# Patient Record
Sex: Female | Born: 1975
Health system: Southern US, Community
[De-identification: ages and names within clinical notes are randomized; demographics above are authoritative.]

## PROBLEM LIST (undated history)

## (undated) DIAGNOSIS — S82899A Other fracture of unspecified lower leg, initial encounter for closed fracture: Secondary | ICD-10-CM

## (undated) DIAGNOSIS — O88119 Amniotic fluid embolism in pregnancy, unspecified trimester: Secondary | ICD-10-CM

## (undated) DIAGNOSIS — N83209 Unspecified ovarian cyst, unspecified side: Secondary | ICD-10-CM

## (undated) DIAGNOSIS — D65 Disseminated intravascular coagulation [defibrination syndrome]: Secondary | ICD-10-CM

## (undated) DIAGNOSIS — G43909 Migraine, unspecified, not intractable, without status migrainosus: Secondary | ICD-10-CM

## (undated) HISTORY — PX: ARTHROSCOPIC REPAIR ACL: SUR80

## (undated) HISTORY — PX: DILATION AND CURETTAGE OF UTERUS: SHX78

## (undated) HISTORY — DX: Migraine, unspecified, not intractable, without status migrainosus: G43.909

## (undated) HISTORY — DX: Unspecified ovarian cyst, unspecified side: N83.209

## (undated) HISTORY — PX: OTHER SURGICAL HISTORY: SHX169

---

## 2006-05-14 ENCOUNTER — Ambulatory Visit: Payer: Self-pay | Admitting: Obstetrics & Gynecology

## 2006-05-18 ENCOUNTER — Ambulatory Visit (HOSPITAL_COMMUNITY): Admission: RE | Admit: 2006-05-18 | Discharge: 2006-05-18 | Payer: Self-pay | Admitting: Obstetrics & Gynecology

## 2006-05-28 ENCOUNTER — Ambulatory Visit: Payer: Self-pay | Admitting: Obstetrics & Gynecology

## 2006-06-04 ENCOUNTER — Ambulatory Visit (HOSPITAL_COMMUNITY): Admission: RE | Admit: 2006-06-04 | Discharge: 2006-06-04 | Payer: Self-pay | Admitting: Gynecology

## 2006-06-11 ENCOUNTER — Ambulatory Visit: Payer: Self-pay | Admitting: Family Medicine

## 2006-06-18 ENCOUNTER — Ambulatory Visit (HOSPITAL_COMMUNITY): Admission: RE | Admit: 2006-06-18 | Discharge: 2006-06-18 | Payer: Self-pay | Admitting: Gynecology

## 2006-07-09 ENCOUNTER — Ambulatory Visit: Payer: Self-pay | Admitting: Obstetrics & Gynecology

## 2006-07-09 ENCOUNTER — Ambulatory Visit (HOSPITAL_COMMUNITY): Admission: RE | Admit: 2006-07-09 | Discharge: 2006-07-09 | Payer: Self-pay | Admitting: Obstetrics & Gynecology

## 2006-07-09 ENCOUNTER — Encounter (INDEPENDENT_AMBULATORY_CARE_PROVIDER_SITE_OTHER): Payer: Self-pay | Admitting: Specialist

## 2006-07-09 ENCOUNTER — Ambulatory Visit: Payer: Self-pay | Admitting: Gynecology

## 2006-08-10 ENCOUNTER — Ambulatory Visit: Payer: Self-pay | Admitting: Gynecology

## 2008-01-20 ENCOUNTER — Ambulatory Visit: Payer: Self-pay | Admitting: Obstetrics & Gynecology

## 2008-01-20 ENCOUNTER — Encounter: Payer: Self-pay | Admitting: Obstetrics & Gynecology

## 2009-02-01 ENCOUNTER — Ambulatory Visit: Payer: Self-pay | Admitting: Obstetrics & Gynecology

## 2009-02-01 ENCOUNTER — Encounter: Payer: Self-pay | Admitting: Obstetrics & Gynecology

## 2009-02-05 ENCOUNTER — Ambulatory Visit (HOSPITAL_COMMUNITY): Admission: RE | Admit: 2009-02-05 | Discharge: 2009-02-05 | Payer: Self-pay | Admitting: Obstetrics & Gynecology

## 2009-03-05 ENCOUNTER — Emergency Department (HOSPITAL_COMMUNITY): Admission: EM | Admit: 2009-03-05 | Discharge: 2009-03-05 | Payer: Self-pay | Admitting: Emergency Medicine

## 2009-04-10 ENCOUNTER — Ambulatory Visit (HOSPITAL_BASED_OUTPATIENT_CLINIC_OR_DEPARTMENT_OTHER): Admission: RE | Admit: 2009-04-10 | Discharge: 2009-04-10 | Payer: Self-pay | Admitting: Specialist

## 2009-10-18 ENCOUNTER — Ambulatory Visit: Payer: Self-pay | Admitting: Obstetrics & Gynecology

## 2009-10-19 ENCOUNTER — Encounter: Payer: Self-pay | Admitting: Obstetrics & Gynecology

## 2009-10-19 LAB — CONVERTED CEMR LAB: TSH: 1.073 microintl units/mL (ref 0.350–4.500)

## 2009-10-25 ENCOUNTER — Ambulatory Visit: Payer: Self-pay | Admitting: Obstetrics and Gynecology

## 2009-10-25 ENCOUNTER — Encounter: Payer: Self-pay | Admitting: Obstetrics & Gynecology

## 2009-10-25 LAB — CONVERTED CEMR LAB
Glucose, 2 hour: 94 mg/dL (ref 70–139)
Glucose, Fasting: 89 mg/dL (ref 70–99)

## 2010-07-10 ENCOUNTER — Ambulatory Visit: Payer: Self-pay | Admitting: Obstetrics and Gynecology

## 2010-07-15 ENCOUNTER — Ambulatory Visit: Payer: Self-pay | Admitting: Obstetrics and Gynecology

## 2010-07-15 LAB — CONVERTED CEMR LAB
Antibody Screen: NEGATIVE
Basophils Relative: 0 % (ref 0–1)
Eosinophils Absolute: 0.1 10*3/uL (ref 0.0–0.7)
Eosinophils Relative: 1 % (ref 0–5)
Hemoglobin: 13.6 g/dL (ref 12.0–15.0)
Hepatitis B Surface Ag: NEGATIVE
MCHC: 32.7 g/dL (ref 30.0–36.0)
Monocytes Relative: 6 % (ref 3–12)
Neutrophils Relative %: 74 % (ref 43–77)
Rubella: 31.6 intl units/mL — ABNORMAL HIGH

## 2010-08-13 ENCOUNTER — Ambulatory Visit: Payer: Self-pay | Admitting: Family Medicine

## 2010-09-05 ENCOUNTER — Ambulatory Visit (HOSPITAL_COMMUNITY): Admission: RE | Admit: 2010-09-05 | Discharge: 2010-09-05 | Payer: Self-pay | Admitting: Obstetrics & Gynecology

## 2010-09-12 ENCOUNTER — Ambulatory Visit: Payer: Self-pay | Admitting: Obstetrics & Gynecology

## 2010-10-10 ENCOUNTER — Ambulatory Visit: Payer: Self-pay | Admitting: Obstetrics & Gynecology

## 2010-11-05 ENCOUNTER — Ambulatory Visit: Payer: Self-pay | Admitting: Family Medicine

## 2010-11-05 ENCOUNTER — Encounter: Payer: Self-pay | Admitting: Obstetrics & Gynecology

## 2010-11-05 LAB — CONVERTED CEMR LAB
HCT: 34.7 % — ABNORMAL LOW (ref 36.0–46.0)
Hemoglobin: 11 g/dL — ABNORMAL LOW (ref 12.0–15.0)
MCHC: 31.7 g/dL (ref 30.0–36.0)
WBC: 10.7 10*3/uL — ABNORMAL HIGH (ref 4.0–10.5)

## 2010-11-19 ENCOUNTER — Ambulatory Visit: Payer: Self-pay | Admitting: Obstetrics & Gynecology

## 2010-12-10 ENCOUNTER — Ambulatory Visit: Payer: Self-pay | Admitting: Obstetrics & Gynecology

## 2010-12-14 ENCOUNTER — Inpatient Hospital Stay (HOSPITAL_COMMUNITY): Admission: AD | Admit: 2010-12-14 | Payer: Self-pay | Admitting: Family Medicine

## 2010-12-15 DIAGNOSIS — D65 Disseminated intravascular coagulation [defibrination syndrome]: Secondary | ICD-10-CM

## 2010-12-15 HISTORY — DX: Disseminated intravascular coagulation (defibrination syndrome): D65

## 2010-12-26 ENCOUNTER — Ambulatory Visit
Admission: RE | Admit: 2010-12-26 | Discharge: 2010-12-26 | Payer: Self-pay | Source: Home / Self Care | Attending: Obstetrics & Gynecology | Admitting: Obstetrics & Gynecology

## 2011-01-09 ENCOUNTER — Ambulatory Visit
Admission: RE | Admit: 2011-01-09 | Discharge: 2011-01-09 | Payer: Self-pay | Source: Home / Self Care | Attending: Obstetrics & Gynecology | Admitting: Obstetrics & Gynecology

## 2011-01-09 ENCOUNTER — Inpatient Hospital Stay (HOSPITAL_COMMUNITY)
Admission: AD | Admit: 2011-01-09 | Discharge: 2011-01-11 | Payer: Self-pay | Source: Home / Self Care | Attending: Obstetrics & Gynecology | Admitting: Obstetrics & Gynecology

## 2011-01-09 LAB — CBC: MCV: 84.1 fL (ref 78.0–100.0)

## 2011-01-13 ENCOUNTER — Ambulatory Visit
Admission: RE | Admit: 2011-01-13 | Discharge: 2011-01-13 | Payer: Self-pay | Source: Home / Self Care | Admitting: Obstetrics & Gynecology

## 2011-01-14 ENCOUNTER — Ambulatory Visit: Admit: 2011-01-14 | Payer: Self-pay | Admitting: Obstetrics & Gynecology

## 2011-02-03 ENCOUNTER — Inpatient Hospital Stay (HOSPITAL_COMMUNITY)
Admission: AD | Admit: 2011-02-03 | Payer: BC Managed Care – PPO | Source: Ambulatory Visit | Admitting: Obstetrics & Gynecology

## 2011-02-25 ENCOUNTER — Ambulatory Visit: Payer: BC Managed Care – PPO | Admitting: Obstetrics and Gynecology

## 2011-03-07 NOTE — Assessment & Plan Note (Signed)
Rhonda Adams, Rhonda Adams NO.:  [id number]  MEDICAL RECORD NO.:  [id number]           PATIENT TYPE:  LOCATION:  [facility] at [facility]           FACILITY:  PHYSICIAN:  Allie Bossier, MD        DATE OF BIRTH:  Aug 22, 1976  DATE OF SERVICE:                                 CLINIC NOTE  DATE OF SERVICE:  February 25, 2011.  Rhonda Adams is a 35 year old married white G1, now P1 who is now 6 weeks postpartum status post NSVD with delivery of a 7-pound 10-ounce daughter at approximately 36-1/2 weeks estimated gestational age.  At the time of her last prenatal visit, she was 213 pounds, she now weighs 182.  She has no complaints.  She is pumping breast milk and feeding the breast milk to the baby in a bottle.  She has not had intercourse yet and thinks that she would like to have her next pregnancy in about a year. She would like to go back on birth control pills, which she used successfully in the past.  Please note that she was tried on a Progestin- only pill in the past, but did not like that due to spotting.  She denies baby blues, although she does feel some fatigue.  Baby wakes up approximately one time a night, but they do not go to bed until at least in the night, every night.  She is looking forward to returning back to work, works at the [employer].  Her bowel and bladder function have returned to normal.  She essentially has not had a period, only had occasional spot of blood since her lochia stopped.  PHYSICAL EXAMINATION:  VITAL SIGNS:  She is now 182 pounds, her blood pressure is 108/72, and her pulse is 90. GENITOURINARY:  Her vulva is well healed from her laceration.  Her bimanual exam reveals a uterus that is midplane and mobile, nontender. It is in the upper limits of normal for normal size.  Her adnexa are nontender.  No masses.  ASSESSMENT/PLAN:  Postpartum exam.  She is doing well.  I have given her a prescription for generic Imelda Pillow, to be used  on a daily basis.  She will come back in September for her annual exam, when her Pap smear is due or earlier as necessary.  I have encouraged her to continue to lose weight to reach her ideal body weight.     Allie Bossier, MD    MCD/MEDQ  D:  02/25/2011  T:  02/26/2011  Job:  [id number]

## 2011-03-26 LAB — POCT PREGNANCY, URINE: Preg Test, Ur: NEGATIVE

## 2011-04-29 NOTE — Op Note (Signed)
Rhonda Adams, DEWALT NO.:  [id number]   MEDICAL RECORD NO.:  [id number]          PATIENT TYPE:  AMB   LOCATION:  [facility]                         FACILITY:  [facility]   PHYSICIAN:  Erasmo Leventhal, M.D.DATE OF BIRTH:  [birth year]   DATE OF PROCEDURE:  04/10/2009  DATE OF DISCHARGE:                               OPERATIVE REPORT   PREOPERATIVE DIAGNOSIS:  Left knee torn anterior cruciate ligament,  possible torn meniscus.   POSTOPERATIVE DIAGNOSIS:  Left knee complete rupture of anterior  cruciate ligament tear, irreparable tear posterior horn lateral  meniscus.   PROCEDURE:  Left knee exam under anesthesia, arthroscopic assisted  allograft anterior cruciate ligament reconstruction, partial lateral  meniscectomy.   SURGEON:  Erasmo Leventhal, M.D.   ASSISTANT:  Jamelle Rushing, P.A.   ANESTHESIA:  Femoral nerve block with general, Dr. Jean Rosenthal.   ESTIMATED BLOOD LOSS:  Less than 10 mL.   DRAINS:  None.   COMPLICATIONS:  None.   TOURNIQUET TIME:  65 minutes 300 mmHg.   DISPOSITION:  PACU stable.   DRAINS:  None.   OPERATIVE DETAILS:  The patient was counseled in the holding area.  Correct side was identified.  IV was started and femoral nerve block was  administered.  Antibiotics were given on the way to the operating room.  There she was placed under general anesthesia.  All extremities well-  padded and bumped.  The left knee was examined.  She had 5 degrees  recurvatum, flexion 140.  She had only 130, she had 2+ Lachman's, soft  endpoint, 2+ anterior drawer, soft endpoint, markedly positive flexion  rotation drawer, collateral ligaments were stable to varus-valgus  stress, PCL and posterolateral corner were stable.  She was elevated.  She was prepped with DuraPrep and draped in a sterile fashion.  Exsanguinated with Esmarch, tourniquet was inflated to 300 mmHg.  Proximal medial, anteromedial and anterolateral portals were  established.   Diagnostic arthroscopy revealed the following findings,  patellofemoral joint __________ normal __________  normal tracking.  AC  was completely ruptured.  PCL was intact.  ACL fibers debrided.  Notchplasty performed in a standard fashion.  Medial side inspected.  Medial meniscus was found to be intact.  The __________ very small  partial undersurface tear posterior horn but was completely stable and  did not require intervention.  Articular cartilage was healthy.  Lateral  side healthy articular cartilage, but there was irreparable tear of the  posterior lateral meniscus in the white-white zone.  Utilizing baskets  motorized shaver smoothed down nice edge removing very small amount of  meniscal tissue.   Lateral incision made through the skin and subcutaneous tissue.  Femoral  guide was placed.  Iliotibial band was opened. Guide pin was placed  __________  the ACL footprint on the femur.  Retro reamer was attached,  retro tunnel was performed.  We had chosen 8  size appropriate allograft  on back table and Oneida Alar, PA-C prepared it.  Tibialis anterior  allograft.  Following this, small incision made __________ aspect of the  tibia.  Tibial guide  was placed.  A retro tunnel was performed.  Both  tunnels were rasped and debrided and dilated appropriately.  Knee was  copiously irrigated.  At this time the graft was moved to the knee.  I  securely fixed the femoral side with an Arthrex bioabsorbable screw  placed in parallel fashion.   Distally put the knee through range of motion.  There is no notch  impingement.  The graft had excellent __________ movement pattern.  We  allowed the graft to creep.  At this point in time __________ tibial  graft, had 30 degrees of flexion and rotation.  Pulling distally we  securely fixed it with an Arthrex bioabsorbable screw placed in parallel  fashion and supplemented that with a push lock anchor for backup  technique.  We now had a well-fixed graft  on both femoral and tibial  sides, knee had full motion, was clinically stable and graft had  excellent orientation tension.   The __________ arthroscopic equipment was removed.  On the lateral side  the IT band closed with Vicryl, subcu Vicryl, skin with subcutaneous  Vicryl suture.  Portals with nylon, __________  subcu Vicryl, skin with  __________ suture.  Another 10 mL 1% lidocaine epinephrine and 4 mg of  morphine sulfate was injected into the knee joint.  Sterile dressing  applied to the knee.  Tourniquet deflated.  Normal circulation foot and  ankle at end of case.  Another gram of Ancef was given intravenously.  TED hose was applied with an ice pack and knee immobilizer in full  extension.  She tolerated procedure well with no complications or  problems.  She is awakened taken from operating table in stable  condition.  Sponge and needle count were correct.  __________  help with  surgical technique, graft preparation throughout the entire surgical  procedure, Mr. Oneida Alar, PA-C assistance was needed.           ______________________________  Erasmo Leventhal, M.D.     RAC/MEDQ  D:  04/10/2009  T:  04/10/2009  Job:  [id number]

## 2011-04-29 NOTE — Assessment & Plan Note (Signed)
NAME:  JALYIAH, SHELLEY NO.:  [id number]   MEDICAL RECORD NO.:  [id number]          PATIENT TYPE:  POB   LOCATION:  [facility] at [facility]         [facility]ACILITY:  [facility]   PHYSICIAN:  Elsie Lincoln, MD      DATE OF BIRTH:  1976-08-08   DATE OF SERVICE:                                  CLINIC NOTE   The patient is a 35 year old female, G1 para 0-0-1-0 who presents for  her annual exam.  She has no complaints.  She did have the miscarriage  last year and D&C.  She is still trying to get pregnant but not with any  intervention. She has sex when she feels like it but is not using birth  control.  She is not on any multivitamin which I thought that she should  take just in case she becomes pregnant she has enough folic acid in her  system.  She has regular menses.  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  Her husband is an Pensions consultant and employed.   PAST MEDICAL HISTORY:  Migraines.   PAST SURGICAL HISTORY:  None.   GYN HISTORY:  No changes.  The patient again is BRCA 1 and 2 negative  after being tested from when her mother died of breast cancer.   FAMILY HISTORY:  No changes.   MEDICATIONS:  Occasional Excedrin.   ALLERGIES:  None.   REVIEW OF SYSTEMS:  Review of symptoms completely negative.  All 13  points in review are negative.   PHYSICAL EXAMINATION:  VITALS:  Pulse 92, blood pressure 111/81, weight  184, height 5 feet 5 inches.  GENERAL:  Well-nourished, well-developed. No apparent distress.  HEENT:  Normocephalic, atraumatic.  Thyroid no masses.  LUNGS:  Clear to auscultation bilaterally.  HEART:  Regular rate and rhythm.  BREASTS:  No masses, no lymphadenopathy.  Self-breast exam reviewed.  ABDOMEN:  Nontender.  No organomegaly.  No hernia.  GENITALIA:  Tanner five.  Vagina pink normal rugae.  Cervix closed,  nontender. Uterus anteverted, mobile, nontender. Adnexa no masses,  nontender.  RECTAL:   No hemorrhoids.  EXTREMITIES:  Nontender.   ASSESSMENT/PLAN:  A 35 year old female for yearly exam.  1. Pap smear.  2. Breast exam normal.  3. Start her on multivitamin daily.  4. Return to clinic in a year or sooner if needed           ______________________________  Elsie Lincoln, MD     KL/MEDQ  D:  01/20/2008  T:  01/21/2008  Job:  [id number]

## 2011-04-29 NOTE — Assessment & Plan Note (Signed)
NAME:  Rhonda Adams, KLUGH NO.:  [id number]   MEDICAL RECORD NO.:  [id number]          PATIENT TYPE:  POB   LOCATION:  [facility]         FACILITY:  [facility]   PHYSICIAN:  Allie Bossier, MD        DATE OF BIRTH:  04/09/1976   DATE OF SERVICE:                                  CLINIC NOTE   Ms. Klinger is a 35 year old married white gravida 1, para 0, abortus 1,  who comes in here for her annual exam.  She complains of some right  lower quadrant pain that is different that her usual mittelschmerz did  monthly pain.  This was associated right when her periods began and she  is curious if she might have had a miscarriage.  She said that period  was late and she had agiant clot.  Her pregnancy test today is  negative.  I have explained that there is no way to determine if she was  pregnant and has now completely lost that or not.  She is trying to  conceive.  She is on a multivitamin daily and is having unprotected  intercourse with her husband.   PAST MEDICAL HISTORY:  Mild migraine (mild migraine obesity).   PAST SURGICAL HISTORY:  She had a miscarriage and she had a D and C,  which she had a spontaneous miscarriage.   ALLERGIES:  No known drug allergies.   SOCIAL HISTORY:  She rarely drinks alcohol.   FAMILY HISTORY:  Significant for breast cancer in her mother, who was  diagnosed when her mother was pregnant with [place].  Her mother died when  Jeraldine was 66 months old.   REVIEW OF SYSTEMS:  She is married for 7 years.  She denies dyspareunia.  She is a Comptroller at [employer]  The remainder review of systems is  negative.   MEDICATIONS:  She takes a multivitamin daily and Excedrin headache  medicine as necessary.   PHYSICAL EXAMINATION:  VITAL SIGNS:  Weight 198, height 5 feet 5, blood  pressure 116/75, and pulse 75.  HEENT:  Normal.  HEART:  Regular rate and rhythm.  BREAST:  Normal bilaterally.  ABDOMEN:  No hepatosplenomegaly palpable.  EXTERNAL  GENITALIA:  No lesions.  Cervix, nulliparous.  Ectropion is  noted.  No lesions.  BIMANUAL:  Normal size and shape anteverted mobile uterus.  Her adnexa  are not particularly large and particularly tender.  She might have a  slightly increased amount of tenderness on the right.   ASSESSMENT AND PLAN:  1. Annual exam.  Checked Pap smear.  Recommended self-breast and self      vulvar exams monthly.  2. Right lower quadrant pain.  I will check an ultrasound for      reassurance for her.  She will continue her multivitamin with folic      acid.      Allie Bossier, MD     MCD/MEDQ  D:  02/01/2009  T:  02/01/2009  Job:  [id number]

## 2011-04-29 NOTE — Assessment & Plan Note (Signed)
Rhonda Adams, TERADA NO.:  [id number]   MEDICAL RECORD NO.:  [id number]          PATIENT TYPE:  POB   LOCATION:  [facility]         [facility]ACILITY[facility]  [facility]   PHYSICIAN:  Elsie Lincoln, MD      DATE OF BIRTH:  [birth year]   DATE OF SERVICE:  10/18/2009                                  CLINIC NOTE   The patient is a 35 year old female who had a positive pregnancy test at  home on October 09, 2009, and started spotting heavily on October 12, 2009, and then had a much heavier bleed and cramping on the following  day.  This is most likely the patient's third miscarriage.  The second  documented miscarriage with positive pregnancy, but she had another  undocumented one in February, but had verify similar symptoms.  The  patient desires pregnancy, but has not had a formal Reproductive  Endocrinology evaluation.  She does get pregnant, but she cannot seem to  keep them.  She also does seem to have long periods between pregnancies.  The patient does seem now more amenable  to go into the REI and still  needs a specialist for just a general consultation to see all her  options, so she can lay out her plan over the next 10 years.  I did  refer to Dr. April Manson with [facility].  Hopefully, he will  sent me back his consult letter, so I can have this in my chart.  We did  discuss the tests for diabetes, TSH, possible genetic counseling, and  antiphospholipid testing for recurrent miscarriages.  These tests were  drawn today.  I think she is coming back later to have a GTT to rule out  diabetes.  The patient is not of Ashkenazi-Jewish descent, but it would  be important to know prior to consumption.  She is [place], but not  Jewish.  The patient does not seem depressed about the recent  miscarriage, but I do think that she does not want to become pregnant  and overall options explained to her.  The patient is not due for any  health care maintenance as her last  Pap smear was on February 01, 2009,  and within normal limits.           ______________________________  Elsie Lincoln, MD     KL/MEDQ  D:  10/18/2009  T:  10/19/2009  Job:  [id number]

## 2011-05-02 NOTE — Group Therapy Note (Signed)
NAME:  Rhonda Adams, Rhonda Adams NO.:  [id number]   MEDICAL RECORD NO.:  [id number]          PATIENT TYPE:  WOC   [facility]ATION[facility]  [facility]                   FACILITY:  [facility]   PHYSICIAN:  Elsie Lincoln, MD      DATE OF BIRTH:  [birth year]   DATE OF SERVICE:                                    CLINIC NOTE   HISTORY OF PRESENT ILLNESS:  Patient is a 35 year old nulliparous female who  presents for followup of ovarian cysts.  Patient has a longstanding  infertility history, and has had several negative ultrasounds, but  complaining of right lower quadrant pain at her doctor up in [place],  and they found incidentally an irregular cystic area on the left ovary  measuring 1.3 x 1.1 x 0.8 cm as well as multiple smaller follicular cysts.  There is nothing on the right other than follicular cysts with the largest  one under 1.0 cm.  The patient wants followup of this.  She is not  interested in doing any infertility treatments at this time.  She has had a  negative hCG 2-3 years ago, and her husband had a normal semen analysis.   PAST MEDICAL HISTORY:  Migraines.   PAST SURGICAL HISTORY:  Denies.   PAST GYNECOLOGICAL HISTORY:  History of a ruptured cyst as well as problems  described above.   MEDICATIONS:  Prenatal vitamins.   ALLERGIES:  None.   FAMILY HISTORY:  Father, high blood pressure; mother, breast cancer.  She  did have the BRCA1 and 2 genetic testing done, and she is negative.   REVIEW OF SYSTEMS:  A 13-point review of systems is positive for frequent  headaches and pain with intercourse.   SOCIAL HISTORY:  Negative for smoking.  She does have an occasional drink,  and she also drinks caffeinated beverages.  She does not do drugs.  She  lives in the area with her sister who is also a patient in the practice.   PHYSICAL EXAMINATION:  ABDOMEN:  Soft, nontender, nondistended.  No rebound.  No guarding.  No hernia.  Tanner 5.  GENITALIA:  Vagina pink.  Normal rugae.   Cervix nulliparous.  Uterus normal.  Adnexa:  No masses.  Nontender.  I cannot feel any cysts or enlarged areas.  Perineum intact.   ASSESSMENT:  A 35 year old female who wants followup on a very small ovarian  cyst on the left that was done in [place] on March 06, 2006.  We will order  a transvaginal ultrasound.  The patient is to come back in 2 weeks for  results.  Her Pap smear is up to date __________  March 2007, and we have a  copy of this.           ______________________________  Elsie Lincoln, MD     KL/MEDQ  D:  05/14/2006  T:  05/14/2006  Job:  [id number]

## 2011-05-02 NOTE — Op Note (Signed)
Rhonda Adams, Rhonda Adams                 ACCOUNT NO.:  [id number]   MEDICAL RECORD NO.:  [id number]          PATIENT TYPE:  AMB   LOCATION:                                [facility]   PHYSICIAN:  Ginger Carne, MD  DATE OF BIRTH:  [birth year]   DATE OF PROCEDURE:  07/09/2006  DATE OF DISCHARGE:                                 OPERATIVE REPORT   PREOPERATIVE DIAGNOSIS:  Ten week missed abortion.   POSTOPERATIVE DIAGNOSIS:  Ten week missed abortion.   PROCEDURE:  Aspiration, dilation and extraction.   SURGEON:  Dr. Mia Creek.   ASSISTANT:  None.   COMPLICATIONS:  None immediate.   ANESTHESIA:  MAC and local paracervical block.   FINDINGS:  Products of conception, 10-week size uterus.  Uterus, tubes and  ovaries otherwise unremarkable.  Both adnexa palpable, found to be normal.   SPECIMEN:  Products of conception sent to pathology.   OPERATIVE PROCEDURE:  The patient prepped in the usual fashion and placed in  lithotomy position.  Betadine solution used for antiseptic.  The patient  catheterized prior to procedure.  After tenaculum placed on the anterior lip  of the cervix, dilatation to accommodate a #8 suction cannula was then  followed by suction curettage.  No bleeding noted at end of the procedure.  All specimen removed, sent to pathology.  The patient returned to post  anesthesia recovery room in excellent condition.      Ginger Carne, MD  Electronically Signed     SHB/MEDQ  D:  07/09/2006  T:  07/09/2006  Job:  [id number]

## 2011-05-02 NOTE — Group Therapy Note (Signed)
NAME:  Rhonda Adams, Rhonda Adams NO.:  [id number]   MEDICAL RECORD NO.:  [id number]          PATIENT TYPE:  WOC   [facility]  [facility]                   FACILITY:  [facility]   PHYSICIAN:  Elsie Lincoln, MD      DATE OF BIRTH:  [birth year]   DATE OF SERVICE:                                    CLINIC NOTE   [facility] [place]   The patient is a 35 year old female who presents with amenorrhea.  She is  noted to have a positive pregnancy test on a urine here.  This is great  news, and she has gone through fertility issues in the past.  She was also  seen here for follow up of an ovarian cyst.  She had an ultrasound done on  May 18, 2006, which shows no ovarian cyst and obviously no intrauterine  pregnancy.  She is most likely about [redacted] weeks pregnant at this point.  So, we  will wait a week to get the ultrasound.  She is given ectopic and  miscarriage precautions.  She is also given a sample of Citracal prenatal  plus DHA and advised that no smoking, drinking or drugs.  If she gets  nauseated, try B6 over-the-counter or call us if it is worse.  The patient  is obviously very excited about this and will be coming back for an OB in  the next several weeks if it is a viable IUP.  If not, she will come back  sooner for workup of missed AB or an ectopic.           ______________________________  Elsie Lincoln, MD     KL/MEDQ  D:  05/28/2006  T:  05/28/2006  Job:  [id number]

## 2011-06-11 ENCOUNTER — Inpatient Hospital Stay (INDEPENDENT_AMBULATORY_CARE_PROVIDER_SITE_OTHER)
Admission: RE | Admit: 2011-06-11 | Discharge: 2011-06-11 | Disposition: A | Discharge status: Home / Self Care | Payer: BC Managed Care – PPO | Source: Ambulatory Visit | Attending: Family Medicine | Admitting: Family Medicine

## 2011-06-11 DIAGNOSIS — J069 Acute upper respiratory infection, unspecified: Secondary | ICD-10-CM

## 2012-04-27 ENCOUNTER — Other Ambulatory Visit: Payer: Self-pay | Admitting: Obstetrics & Gynecology

## 2012-04-29 NOTE — Telephone Encounter (Signed)
Called Rx

## 2012-06-11 ENCOUNTER — Other Ambulatory Visit: Payer: Self-pay | Admitting: *Deleted

## 2012-06-11 MED ORDER — FOLIC ACID 1 MG PO TABS: ORAL_TABLET | ORAL | Status: DC

## 2012-06-11 NOTE — Telephone Encounter (Signed)
Pharmacy requested refill of folic acid.

## 2012-07-23 ENCOUNTER — Ambulatory Visit (INDEPENDENT_AMBULATORY_CARE_PROVIDER_SITE_OTHER): Payer: BC Managed Care – PPO | Admitting: Obstetrics & Gynecology

## 2012-07-23 ENCOUNTER — Encounter: Payer: Self-pay | Admitting: Obstetrics & Gynecology

## 2012-07-23 VITALS — BP 102/68 | Wt 206.0 lb

## 2012-07-23 DIAGNOSIS — Z124 Encounter for screening for malignant neoplasm of cervix: Secondary | ICD-10-CM

## 2012-07-23 DIAGNOSIS — G43909 Migraine, unspecified, not intractable, without status migrainosus: Secondary | ICD-10-CM | POA: Insufficient documentation

## 2012-07-23 DIAGNOSIS — O09529 Supervision of elderly multigravida, unspecified trimester: Secondary | ICD-10-CM

## 2012-07-23 DIAGNOSIS — N83209 Unspecified ovarian cyst, unspecified side: Secondary | ICD-10-CM | POA: Insufficient documentation

## 2012-07-23 DIAGNOSIS — E669 Obesity, unspecified: Secondary | ICD-10-CM

## 2012-07-23 DIAGNOSIS — O209 Hemorrhage in early pregnancy, unspecified: Secondary | ICD-10-CM

## 2012-07-23 DIAGNOSIS — Z3480 Encounter for supervision of other normal pregnancy, unspecified trimester: Secondary | ICD-10-CM

## 2012-07-23 DIAGNOSIS — Z113 Encounter for screening for infections with a predominantly sexual mode of transmission: Secondary | ICD-10-CM

## 2012-07-23 DIAGNOSIS — O99210 Obesity complicating pregnancy, unspecified trimester: Secondary | ICD-10-CM

## 2012-07-23 DIAGNOSIS — O4690 Antepartum hemorrhage, unspecified, unspecified trimester: Secondary | ICD-10-CM

## 2012-07-23 NOTE — Progress Notes (Signed)
   Subjective:    Rhonda Adams is a G2P1 [redacted]w[redacted]d being seen today for her first obstetrical visit.  Her obstetrical history is significant for advanced maternal age, obesity in pregnancy, and h/o multiple miscarriages. She has also seen some pink "strings" with wiping on 3 occasions last week. She denies cramping. Patient does intend to breast feed. Pregnancy history fully reviewed.  Patient reports as above.  There were no vitals filed for this visit.  HISTORY: OB History    Grav Para Term Preterm Abortions TAB SAB Ect Mult Living   2 1             # Outc Date GA Lbr Len/2nd Wgt Sex Del Anes PTL Lv   1 CUR            2 PAR              Past Medical History  Diagnosis Date  . Ovarian cyst   . Migraine    Past Surgical History  Procedure Date  . Dilation and curettage of uterus   . Miscarriage    Family History  Problem Relation Age of Onset  . Cancer Mother     BREAST     Exam    Uterus:     Pelvic Exam:    Perineum: No Hemorrhoids   Vulva: normal   Vagina:  normal mucosa   pH:    Cervix: anteverted and ectropion noted   Adnexa: normal adnexa   Bony Pelvis: android  System: Breast:  normal appearance, no masses or tenderness   Skin: normal coloration and turgor, no rashes    Neurologic: oriented   Extremities: normal strength, tone, and muscle mass   HEENT PERRLA   Mouth/Teeth mucous membranes moist, pharynx normal without lesions   Neck supple   Cardiovascular: regular rate and rhythm   Respiratory:  appears well, vitals normal, no respiratory distress, acyanotic, normal RR, ear and throat exam is normal, neck free of mass or lymphadenopathy, chest clear, no wheezing, crepitations, rhonchi, normal symmetric air entry   Abdomen: soft, non-tender; bowel sounds normal; no masses,  no organomegaly   Urinary: urethral meatus normal      Assessment:    Pregnancy: G2P1 Patient Active Problem List  Diagnosis  . Ovarian cyst  . Migraine        Plan:      Initial labs drawn. Prenatal vitamins. Problem list reviewed. Genetic Screening discussed First Screen and Quad Screen: undecided. I have given her written information about the Harmony test. She thinks that they will decline. She is interested in having a consult with a nutritionist. I will get an u/s due to her h/o miscarriages and now seeing "pink" with wiping.  Ultrasound discussed; fetal survey: requested.  Follow up in 4 weeks.  Tulsi Crossett C. 07/23/2012

## 2012-07-24 LAB — OBSTETRIC PANEL
Basophils Relative: 1 % (ref 0–1)
Eosinophils Relative: 2 % (ref 0–5)
Hepatitis B Surface Ag: NEGATIVE
Lymphocytes Relative: 30 % (ref 12–46)
MCV: 81.9 fL (ref 78.0–100.0)
Monocytes Absolute: 0.3 10*3/uL (ref 0.1–1.0)
RBC: 4.91 MIL/uL (ref 3.87–5.11)
Rh Type: POSITIVE
WBC: 2.6 10*3/uL — ABNORMAL LOW (ref 4.0–10.5)

## 2012-07-30 ENCOUNTER — Other Ambulatory Visit: Payer: Self-pay | Admitting: Obstetrics & Gynecology

## 2012-07-30 ENCOUNTER — Ambulatory Visit (HOSPITAL_COMMUNITY)
Admission: RE | Admit: 2012-07-30 | Discharge: 2012-07-30 | Disposition: A | Discharge status: Home / Self Care | Payer: BC Managed Care – PPO | Source: Ambulatory Visit | Attending: Obstetrics & Gynecology | Admitting: Obstetrics & Gynecology

## 2012-07-30 DIAGNOSIS — O209 Hemorrhage in early pregnancy, unspecified: Secondary | ICD-10-CM | POA: Insufficient documentation

## 2012-07-30 DIAGNOSIS — O3680X0 Pregnancy with inconclusive fetal viability, not applicable or unspecified: Secondary | ICD-10-CM | POA: Insufficient documentation

## 2012-07-30 DIAGNOSIS — O30009 Twin pregnancy, unspecified number of placenta and unspecified number of amniotic sacs, unspecified trimester: Secondary | ICD-10-CM | POA: Insufficient documentation

## 2012-07-30 DIAGNOSIS — O2620 Pregnancy care for patient with recurrent pregnancy loss, unspecified trimester: Secondary | ICD-10-CM | POA: Insufficient documentation

## 2012-08-11 ENCOUNTER — Ambulatory Visit (INDEPENDENT_AMBULATORY_CARE_PROVIDER_SITE_OTHER): Payer: BC Managed Care – PPO | Admitting: Obstetrics & Gynecology

## 2012-08-11 VITALS — BP 134/86 | Wt 205.0 lb

## 2012-08-11 DIAGNOSIS — IMO0002 Reserved for concepts with insufficient information to code with codable children: Secondary | ICD-10-CM

## 2012-08-11 DIAGNOSIS — O2620 Pregnancy care for patient with recurrent pregnancy loss, unspecified trimester: Secondary | ICD-10-CM | POA: Insufficient documentation

## 2012-08-11 DIAGNOSIS — O021 Missed abortion: Secondary | ICD-10-CM

## 2012-08-11 NOTE — Progress Notes (Signed)
Transvaginal US shows collapsing gestational sac A and sac B with fetus CRL 7 weeks 4 days no FHM. Light bleeding noted. Discussed expectant management v. D&C or Cytotec, risks and benefits. She will consider options. Dx fetal demise.

## 2012-08-11 NOTE — Patient Instructions (Signed)
Miscarriage (Spontaneous Miscarriage)  A miscarriage is when you lose your baby before the twentieth week of pregnancy. Miscarriages happen in 15-20% of pregnancies. Most miscarriages happen in the first 13 weeks of the pregnancy. In medical terms, this is called a spontaneous miscarriage or early pregnancy loss. No further treatment is needed when the miscarriage is complete and all products of conception have been passed out of the body. You can begin trying for another pregnancy as soon as your caregiver says it is okay.  CAUSES    Most causes are not known.   Genetic problems like abnormal, not enough or too many chromosomes.   Infection of the cervix or uterus.   An abnormal shaped uterus, fibroid tumors or congenital abnormalities.   Hormone problems.   Medical problems.   Incompetent cervix, the tissue in the cervix is not strong enough to hold the pregnancy.   Smoking, too much alcohol use and illegal drugs.   Trauma.  SYMPTOMS    Bleeding or spotting from the vagina.   Cramping of the lower abdomen.   Passing of fluid from the vagina with or without cramps or pain.   Passing fetal tissue.  TREATMENT    Sometimes no further treatment is necessary if you pass all the tissue in the uterus.   If partial parts of the fetus or placenta remain in the body (incomplete miscarriage), tissue left behind may become infected. Usually a D and C (Dilatation and Curettage) suction or scrapping of the uterus is necessary to remove the remaining tissue in uterus. The procedure is only done when your caregiver knows that there is no chance for the pregnancy to continue. This is determined by a physical exam, a negative pregnancy test, blood tests and perhaps an ultrasound revealing a dead fetus or no fetus developing because a problem occurred at conception (when the sperm and egg unite).   Medications may be necessary, antibiotics if there is an infection or medications to contract the uterus if there is a  lot of bleeding.   If you have Rh negative blood and your partner is Rh positive, you will need a Rho-gam shot (an immune globulin vaccine). This will protect your baby from having Rh blood problems in future pregnancies.  HOME CARE INSTRUCTIONS    Your caregiver may order bed rest (up to the bathroom only). He or she may allow you to continue light activity. You may need to make arrangements for the care of children and for any other responsibilities.   Keep track of the number of pads you use each day and how soaked (saturated) they are. Record this information.   Do not use tampons. Do not douche or have sexual intercourse until approved by your caregiver.   Only take over-the-counter or prescription medicines for pain, discomfort or fever as directed by your caregiver.   Do not take aspirin because it can cause bleeding.   It is very important to keep all follow-up appointments for re-evaluations and continuing management.   Tell your caregiver if you are experiencing domestic violence.   Women who have an Rh negative blood type (i.e., A, B, AB, or O negative) need to receive a drug called Rh(D) immune globulin (RhoGam). This medicine helps protect future fetuses against problems that can occur if an Rh negative mother is carrying a baby who is Rh positive.   If you and/or your partner are having problems with guilt or grieving, talk to your caregiver or seek counseling to help   you cope with the pregnancy loss. Allow enough time to grieve before trying to get pregnant again.  SEEK IMMEDIATE MEDICAL CARE IF:    You have severe cramps or pain in your stomach, back, or belly (abdomen).   You have a fever.   You pass large clots or tissue. Save any tissue for your caregiver to inspect.   Your bleeding increases.   You become light-headed, weak or have fainting episodes.   You develop chills.  Document Released: 05/27/2001 Document Revised: 11/20/2011 Document Reviewed: 07/03/2008  [employer] Patient  Information 2012 [employer]

## 2012-08-11 NOTE — Progress Notes (Signed)
Started increased spotting yesterday and it turned into bright red bleeding.  Confirmation ultrasound showed two sacs but only one heart rate.

## 2012-08-17 ENCOUNTER — Ambulatory Visit (INDEPENDENT_AMBULATORY_CARE_PROVIDER_SITE_OTHER): Payer: BC Managed Care – PPO | Admitting: Obstetrics & Gynecology

## 2012-08-17 ENCOUNTER — Encounter: Payer: Self-pay | Admitting: Obstetrics & Gynecology

## 2012-08-17 ENCOUNTER — Other Ambulatory Visit: Payer: BC Managed Care – PPO

## 2012-08-17 VITALS — BP 108/57 | HR 85 | Ht 65.0 in | Wt 207.0 lb

## 2012-08-17 DIAGNOSIS — O039 Complete or unspecified spontaneous abortion without complication: Secondary | ICD-10-CM

## 2012-08-17 NOTE — Progress Notes (Signed)
  Subjective:    Patient ID: Rhonda Adams, female    DOB: January 18, 1976, 36 y.o.   MRN: [id number]  HPI  Rhonda Adams is now 3 days status post complete miscarriage. Her bleeding is now "light". Her pain was controlled with IBU. She and her husband would like to have another pregnancy asap. They understand the benefits to waiting for a few cycles before attempting conception.   Review of Systems     Objective:   Physical Exam        Assessment & Plan:   Complete spontaneous AB Recheck QBHCG in a month

## 2012-08-19 ENCOUNTER — Encounter: Payer: BC Managed Care – PPO | Admitting: Obstetrics & Gynecology

## 2012-08-24 ENCOUNTER — Ambulatory Visit: Payer: BC Managed Care – PPO | Admitting: Dietician

## 2012-11-05 ENCOUNTER — Other Ambulatory Visit: Payer: Self-pay | Admitting: Obstetrics & Gynecology

## 2012-12-15 NOTE — L&D Delivery Note (Signed)
Delivery Note Called shortly after AROM for FHR deceleration- RN interventions in place (fluid bolus, O2 via NRBM, maternal position changes). FHR in 70s. SVE 10/100/+2 to +3. Pt very lethargic, encouraged to push, pt developed mild nosebleed Rt nostril, baby was not forthcoming, called Dr. Debroah Loop and NICU to come, right after Dr. Debroah Loop arrived in room pt's pushes became more effective and at 3:13 AM a viable female was delivered via Vaginal, Spontaneous Delivery (Presentation: Direct OA to Left Occiput Anterior). Infant w/ poor tone/respiratory effort briefly placed on maternal abdomen, not responding to stimulation. Cord clamped x 2 and cut and taken immediately to radiant warmer for resuscitation by NICU team- please refer to their notes.  Arterial cord gas obtained.  APGAR: 2, 5; weight 7 lb 0.5 oz (3188 g).   Immediately after birth, pt very diaphoretic and lethargic, although oriented and would open eyes briefly, nose bleed had stopped. No bleeding from IV or any other orifice, other than scant lochia while awaiting placenta. Pitocin bolus per pp protocol was initiated while awaiting placenta. Pt's O2 sats noted to be in low 80s on RA, O2 via NRBM replaced and Dr. Renold Don (anesthesia) called to room stat to evaluate pt. O2 sats increased to only high 80s on NRBM, Dr.Germeroth in to evaluate pt. Pt still lethargic, but oriented. Placenta status: delivered, spontaneoulsy via controlled cord traction w/ ~400cc gush of blood immediately following placenta.  Cord: 3 vessels with the following complications: Hyperspiraled.  Cytotec given pr, fundus firmed up and bleeding subsided briefly.  Uterus again became boggy and had large gush of blood.  Methergine was given IM and PPH protocol was initiated (Dr. Debroah Loop, myself, Dr. Renold Don, Azalia Bilis coverage, charge RN, rapid respone RN, pt's RN, lab, RT already at pt's bedside).  Foley catheter placed w/ immediate return of moderate amount pink-tinged  urine.  Bleeding would subside briefly, and then would increase again.  She received a dose of Hemabate IM, as well as Pitocin 20units IM. CRNA in to initiate rapid infusion of PRBC. Bimanual compression performed, until Bakri balloon placed by Dr. Debroah Loop. Pt had continued oozing, fundal massage performed, and upon inspection Bakri balloon was noted to be extrauterine.  Pt then taken to OR for placement of Bakri balloon and ART line.    Anesthesia:  Epidural Episiotomy: None Lacerations: 1st degree;Perineal, hemostatic, no repair needed Suture Repair: n/a Est. Blood Loss (mL): 2000  Presumed Amniotic Fluid Embolism w/ severe PPH  Mom to OR.  Baby to NICU. Plans to breastfeed, POPs for contraception.   Marge Duncans 09/04/2013, 5:14 AM

## 2013-02-09 ENCOUNTER — Other Ambulatory Visit: Payer: Self-pay | Admitting: Obstetrics & Gynecology

## 2013-02-28 ENCOUNTER — Ambulatory Visit (INDEPENDENT_AMBULATORY_CARE_PROVIDER_SITE_OTHER): Payer: BC Managed Care – PPO | Admitting: *Deleted

## 2013-02-28 ENCOUNTER — Encounter: Payer: Self-pay | Admitting: *Deleted

## 2013-02-28 DIAGNOSIS — Z3480 Encounter for supervision of other normal pregnancy, unspecified trimester: Secondary | ICD-10-CM

## 2013-02-28 DIAGNOSIS — O09529 Supervision of elderly multigravida, unspecified trimester: Secondary | ICD-10-CM

## 2013-02-28 NOTE — Progress Notes (Signed)
Mother has Mtfhr gene mutation

## 2013-03-01 LAB — OBSTETRIC PANEL
Antibody Screen: NEGATIVE
HCT: 39.9 % (ref 36.0–46.0)
Hemoglobin: 13.5 g/dL (ref 12.0–15.0)
Lymphocytes Relative: 26 % (ref 12–46)
MCV: 84.5 fL (ref 78.0–100.0)
Monocytes Absolute: 0.3 10*3/uL (ref 0.1–1.0)
Monocytes Relative: 6 % (ref 3–12)
Neutro Abs: 3.7 10*3/uL (ref 1.7–7.7)
Rh Type: POSITIVE
Rubella: 1.14 Index — ABNORMAL HIGH (ref <0.90)
WBC: 5.6 10*3/uL (ref 4.0–10.5)

## 2013-03-17 ENCOUNTER — Encounter: Payer: Self-pay | Admitting: Obstetrics & Gynecology

## 2013-03-17 ENCOUNTER — Ambulatory Visit (INDEPENDENT_AMBULATORY_CARE_PROVIDER_SITE_OTHER): Payer: BC Managed Care – PPO | Admitting: Obstetrics & Gynecology

## 2013-03-17 VITALS — BP 130/100 | Wt 204.0 lb

## 2013-03-17 DIAGNOSIS — O09299 Supervision of pregnancy with other poor reproductive or obstetric history, unspecified trimester: Secondary | ICD-10-CM

## 2013-03-17 DIAGNOSIS — O09529 Supervision of elderly multigravida, unspecified trimester: Secondary | ICD-10-CM

## 2013-03-17 DIAGNOSIS — O161 Unspecified maternal hypertension, first trimester: Secondary | ICD-10-CM

## 2013-03-17 DIAGNOSIS — O09211 Supervision of pregnancy with history of pre-term labor, first trimester: Secondary | ICD-10-CM

## 2013-03-17 DIAGNOSIS — O2620 Pregnancy care for patient with recurrent pregnancy loss, unspecified trimester: Secondary | ICD-10-CM

## 2013-03-17 DIAGNOSIS — D689 Coagulation defect, unspecified: Secondary | ICD-10-CM

## 2013-03-17 DIAGNOSIS — O09219 Supervision of pregnancy with history of pre-term labor, unspecified trimester: Secondary | ICD-10-CM | POA: Insufficient documentation

## 2013-03-17 DIAGNOSIS — O2621 Pregnancy care for patient with recurrent pregnancy loss, first trimester: Secondary | ICD-10-CM

## 2013-03-17 DIAGNOSIS — Z139 Encounter for screening, unspecified: Secondary | ICD-10-CM

## 2013-03-17 DIAGNOSIS — O9912 Other diseases of the blood and blood-forming organs and certain disorders involving the immune mechanism complicating childbirth: Secondary | ICD-10-CM

## 2013-03-17 DIAGNOSIS — E7212 Methylenetetrahydrofolate reductase deficiency: Secondary | ICD-10-CM | POA: Insufficient documentation

## 2013-03-17 DIAGNOSIS — O139 Gestational [pregnancy-induced] hypertension without significant proteinuria, unspecified trimester: Secondary | ICD-10-CM

## 2013-03-17 DIAGNOSIS — O169 Unspecified maternal hypertension, unspecified trimester: Secondary | ICD-10-CM | POA: Insufficient documentation

## 2013-03-17 DIAGNOSIS — Z113 Encounter for screening for infections with a predominantly sexual mode of transmission: Secondary | ICD-10-CM

## 2013-03-17 DIAGNOSIS — O09521 Supervision of elderly multigravida, first trimester: Secondary | ICD-10-CM

## 2013-03-17 NOTE — Progress Notes (Signed)
P - 103 - Pt states she had some vaginal bleeding around week 5 or 6 when she had a bowel movement - no bleeding since then - Pt states she is concerned about what could happen to this pregnancy if she is in a stressful situation

## 2013-03-17 NOTE — Progress Notes (Signed)
   Subjective:    Rhonda Adams is Adams [id number] [redacted]w[redacted]d being seen today for her first obstetrical visit.  Her obstetrical history is significant for the problems below, see problem list below. Patient reports no complaints.  Patient Active Problem List  Diagnosis  . Ovarian cyst  . Migraine  . AMA (advanced maternal age) multigravida 35+  . Obesity in pregnancy  . Pregnancy complicated by previous recurrent miscarriages  . MTHFR homozygous C677T mutation complicating pregnancy  . Elevated blood pressure complicating pregnancy, antepartum  . Previous preterm delivery, antepartum     Filed Vitals:   03/17/13 0843  BP: 130/100  Weight: 204 lb (92.534 kg)    HISTORY: OB History   Grav Para Term Preterm Abortions TAB SAB Ect Mult Living   6 1 0 1 4 0 4 0 0 1      # Outc Date GA Lbr Len/2nd Wgt Sex Del Anes PTL Lv   1 SAB 2005 [id number]          2 SAB 2007 [redacted]w[redacted]d          Comments: D&C   3 SAB 2010 [redacted]w[redacted]d          4 PRE 2012 [redacted]w[redacted]d    SVD      5 SAB 2013 [redacted]w[redacted]d          6 CUR              Past Medical History  Diagnosis Date  . Ovarian cyst   . Migraine    Past Surgical History  Procedure Laterality Date  . Dilation and curettage of uterus    . Miscarriage     Family History  Problem Relation Age of Onset  . Cancer Mother 45    BREAST    Exam    Uterus:   FHR 165  Pelvic Exam: Deferred  System: Breast:  Deferred   Skin: normal coloration and turgor, no rashes    Neurologic: oriented, normal, negative   Extremities: normal strength, tone, and muscle mass   HEENT PERRLA   Mouth/Teeth mucous membranes moist, pharynx normal without lesions and dental hygiene good   Neck supple and no masses   Cardiovascular: regular rate and rhythm   Respiratory:  appears well, vitals normal, no respiratory distress, acyanotic, normal RR   Abdomen: soft, non-tender; bowel sounds normal; no masses,  no organomegaly      Assessment:    Pregnancy: [id number] Patient Active Problem List   Diagnosis  . Ovarian cyst  . Migraine  . AMA (advanced maternal age) multigravida 35+  . Obesity in pregnancy  . Pregnancy complicated by previous recurrent miscarriages  . MTHFR homozygous C677T mutation complicating pregnancy  . Elevated blood pressure complicating pregnancy, antepartum  . Previous preterm delivery, antepartum     Plan:   Initial labs drawn. For her MTHFR mutation, will continue prenatal vitamins, folic acid 4mg  qd, B6 50 mg qd.  No need for anticoagulation, verified with Dr. Sherrie George (MFM) Problem list reviewed and updated. Genetic Screening discussed; considering [facility]. Ultrasound discussed; fetal survey: ordered. Follow up in 2 weeks for BP check given elevated DBP of 100 mmHg, rule out CHTN.  Rhonda Adams 03/17/2013

## 2013-03-18 LAB — GC/CHLAMYDIA PROBE AMP: GC Probe RNA: NEGATIVE

## 2013-03-31 ENCOUNTER — Other Ambulatory Visit: Payer: Self-pay | Admitting: Obstetrics & Gynecology

## 2013-03-31 ENCOUNTER — Ambulatory Visit: Payer: BC Managed Care – PPO | Admitting: *Deleted

## 2013-03-31 VITALS — BP 77/50

## 2013-03-31 DIAGNOSIS — Z0489 Encounter for examination and observation for other specified reasons: Secondary | ICD-10-CM

## 2013-03-31 DIAGNOSIS — Z3481 Encounter for supervision of other normal pregnancy, first trimester: Secondary | ICD-10-CM

## 2013-03-31 NOTE — Progress Notes (Signed)
Patient is here today for BP recheck it was high at her last visit.  She is feeling much better now and her BP looks good today.  She will follow up at her next regular appointment.

## 2013-04-01 ENCOUNTER — Encounter: Payer: BC Managed Care – PPO | Admitting: Obstetrics & Gynecology

## 2013-05-04 ENCOUNTER — Ambulatory Visit (HOSPITAL_COMMUNITY): Payer: BC Managed Care – PPO

## 2013-05-04 ENCOUNTER — Telehealth: Payer: Self-pay | Admitting: *Deleted

## 2013-05-04 DIAGNOSIS — O09521 Supervision of elderly multigravida, first trimester: Secondary | ICD-10-CM

## 2013-05-04 NOTE — Telephone Encounter (Signed)
Radiology called requesting a paper copy of patients anatomy scan order because she can't see it on her end.  Paper copy printed and sent to [phone].

## 2013-05-05 ENCOUNTER — Ambulatory Visit (HOSPITAL_COMMUNITY)
Admission: RE | Admit: 2013-05-05 | Discharge: 2013-05-05 | Disposition: A | Discharge status: Home / Self Care | Payer: BC Managed Care – PPO | Source: Ambulatory Visit | Attending: Obstetrics & Gynecology | Admitting: Obstetrics & Gynecology

## 2013-05-05 DIAGNOSIS — E7212 Methylenetetrahydrofolate reductase deficiency: Secondary | ICD-10-CM

## 2013-05-05 DIAGNOSIS — E669 Obesity, unspecified: Secondary | ICD-10-CM | POA: Insufficient documentation

## 2013-05-05 DIAGNOSIS — O162 Unspecified maternal hypertension, second trimester: Secondary | ICD-10-CM

## 2013-05-05 DIAGNOSIS — O2622 Pregnancy care for patient with recurrent pregnancy loss, second trimester: Secondary | ICD-10-CM

## 2013-05-05 DIAGNOSIS — O2620 Pregnancy care for patient with recurrent pregnancy loss, unspecified trimester: Secondary | ICD-10-CM | POA: Insufficient documentation

## 2013-05-05 DIAGNOSIS — O09529 Supervision of elderly multigravida, unspecified trimester: Secondary | ICD-10-CM | POA: Insufficient documentation

## 2013-05-05 DIAGNOSIS — O09212 Supervision of pregnancy with history of pre-term labor, second trimester: Secondary | ICD-10-CM

## 2013-05-05 DIAGNOSIS — Z8751 Personal history of pre-term labor: Secondary | ICD-10-CM | POA: Insufficient documentation

## 2013-05-05 DIAGNOSIS — Z3689 Encounter for other specified antenatal screening: Secondary | ICD-10-CM | POA: Insufficient documentation

## 2013-05-05 DIAGNOSIS — Z0489 Encounter for examination and observation for other specified reasons: Secondary | ICD-10-CM

## 2013-05-12 ENCOUNTER — Other Ambulatory Visit: Payer: Self-pay | Admitting: Obstetrics & Gynecology

## 2013-05-17 ENCOUNTER — Encounter: Payer: Self-pay | Admitting: Obstetrics & Gynecology

## 2013-05-24 ENCOUNTER — Ambulatory Visit (INDEPENDENT_AMBULATORY_CARE_PROVIDER_SITE_OTHER): Payer: BC Managed Care – PPO | Admitting: Family Medicine

## 2013-05-24 VITALS — BP 98/75 | Wt 210.0 lb

## 2013-05-24 DIAGNOSIS — O09212 Supervision of pregnancy with history of pre-term labor, second trimester: Secondary | ICD-10-CM

## 2013-05-24 DIAGNOSIS — O09522 Supervision of elderly multigravida, second trimester: Secondary | ICD-10-CM

## 2013-05-24 DIAGNOSIS — O09219 Supervision of pregnancy with history of pre-term labor, unspecified trimester: Secondary | ICD-10-CM

## 2013-05-24 DIAGNOSIS — O09529 Supervision of elderly multigravida, unspecified trimester: Secondary | ICD-10-CM

## 2013-05-24 NOTE — Patient Instructions (Signed)
Pregnancy - Second Trimester The second trimester of pregnancy (3 to 6 months) is a period of rapid growth for you and your baby. At the end of the sixth month, your baby is about 9 inches long and weighs 1 1/2 pounds. You will begin to feel the baby move between 18 and 20 weeks of the pregnancy. This is called quickening. Weight gain is faster. A clear fluid (colostrum) may leak out of your breasts. You may feel small contractions of the womb (uterus). This is known as false labor or Braxton-Hicks contractions. This is like a practice for labor when the baby is ready to be born. Usually, the problems with morning sickness have usually passed by the end of your first trimester. Some women develop small dark blotches (called cholasma, mask of pregnancy) on their face that usually goes away after the baby is born. Exposure to the sun makes the blotches worse. Acne may also develop in some pregnant women and pregnant women who have acne, may find that it goes away. PRENATAL EXAMS  Blood work may continue to be done during prenatal exams. These tests are done to check on your health and the probable health of your baby. Blood work is used to follow your blood levels (hemoglobin). Anemia (low hemoglobin) is common during pregnancy. Iron and vitamins are given to help prevent this. You will also be checked for diabetes between 24 and 28 weeks of the pregnancy. Some of the previous blood tests may be repeated.  The size of the uterus is measured during each visit. This is to make sure that the baby is continuing to grow properly according to the dates of the pregnancy.  Your blood pressure is checked every prenatal visit. This is to make sure you are not getting toxemia.  Your urine is checked to make sure you do not have an infection, diabetes or protein in the urine.  Your weight is checked often to make sure gains are happening at the suggested rate. This is to ensure that both you and your baby are  growing normally.  Sometimes, an ultrasound is performed to confirm the proper growth and development of the baby. This is a test which bounces harmless sound waves off the baby so your caregiver can more accurately determine due dates. Sometimes, a test is done on the amniotic fluid surrounding the baby. This test is called an amniocentesis. The amniotic fluid is obtained by sticking a needle into the belly (abdomen). This is done to check the chromosomes in instances where there is a concern about possible genetic problems with the baby. It is also sometimes done near the end of pregnancy if an early delivery is required. In this case, it is done to help make sure the baby's lungs are mature enough for the baby to live outside of the womb. CHANGES OCCURING IN THE SECOND TRIMESTER OF PREGNANCY Your body goes through many changes during pregnancy. They vary from person to person. Talk to your caregiver about changes you notice that you are concerned about.  During the second trimester, you will likely have an increase in your appetite. It is normal to have cravings for certain foods. This varies from person to person and pregnancy to pregnancy.  Your lower abdomen will begin to bulge.  You may have to urinate more often because the uterus and baby are pressing on your bladder. It is also common to get more bladder infections during pregnancy. You can help this by drinking lots of fluids   and emptying your bladder before and after intercourse.  You may begin to get stretch marks on your hips, abdomen, and breasts. These are normal changes in the body during pregnancy. There are no exercises or medicines to take that prevent this change.  You may begin to develop swollen and bulging veins (varicose veins) in your legs. Wearing support hose, elevating your feet for 15 minutes, 3 to 4 times a day and limiting salt in your diet helps lessen the problem.  Heartburn may develop as the uterus grows and  pushes up against the stomach. Antacids recommended by your caregiver helps with this problem. Also, eating smaller meals 4 to 5 times a day helps.  Constipation can be treated with a stool softener or adding bulk to your diet. Drinking lots of fluids, and eating vegetables, fruits, and whole grains are helpful.  Exercising is also helpful. If you have been very active up until your pregnancy, most of these activities can be continued during your pregnancy. If you have been less active, it is helpful to start an exercise program such as walking.  Hemorrhoids may develop at the end of the second trimester. Warm sitz baths and hemorrhoid cream recommended by your caregiver helps hemorrhoid problems.  Backaches may develop during this time of your pregnancy. Avoid heavy lifting, wear low heal shoes, and practice good posture to help with backache problems.  Some pregnant women develop tingling and numbness of their hand and fingers because of swelling and tightening of ligaments in the wrist (carpel tunnel syndrome). This goes away after the baby is born.  As your breasts enlarge, you may have to get a bigger bra. Get a comfortable, cotton, support bra. Do not get a nursing bra until the last month of the pregnancy if you will be nursing the baby.  You may get a dark line from your belly button to the pubic area called the linea nigra.  You may develop rosy cheeks because of increase blood flow to the face.  You may develop spider looking lines of the face, neck, arms, and chest. These go away after the baby is born. HOME CARE INSTRUCTIONS   It is extremely important to avoid all smoking, herbs, alcohol, and unprescribed drugs during your pregnancy. These chemicals affect the formation and growth of the baby. Avoid these chemicals throughout the pregnancy to ensure the delivery of a healthy infant.  Most of your home care instructions are the same as suggested for the first trimester of your  pregnancy. Keep your caregiver's appointments. Follow your caregiver's instructions regarding medicine use, exercise, and diet.  During pregnancy, you are providing food for you and your baby. Continue to eat regular, well-balanced meals. Choose foods such as meat, fish, milk and other low fat dairy products, vegetables, fruits, and whole-grain breads and cereals. Your caregiver will tell you of the ideal weight gain.  A physical sexual relationship may be continued up until near the end of pregnancy if there are no other problems. Problems could include early (premature) leaking of amniotic fluid from the membranes, vaginal bleeding, abdominal pain, or other medical or pregnancy problems.  Exercise regularly if there are no restrictions. Check with your caregiver if you are unsure of the safety of some of your exercises. The greatest weight gain will occur in the last 2 trimesters of pregnancy. Exercise will help you:  Control your weight.  Get you in shape for labor and delivery.  Lose weight after you have the baby.  Wear   a good support or jogging bra for breast tenderness during pregnancy. This may help if worn during sleep. Pads or tissues may be used in the bra if you are leaking colostrum.  Do not use hot tubs, steam rooms or saunas throughout the pregnancy.  Wear your seat belt at all times when driving. This protects you and your baby if you are in an accident.  Avoid raw meat, uncooked cheese, cat litter boxes, and soil used by cats. These carry germs that can cause birth defects in the baby.  The second trimester is also a good time to visit your dentist for your dental health if this has not been done yet. Getting your teeth cleaned is okay. Use a soft toothbrush. Brush gently during pregnancy.  It is easier to leak urine during pregnancy. Tightening up and strengthening the pelvic muscles will help with this problem. Practice stopping your urination while you are going to the  bathroom. These are the same muscles you need to strengthen. It is also the muscles you would use as if you were trying to stop from passing gas. You can practice tightening these muscles up 10 times a set and repeating this about 3 times per day. Once you know what muscles to tighten up, do not perform these exercises during urination. It is more likely to contribute to an infection by backing up the urine.  Ask for help if you have financial, counseling, or nutritional needs during pregnancy. Your caregiver will be able to offer counseling for these needs as well as refer you for other special needs.  Your skin may become oily. If so, wash your face with mild soap, use non-greasy moisturizer and oil or cream based makeup. MEDICINES AND DRUG USE IN PREGNANCY  Take prenatal vitamins as directed. The vitamin should contain 1 milligram of folic acid. Keep all vitamins out of reach of children. Only a couple vitamins or tablets containing iron may be fatal to a baby or young child when ingested.  Avoid use of all medicines, including herbs, over-the-counter medicines, not prescribed or suggested by your caregiver. Only take over-the-counter or prescription medicines for pain, discomfort, or fever as directed by your caregiver. Do not use aspirin.  Let your caregiver also know about herbs you may be using.  Alcohol is related to a number of birth defects. This includes fetal alcohol syndrome. All alcohol, in any form, should be avoided completely. Smoking will cause low birth rate and premature babies.  Street or illegal drugs are very harmful to the baby. They are absolutely forbidden. A baby born to an addicted mother will be addicted at birth. The baby will go through the same withdrawal an adult does. SEEK MEDICAL CARE IF:  You have any concerns or worries during your pregnancy. It is better to call with your questions if you feel they cannot wait, rather than worry about them. SEEK IMMEDIATE  MEDICAL CARE IF:   An unexplained oral temperature above 102 F (38.9 C) develops, or as your caregiver suggests.  You have leaking of fluid from the vagina (birth canal). If leaking membranes are suspected, take your temperature and tell your caregiver of this when you call.  There is vaginal spotting, bleeding, or passing clots. Tell your caregiver of the amount and how many pads are used. Light spotting in pregnancy is common, especially following intercourse.  You develop a bad smelling vaginal discharge with a change in the color from clear to white.  You continue to feel   sick to your stomach (nauseated) and have no relief from remedies suggested. You vomit blood or coffee ground-like materials.  You lose more than 2 pounds of weight or gain more than 2 pounds of weight over 1 week, or as suggested by your caregiver.  You notice swelling of your face, hands, feet, or legs.  You get exposed to German measles and have never had them.  You are exposed to fifth disease or chickenpox.  You develop belly (abdominal) pain. Round ligament discomfort is a common non-cancerous (benign) cause of abdominal pain in pregnancy. Your caregiver still must evaluate you.  You develop a bad headache that does not go away.  You develop fever, diarrhea, pain with urination, or shortness of breath.  You develop visual problems, blurry, or double vision.  You fall or are in a car accident or any kind of trauma.  There is mental or physical violence at home. Document Released: 11/25/2001 Document Revised: 08/25/2012 Document Reviewed: 05/30/2009 [employer] Patient Information 2014 [employer].  Breastfeeding A change in hormones during your pregnancy causes growth of your breast tissue and an increase in number and size of milk ducts. The hormone prolactin allows proteins, sugars, and fats from your blood supply to make breast milk in your milk-producing glands. The hormone progesterone prevents  breast milk from being released before the birth of your baby. After the birth of your baby, your progesterone level decreases allowing breast milk to be released. Thoughts of your baby, as well as his or her sucking or crying, can stimulate the release of milk from the milk-producing glands. Deciding to breastfeed (nurse) is one of the best choices you can make for you and your baby. The information that follows gives a brief review of the benefits, as well as other important skills to know about breastfeeding. BENEFITS OF BREASTFEEDING For your baby  The first milk (colostrum) helps your baby's digestive system function better.   There are antibodies in your milk that help your baby fight off infections.   Your baby has a lower incidence of asthma, allergies, and sudden infant death syndrome (SIDS).   The nutrients in breast milk are better for your baby than infant formulas.  Breast milk improves your baby's brain development.   Your baby will have less gas, colic, and constipation.  Your baby is less likely to develop other conditions, such as childhood obesity, asthma, or diabetes mellitus. For you  Breastfeeding helps develop a very special bond between you and your baby.   Breastfeeding is convenient, always available at the correct temperature, and costs nothing.   Breastfeeding helps to burn calories and helps you lose the weight gained during pregnancy.   Breastfeeding makes your uterus contract back down to normal size faster and slows bleeding following delivery.   Breastfeeding mothers have a lower risk of developing osteoporosis or breast or ovarian cancer later in life.  BREASTFEEDING FREQUENCY  A healthy, full-term baby may breastfeed as often as every hour or space his or her feedings to every 3 hours. Breastfeeding frequency will vary from baby to baby.   Newborns should be fed no less than every 2 3 hours during the day and every 4 5 hours during the  night. You should breastfeed a minimum of 8 feedings in a 24 hour period.  Awaken your baby to breastfeed if it has been 3 4 hours since the last feeding.  Breastfeed when you feel the need to reduce the fullness of your breasts or when   your newborn shows signs of hunger. Signs that your baby may be hungry include:  Increased alertness or activity.  Stretching.  Movement of the head from side to side.  Movement of the head and opening of the mouth when the corner of the mouth or cheek is stroked (rooting).  Increased sucking sounds, smacking lips, cooing, sighing, or squeaking.  Hand-to-mouth movements.  Increased sucking of fingers or hands.  Fussing.  Intermittent crying.  Signs of extreme hunger will require calming and consoling before you try to feed your baby. Signs of extreme hunger may include:  Restlessness.  A loud, strong cry.  Screaming.  Frequent feeding will help you make more milk and will help prevent problems, such as sore nipples and engorgement of the breasts.  BREASTFEEDING   Whether lying down or sitting, be sure that the baby's abdomen is facing your abdomen.   Support your breast with 4 fingers under your breast and your thumb above your nipple. Make sure your fingers are well away from your nipple and your baby's mouth.   Stroke your baby's lips gently with your finger or nipple.   When your baby's mouth is open wide enough, place all of your nipple and as much of the colored area around your nipple (areola) as possible into your baby's mouth.  More areola should be visible above his or her upper lip than below his or her lower lip.  Your baby's tongue should be between his or her lower gum and your breast.  Ensure that your baby's mouth is correctly positioned around the nipple (latched). Your baby's lips should create a seal on your breast.  Signs that your baby has effectively latched onto your nipple include:  Tugging or sucking  without pain.  Swallowing heard between sucks.  Absent click or smacking sound.  Muscle movement above and in front of his or her ears with sucking.  Your baby must suck about 2 3 minutes in order to get your milk. Allow your baby to feed on each breast as long as he or she wants. Nurse your baby until he or she unlatches or falls asleep at the first breast, then offer the second breast.  Signs that your baby is full and satisfied include:  A gradual decrease in the number of sucks or complete cessation of sucking.  Falling asleep.  Extension or relaxation of his or her body.  Retention of a small amount of milk in his or her mouth.  Letting go of your breast by himself or herself.  Signs of effective breastfeeding in you include:  Breasts that have increased firmness, weight, and size prior to feeding.  Breasts that are softer after nursing.  Increased milk volume, as well as a change in milk consistency and color by the 5th day of breastfeeding.  Breast fullness relieved by breastfeeding.  Nipples are not sore, cracked, or bleeding.  If needed, break the suction by putting your finger into the corner of your baby's mouth and sliding your finger between his or her gums. Then, remove your breast from his or her mouth.  It is common for babies to spit up a small amount after a feeding.  Babies often swallow air during feeding. This can make babies fussy. Burping your baby between breasts can help with this.  Vitamin D supplements are recommended for babies who get only breast milk.  Avoid using a pacifier during your baby's first 4 6 weeks.  Avoid supplemental feedings of water, formula, or   juice in place of breastfeeding. Breast milk is all the food your baby needs. It is not necessary for your baby to have water or formula. Your breasts will make more milk if supplemental feedings are avoided during the early weeks. HOW TO TELL WHETHER YOUR BABY IS GETTING ENOUGH BREAST  MILK Wondering whether or not your baby is getting enough milk is a common concern among mothers. You can be assured that your baby is getting enough milk if:   Your baby is actively sucking and you hear swallowing.   Your baby seems relaxed and satisfied after a feeding.   Your baby nurses at least 8 12 times in a 24 hour time period.  During the first 3 5 days of age:  Your baby is wetting at least 3 5 diapers in a 24 hour period. The urine should be clear and pale yellow.  Your baby is having at least 3 4 stools in a 24 hour period. The stool should be soft and yellow.  At 5 7 days of age, your baby is having at least 3 6 stools in a 24 hour period. The stool should be seedy and yellow by 5 days of age.  Your baby has a weight loss less than 7 10% during the first 3 days of age.  Your baby does not lose weight after 3 7 days of age.  Your baby gains 4 7 ounces each week after he or she is 4 days of age.  Your baby gains weight by 5 days of age and is back to birth weight within 2 weeks. ENGORGEMENT In the first week after your baby is born, you may experience extremely full breasts (engorgement). When engorged, your breasts may feel heavy, warm, or tender to the touch. Engorgement peaks within 24 48 hours after delivery of your baby.  Engorgement may be reduced by:  Continuing to breastfeed.  Increasing the frequency of breastfeeding.  Taking warm showers or applying warm, moist heat to your breasts just before each feeding. This increases circulation and helps the milk flow.   Gently massaging your breast before and during the feedings. With your fingertips, massage from your chest wall towards your nipple in a circular motion.   Ensuring that your baby empties at least one breast at every feeding. It also helps to start the next feeding on the opposite breast.   Expressing breast milk by hand or by using a breast pump to empty the breasts if your baby is sleepy, or  not nursing well. You may also want to express milk if you are returning to work oryou feel you are getting engorged.  Ensuring your baby is latched on and positioned properly while breastfeeding. If you follow these suggestions, your engorgement should improve in 24 48 hours. If you are still experiencing difficulty, call your lactation consultant or caregiver.  CARING FOR YOURSELF Take care of your breasts.  Bathe or shower daily.   Avoid using soap on your nipples.   Wear a supportive bra. Avoid wearing underwire style bras.  Air dry your nipples for a 3 4minutes after each feeding.   Use only cotton bra pads to absorb breast milk leakage. Leaking of breast milk between feedings is normal.   Use only pure lanolin on your nipples after nursing. You do not need to wash it off before feeding your baby again. Another option is to express a few drops of breast milk and gently massage that milk into your nipples.  Continue   breast self-awareness checks. Take care of yourself.  Eat healthy foods. Alternate 3 meals with 3 snacks.  Avoid foods that you notice affect your baby in a bad way.  Drink milk, fruit juice, and water to satisfy your thirst (about 8 glasses a day).   Rest often, relax, and take your prenatal vitamins to prevent fatigue, stress, and anemia.  Avoid chewing and smoking tobacco.  Avoid alcohol and drug use.  Take over-the-counter and prescribed medicine only as directed by your caregiver or pharmacist. You should always check with your caregiver or pharmacist before taking any new medicine, vitamin, or herbal supplement.  Know that pregnancy is possible while breastfeeding. If desired, talk to your caregiver about family planning and safe birth control methods that may be used while breastfeeding. SEEK MEDICAL CARE IF:   You feel like you want to stop breastfeeding or have become frustrated with breastfeeding.  You have painful breasts or nipples.  Your  nipples are cracked or bleeding.  Your breasts are red, tender, or warm.  You have a swollen area on either breast.  You have a fever or chills.  You have nausea or vomiting.  You have drainage from your nipples.  Your breasts do not become full before feedings by the 5th day after delivery.  You feel sad and depressed.  Your baby is too sleepy to eat well.  Your baby is having trouble sleeping.   Your baby is wetting less than 3 diapers in a 24 hour period.  Your baby has less than 3 stools in a 24 hour period.  Your baby's skin or the white part of his or her eyes becomes more yellow.   Your baby is not gaining weight by 5 days of age. MAKE SURE YOU:   Understand these instructions.  Will watch your condition.  Will get help right away if you are not doing well or get worse. Document Released: 12/01/2005 Document Revised: 08/25/2012 Document Reviewed: 07/07/2012 [employer] Patient Information 2014 [employer].  

## 2013-05-24 NOTE — Progress Notes (Signed)
Reviewed anatomy-CPC and incomplete anatomy--will refer to MFM for [facility] and f/u anatomy

## 2013-05-24 NOTE — Progress Notes (Signed)
Just had cholesterol drawn at work= 264 total, 60 HDL, 165 LDL.

## 2013-05-30 ENCOUNTER — Encounter (HOSPITAL_COMMUNITY): Payer: Self-pay | Admitting: Family Medicine

## 2013-06-09 ENCOUNTER — Ambulatory Visit (HOSPITAL_COMMUNITY)
Admission: RE | Admit: 2013-06-09 | Discharge: 2013-06-09 | Disposition: A | Discharge status: Home / Self Care | Payer: BC Managed Care – PPO | Source: Ambulatory Visit | Attending: Family Medicine | Admitting: Family Medicine

## 2013-06-09 ENCOUNTER — Ambulatory Visit (HOSPITAL_COMMUNITY): Admission: RE | Admit: 2013-06-09 | Payer: BC Managed Care – PPO | Source: Ambulatory Visit

## 2013-06-09 ENCOUNTER — Encounter (HOSPITAL_COMMUNITY): Payer: Self-pay

## 2013-06-09 ENCOUNTER — Other Ambulatory Visit: Payer: Self-pay | Admitting: Obstetrics & Gynecology

## 2013-06-09 VITALS — BP 130/58 | HR 102 | Wt 212.0 lb

## 2013-06-09 DIAGNOSIS — O09521 Supervision of elderly multigravida, first trimester: Secondary | ICD-10-CM

## 2013-06-09 DIAGNOSIS — O350XX0 Maternal care for (suspected) central nervous system malformation in fetus, not applicable or unspecified: Secondary | ICD-10-CM | POA: Insufficient documentation

## 2013-06-09 DIAGNOSIS — O3500X0 Maternal care for (suspected) central nervous system malformation or damage in fetus, unspecified, not applicable or unspecified: Secondary | ICD-10-CM | POA: Insufficient documentation

## 2013-06-09 DIAGNOSIS — O09529 Supervision of elderly multigravida, unspecified trimester: Secondary | ICD-10-CM | POA: Insufficient documentation

## 2013-06-09 DIAGNOSIS — O26872 Cervical shortening, second trimester: Secondary | ICD-10-CM

## 2013-06-09 NOTE — Progress Notes (Signed)
Genetic Counseling  High-Risk Gestation Note  Appointment Date:  06/09/2013 Referred By: Reva Bores, MD Date of Birth:  October 15, 1976 Partner:  Nancie Neas    Pregnancy History: [id number] Estimated Date of Delivery: 09/28/13 Estimated Gestational Age: [redacted]w[redacted]d Attending: Alpha Gula, MD   Rhonda Adams and her husband, Mr. Shelsey Rieth, were seen for genetic counseling because of a maternal age of 37 y.o. and previous ultrasound finding of choroid plexus cyst.     They were counseled regarding maternal age and the association with risk for chromosome conditions due to nondisjunction with aging of the ova.   We reviewed chromosomes, nondisjunction, and the associated 1 in 111 risk for fetal aneuploidy related to a maternal age of 37 y.o. at [redacted]w[redacted]d gestation.  They were counseled that the risk for aneuploidy decreases as gestational age increases, accounting for those pregnancies which spontaneously abort.  We specifically discussed Down syndrome (trisomy 76), trisomies 54 and 46, and sex chromosome aneuploidies (47,XXX and 47,XXY) including the common features and prognoses of each.   We reviewed available screening options including noninvasive prenatal screening (NIPS)/cell free fetal DNA (cffDNA) testing and detailed ultrasound.  They were counseled that screening tests are used to modify a patient's a priori risk for aneuploidy, typically based on age. This estimate provides a pregnancy specific risk assessment. We reviewed the benefits and limitations of each option. Specifically, we discussed the conditions for which each test screens, the detection rates, and false positive rates of each. They were also counseled regarding diagnostic testing via amniocentesis. We reviewed the approximate 1 in 300-500 risk for complications for amniocentesis, including spontaneous pregnancy loss or preterm labor and delivery at this gestation.  We reviewed that on the patient's previous ultrasound,  performed on 05/05/13 at [facility], a choroid plexus cyst was visualized. A complete ultrasound was performed today. The ultrasound report will be sent under separate cover. There were no visualized fetal anomalies or markers suggestive of aneuploidy at the time of today's visit. The choroid plexus cyst appeared to have resolved. We discussed that the choroid plexus is an area in the brain where cerebral spinal fluid, the fluid that bathes the brain and spinal cord, is made.  Cysts, or fluid filled sacs, are sometimes found in the choroid plexus of babies both before and after they are born.  Approximately 1% of pregnancies evaluated by ultrasound will show these cysts.  The significance of these cysts remains unclear, although it is believed that in many cases they are a normal variation of development.  It appears that there may be a slightly increased risk of a chromosome condition associated with these cysts when they are seen with other ultrasound findings.  The presence of CPCs slightly increase the risk for Trisomy 18. Thus, the risk for trisomy 18 would be increased above the patient's age related risk to approximately 1 in 102 given the choroid plexus cyst previously visualized in the pregnancy.   After consideration of all the options, they declined NIPS and amniocentesis. The couple stated that they were comfortable with the risk assessment provided and comfortable with the follow-up ultrasound provided today. The couple also stated that following today's ultrasound they are more concerned about the discussion regarding cervical length than they are about screening or testing for fetal aneuploidy. (See separate ultrasound report for additional discussion regarding today's ultrasound findings.) They understand that ultrasound cannot diagnose or rule out all birth defects or genetic conditions.  [name] was provided  with written information regarding cystic fibrosis (CF)  including the carrier frequency and incidence in the Caucasian population, the availability of carrier testing and prenatal diagnosis if indicated.  In addition, we discussed that CF is routinely screened for as part of the [place] newborn screening panel.  She declined testing today.   Both family histories were reviewed and found to be contributory for a maternal uncle to the patient with mental retardation. She reported that his intellectual disability was attributed to oxygen deprivation at birth. He also developed schizophrenia later in life but is otherwise healthy. We discussed that there are many different causes of intellectual disabilities including environmental, multifactorial, and genetic etiologies.  We discussed that a specific diagnosis for intellectual disability can be determined in approximately 50% of these individuals.  In the remaining 50% of individuals, a diagnosis may never be determined.  Regarding genetic causes, we discussed that chromosome aberrations (aneuploidy, deletions, duplications, insertions, and translocations) are responsible for a small percentage of individuals with intellectual disability.  Many individuals with chromosome aberrations have additional differences, including congenital anomalies or minor dysmorphisms.  Likewise, single gene conditions are the underlying cause of intellectual delay in some families.  We discussed that many gene conditions have intellectual disability as a feature, but also often include other physical or medical differences.  Specifically, we reviewed fragile X syndrome and the X-linked inheritance of this condition.  We discussed the option of FMR1 (the gene that when altered causes fragile X syndrome) analysis to determine whether Fragile X syndrome is the cause of intellectual disability in this family.  Mrs. Simpson declined FMR1 analysis today.  In addition, this relative has the option of an evaluation by a medical geneticist to help determine  the cause of the familial intellectual disability. In the case of an environmental cause, such as oxygen deprivation at birth, recurrence risk would not likely be increased for relatives. We discussed that without more specific information, it is difficult to provide an accurate risk assessment.  Further genetic counseling is warranted if more information is obtained.  Additionally, the couple reported a history of four miscarriages. They reported that they previously had a work-up to assess for possible underlying causes, and a cause was not identified. Mrs. Pilkenton reported that she has an MTHFR mutation but that the work-up was otherwise normal. Certain gene changes in MTHFR have been identified that cause the gene not to work properly, causing less of the enzyme to be made. This can result in an increased amount of homocysteine in the blood and urine (hyperhomocysteinemia and homocysteinuria). The (C677T) gene change has been associated with elevated homocysteine levels which in turn, has been associated with increased risk for cardiovascular disease. The A1298C gene change does not appear to increase homocysteine levels nor the risk for cardiovascular disease. Thus, the effects of being a heterozygote for MTHFR mutation are variable.    Additionally, the patient reported that her mother died from breast cancer at age 70 years old. Mrs. Reindel reported that she has previously been tested for BRCA mutations and was negative. Though most cancers are thought to be sporadic or due to environmental factors, some families appear to have a strong predisposition to cancers.  When considering a family history of cancer, we look for common types of cancer in multiple family members occurring at younger than typical ages. Mrs. TERRIAN RIDLON has the option of meeting with a cancer genetic counselor, if not previously done, to discuss any additional possible screening or testing  options available. She reported that her  physician is aware of this history and that she is being screened and followed appropriately. Without further information regarding the provided family history, an accurate genetic risk cannot be calculated. Further genetic counseling is warranted if more information is obtained.  Mrs. Lok denied exposure to environmental toxins or chemical agents. She denied the use of tobacco or street drugs. She reported taking sips of wine occasionally, which was estimated to total less than 1 glass of wine per month.  Prenatal alcohol exposure can increase the risk for growth delays, small head size, heart defects, eye and facial differences, as well as behavior problems and learning disabilities. The risk of these to occur tends to increase with the amount of alcohol consumed. Given the reported amount of exposure, risk for associated effects are likely low in the current pregnancy. However, because there is no identified safe amount of alcohol in pregnancy, it is recommended to completely avoid alcohol in pregnancy. She denied significant viral illnesses during the course of her pregnancy. Her medical and surgical histories were noncontributory.   I counseled this couple regarding the above risks and available options.  The approximate face-to-face time with the genetic counselor was 45 minutes.  Quinn Plowman, MS,  Certified [employer] 06/09/2013

## 2013-06-09 NOTE — Progress Notes (Signed)
Rhonda Adams  was seen today for an ultrasound appointment.  See full report in [facility].  Impression: Single IUP at 24 1/7 weeks Normal interval anatomy.  The previously noted CPC appears to have resolved. Interval growth is appropriate (54th %tile) Normal amniotic fluid volume  TVUS - cervical length 1.7 cm with V-shaped funneling.  No dynamic changes noted.  The leading edge of the placenta is 1.8 cm from the internal os  See note from Runner, broadcasting/film/video.  The patient declined further work up and declined NIPS (cell free fetal DNA)  Recommendations: Will begin vaginal progesterone. Recommend follow up ultrasound in 1 week for cervical length.  If worsening cervical shortening noted, recommend course of betamethasone. Bleeding precautions / pelvic rest precautions given.  Alpha Gula, MD

## 2013-06-10 ENCOUNTER — Encounter: Payer: Self-pay | Admitting: Obstetrics & Gynecology

## 2013-06-10 DIAGNOSIS — O4400 Placenta previa specified as without hemorrhage, unspecified trimester: Secondary | ICD-10-CM | POA: Insufficient documentation

## 2013-06-10 DIAGNOSIS — O26879 Cervical shortening, unspecified trimester: Secondary | ICD-10-CM | POA: Insufficient documentation

## 2013-06-15 ENCOUNTER — Ambulatory Visit (HOSPITAL_COMMUNITY)
Admission: RE | Admit: 2013-06-15 | Discharge: 2013-06-15 | Disposition: A | Discharge status: Home / Self Care | Payer: BC Managed Care – PPO | Source: Ambulatory Visit | Attending: Family Medicine | Admitting: Family Medicine

## 2013-06-15 DIAGNOSIS — Z8751 Personal history of pre-term labor: Secondary | ICD-10-CM | POA: Insufficient documentation

## 2013-06-15 DIAGNOSIS — O26879 Cervical shortening, unspecified trimester: Secondary | ICD-10-CM | POA: Insufficient documentation

## 2013-06-15 DIAGNOSIS — O09529 Supervision of elderly multigravida, unspecified trimester: Secondary | ICD-10-CM | POA: Insufficient documentation

## 2013-06-15 DIAGNOSIS — O3500X0 Maternal care for (suspected) central nervous system malformation or damage in fetus, unspecified, not applicable or unspecified: Secondary | ICD-10-CM | POA: Insufficient documentation

## 2013-06-15 DIAGNOSIS — O2620 Pregnancy care for patient with recurrent pregnancy loss, unspecified trimester: Secondary | ICD-10-CM | POA: Insufficient documentation

## 2013-06-15 DIAGNOSIS — O26872 Cervical shortening, second trimester: Secondary | ICD-10-CM

## 2013-06-15 DIAGNOSIS — O09521 Supervision of elderly multigravida, first trimester: Secondary | ICD-10-CM

## 2013-06-15 DIAGNOSIS — O350XX0 Maternal care for (suspected) central nervous system malformation in fetus, not applicable or unspecified: Secondary | ICD-10-CM | POA: Insufficient documentation

## 2013-06-15 DIAGNOSIS — O4400 Placenta previa specified as without hemorrhage, unspecified trimester: Secondary | ICD-10-CM | POA: Insufficient documentation

## 2013-06-15 MED ORDER — BETAMETHASONE SOD PHOS & ACET 6 (3-3) MG/ML IJ SUSP
12.0000 mg | Freq: Once | INTRAMUSCULAR | Status: AC
  Administered 2013-06-15: 12 mg via INTRAMUSCULAR
  Filled 2013-06-15: qty 2

## 2013-06-15 NOTE — Progress Notes (Signed)
Rhonda Adams was seen for ultrasound appointment today.  Please see [facility] report for details.

## 2013-06-16 ENCOUNTER — Ambulatory Visit (HOSPITAL_COMMUNITY)
Admission: RE | Admit: 2013-06-16 | Discharge: 2013-06-16 | Disposition: A | Discharge status: Home / Self Care | Payer: BC Managed Care – PPO | Source: Ambulatory Visit | Attending: Family Medicine | Admitting: Family Medicine

## 2013-06-16 DIAGNOSIS — O26879 Cervical shortening, unspecified trimester: Secondary | ICD-10-CM | POA: Insufficient documentation

## 2013-06-16 DIAGNOSIS — O4700 False labor before 37 completed weeks of gestation, unspecified trimester: Secondary | ICD-10-CM | POA: Insufficient documentation

## 2013-06-16 DIAGNOSIS — O09529 Supervision of elderly multigravida, unspecified trimester: Secondary | ICD-10-CM | POA: Insufficient documentation

## 2013-06-16 MED ORDER — BETAMETHASONE SOD PHOS & ACET 6 (3-3) MG/ML IJ SUSP
12.0000 mg | Freq: Once | INTRAMUSCULAR | Status: AC
  Administered 2013-06-16: 12 mg via INTRAMUSCULAR
  Filled 2013-06-16: qty 2

## 2013-06-20 ENCOUNTER — Other Ambulatory Visit: Payer: Self-pay | Admitting: Family Medicine

## 2013-06-20 ENCOUNTER — Ambulatory Visit (INDEPENDENT_AMBULATORY_CARE_PROVIDER_SITE_OTHER): Payer: BC Managed Care – PPO | Admitting: Family Medicine

## 2013-06-20 ENCOUNTER — Encounter: Payer: Self-pay | Admitting: Family Medicine

## 2013-06-20 VITALS — BP 81/57 | Wt 212.0 lb

## 2013-06-20 DIAGNOSIS — O26879 Cervical shortening, unspecified trimester: Secondary | ICD-10-CM

## 2013-06-20 DIAGNOSIS — O26872 Cervical shortening, second trimester: Secondary | ICD-10-CM

## 2013-06-20 DIAGNOSIS — O09529 Supervision of elderly multigravida, unspecified trimester: Secondary | ICD-10-CM

## 2013-06-20 DIAGNOSIS — Z3480 Encounter for supervision of other normal pregnancy, unspecified trimester: Secondary | ICD-10-CM

## 2013-06-20 NOTE — Progress Notes (Signed)
Planning to leave to the [facility] and for further cervical length.  Noted to be short on f/u anatomy.  On prometrium and s/p BMZ. No s/sx's labor. Cervix is soft and anterior.  Does not permit one finger.

## 2013-06-20 NOTE — Patient Instructions (Signed)
Pregnancy - Second Trimester The second trimester of pregnancy (3 to 6 months) is a period of rapid growth for you and your baby. At the end of the sixth month, your baby is about 9 inches long and weighs 1 1/2 pounds. You will begin to feel the baby move between 18 and 20 weeks of the pregnancy. This is called quickening. Weight gain is faster. A clear fluid (colostrum) may leak out of your breasts. You may feel small contractions of the womb (uterus). This is known as false labor or Braxton-Hicks contractions. This is like a practice for labor when the baby is ready to be born. Usually, the problems with morning sickness have usually passed by the end of your first trimester. Some women develop small dark blotches (called cholasma, mask of pregnancy) on their face that usually goes away after the baby is born. Exposure to the sun makes the blotches worse. Acne may also develop in some pregnant women and pregnant women who have acne, may find that it goes away. PRENATAL EXAMS  Blood work may continue to be done during prenatal exams. These tests are done to check on your health and the probable health of your baby. Blood work is used to follow your blood levels (hemoglobin). Anemia (low hemoglobin) is common during pregnancy. Iron and vitamins are given to help prevent this. You will also be checked for diabetes between 24 and 28 weeks of the pregnancy. Some of the previous blood tests may be repeated.  The size of the uterus is measured during each visit. This is to make sure that the baby is continuing to grow properly according to the dates of the pregnancy.  Your blood pressure is checked every prenatal visit. This is to make sure you are not getting toxemia.  Your urine is checked to make sure you do not have an infection, diabetes or protein in the urine.  Your weight is checked often to make sure gains are happening at the suggested rate. This is to ensure that both you and your baby are  growing normally.  Sometimes, an ultrasound is performed to confirm the proper growth and development of the baby. This is a test which bounces harmless sound waves off the baby so your caregiver can more accurately determine due dates. Sometimes, a test is done on the amniotic fluid surrounding the baby. This test is called an amniocentesis. The amniotic fluid is obtained by sticking a needle into the belly (abdomen). This is done to check the chromosomes in instances where there is a concern about possible genetic problems with the baby. It is also sometimes done near the end of pregnancy if an early delivery is required. In this case, it is done to help make sure the baby's lungs are mature enough for the baby to live outside of the womb. CHANGES OCCURING IN THE SECOND TRIMESTER OF PREGNANCY Your body goes through many changes during pregnancy. They vary from person to person. Talk to your caregiver about changes you notice that you are concerned about.  During the second trimester, you will likely have an increase in your appetite. It is normal to have cravings for certain foods. This varies from person to person and pregnancy to pregnancy.  Your lower abdomen will begin to bulge.  You may have to urinate more often because the uterus and baby are pressing on your bladder. It is also common to get more bladder infections during pregnancy. You can help this by drinking lots of fluids   and emptying your bladder before and after intercourse.  You may begin to get stretch marks on your hips, abdomen, and breasts. These are normal changes in the body during pregnancy. There are no exercises or medicines to take that prevent this change.  You may begin to develop swollen and bulging veins (varicose veins) in your legs. Wearing support hose, elevating your feet for 15 minutes, 3 to 4 times a day and limiting salt in your diet helps lessen the problem.  Heartburn may develop as the uterus grows and  pushes up against the stomach. Antacids recommended by your caregiver helps with this problem. Also, eating smaller meals 4 to 5 times a day helps.  Constipation can be treated with a stool softener or adding bulk to your diet. Drinking lots of fluids, and eating vegetables, fruits, and whole grains are helpful.  Exercising is also helpful. If you have been very active up until your pregnancy, most of these activities can be continued during your pregnancy. If you have been less active, it is helpful to start an exercise program such as walking.  Hemorrhoids may develop at the end of the second trimester. Warm sitz baths and hemorrhoid cream recommended by your caregiver helps hemorrhoid problems.  Backaches may develop during this time of your pregnancy. Avoid heavy lifting, wear low heal shoes, and practice good posture to help with backache problems.  Some pregnant women develop tingling and numbness of their hand and fingers because of swelling and tightening of ligaments in the wrist (carpel tunnel syndrome). This goes away after the baby is born.  As your breasts enlarge, you may have to get a bigger bra. Get a comfortable, cotton, support bra. Do not get a nursing bra until the last month of the pregnancy if you will be nursing the baby.  You may get a dark line from your belly button to the pubic area called the linea nigra.  You may develop rosy cheeks because of increase blood flow to the face.  You may develop spider looking lines of the face, neck, arms, and chest. These go away after the baby is born. HOME CARE INSTRUCTIONS   It is extremely important to avoid all smoking, herbs, alcohol, and unprescribed drugs during your pregnancy. These chemicals affect the formation and growth of the baby. Avoid these chemicals throughout the pregnancy to ensure the delivery of a healthy infant.  Most of your home care instructions are the same as suggested for the first trimester of your  pregnancy. Keep your caregiver's appointments. Follow your caregiver's instructions regarding medicine use, exercise, and diet.  During pregnancy, you are providing food for you and your baby. Continue to eat regular, well-balanced meals. Choose foods such as meat, fish, milk and other low fat dairy products, vegetables, fruits, and whole-grain breads and cereals. Your caregiver will tell you of the ideal weight gain.  A physical sexual relationship may be continued up until near the end of pregnancy if there are no other problems. Problems could include early (premature) leaking of amniotic fluid from the membranes, vaginal bleeding, abdominal pain, or other medical or pregnancy problems.  Exercise regularly if there are no restrictions. Check with your caregiver if you are unsure of the safety of some of your exercises. The greatest weight gain will occur in the last 2 trimesters of pregnancy. Exercise will help you:  Control your weight.  Get you in shape for labor and delivery.  Lose weight after you have the baby.  Wear   a good support or jogging bra for breast tenderness during pregnancy. This may help if worn during sleep. Pads or tissues may be used in the bra if you are leaking colostrum.  Do not use hot tubs, steam rooms or saunas throughout the pregnancy.  Wear your seat belt at all times when driving. This protects you and your baby if you are in an accident.  Avoid raw meat, uncooked cheese, cat litter boxes, and soil used by cats. These carry germs that can cause birth defects in the baby.  The second trimester is also a good time to visit your dentist for your dental health if this has not been done yet. Getting your teeth cleaned is okay. Use a soft toothbrush. Brush gently during pregnancy.  It is easier to leak urine during pregnancy. Tightening up and strengthening the pelvic muscles will help with this problem. Practice stopping your urination while you are going to the  bathroom. These are the same muscles you need to strengthen. It is also the muscles you would use as if you were trying to stop from passing gas. You can practice tightening these muscles up 10 times a set and repeating this about 3 times per day. Once you know what muscles to tighten up, do not perform these exercises during urination. It is more likely to contribute to an infection by backing up the urine.  Ask for help if you have financial, counseling, or nutritional needs during pregnancy. Your caregiver will be able to offer counseling for these needs as well as refer you for other special needs.  Your skin may become oily. If so, wash your face with mild soap, use non-greasy moisturizer and oil or cream based makeup. MEDICINES AND DRUG USE IN PREGNANCY  Take prenatal vitamins as directed. The vitamin should contain 1 milligram of folic acid. Keep all vitamins out of reach of children. Only a couple vitamins or tablets containing iron may be fatal to a baby or young child when ingested.  Avoid use of all medicines, including herbs, over-the-counter medicines, not prescribed or suggested by your caregiver. Only take over-the-counter or prescription medicines for pain, discomfort, or fever as directed by your caregiver. Do not use aspirin.  Let your caregiver also know about herbs you may be using.  Alcohol is related to a number of birth defects. This includes fetal alcohol syndrome. All alcohol, in any form, should be avoided completely. Smoking will cause low birth rate and premature babies.  Street or illegal drugs are very harmful to the baby. They are absolutely forbidden. A baby born to an addicted mother will be addicted at birth. The baby will go through the same withdrawal an adult does. SEEK MEDICAL CARE IF:  You have any concerns or worries during your pregnancy. It is better to call with your questions if you feel they cannot wait, rather than worry about them. SEEK IMMEDIATE  MEDICAL CARE IF:   An unexplained oral temperature above 102 F (38.9 C) develops, or as your caregiver suggests.  You have leaking of fluid from the vagina (birth canal). If leaking membranes are suspected, take your temperature and tell your caregiver of this when you call.  There is vaginal spotting, bleeding, or passing clots. Tell your caregiver of the amount and how many pads are used. Light spotting in pregnancy is common, especially following intercourse.  You develop a bad smelling vaginal discharge with a change in the color from clear to white.  You continue to feel   sick to your stomach (nauseated) and have no relief from remedies suggested. You vomit blood or coffee ground-like materials.  You lose more than 2 pounds of weight or gain more than 2 pounds of weight over 1 week, or as suggested by your caregiver.  You notice swelling of your face, hands, feet, or legs.  You get exposed to German measles and have never had them.  You are exposed to fifth disease or chickenpox.  You develop belly (abdominal) pain. Round ligament discomfort is a common non-cancerous (benign) cause of abdominal pain in pregnancy. Your caregiver still must evaluate you.  You develop a bad headache that does not go away.  You develop fever, diarrhea, pain with urination, or shortness of breath.  You develop visual problems, blurry, or double vision.  You fall or are in a car accident or any kind of trauma.  There is mental or physical violence at home. Document Released: 11/25/2001 Document Revised: 08/25/2012 Document Reviewed: 05/30/2009 [employer] Patient Information 2014 [employer].  Breastfeeding A change in hormones during your pregnancy causes growth of your breast tissue and an increase in number and size of milk ducts. The hormone prolactin allows proteins, sugars, and fats from your blood supply to make breast milk in your milk-producing glands. The hormone progesterone prevents  breast milk from being released before the birth of your baby. After the birth of your baby, your progesterone level decreases allowing breast milk to be released. Thoughts of your baby, as well as his or her sucking or crying, can stimulate the release of milk from the milk-producing glands. Deciding to breastfeed (nurse) is one of the best choices you can make for you and your baby. The information that follows gives a brief review of the benefits, as well as other important skills to know about breastfeeding. BENEFITS OF BREASTFEEDING For your baby  The first milk (colostrum) helps your baby's digestive system function better.   There are antibodies in your milk that help your baby fight off infections.   Your baby has a lower incidence of asthma, allergies, and sudden infant death syndrome (SIDS).   The nutrients in breast milk are better for your baby than infant formulas.  Breast milk improves your baby's brain development.   Your baby will have less gas, colic, and constipation.  Your baby is less likely to develop other conditions, such as childhood obesity, asthma, or diabetes mellitus. For you  Breastfeeding helps develop a very special bond between you and your baby.   Breastfeeding is convenient, always available at the correct temperature, and costs nothing.   Breastfeeding helps to burn calories and helps you lose the weight gained during pregnancy.   Breastfeeding makes your uterus contract back down to normal size faster and slows bleeding following delivery.   Breastfeeding mothers have a lower risk of developing osteoporosis or breast or ovarian cancer later in life.  BREASTFEEDING FREQUENCY  A healthy, full-term baby may breastfeed as often as every hour or space his or her feedings to every 3 hours. Breastfeeding frequency will vary from baby to baby.   Newborns should be fed no less than every 2 3 hours during the day and every 4 5 hours during the  night. You should breastfeed a minimum of 8 feedings in a 24 hour period.  Awaken your baby to breastfeed if it has been 3 4 hours since the last feeding.  Breastfeed when you feel the need to reduce the fullness of your breasts or when   your newborn shows signs of hunger. Signs that your baby may be hungry include:  Increased alertness or activity.  Stretching.  Movement of the head from side to side.  Movement of the head and opening of the mouth when the corner of the mouth or cheek is stroked (rooting).  Increased sucking sounds, smacking lips, cooing, sighing, or squeaking.  Hand-to-mouth movements.  Increased sucking of fingers or hands.  Fussing.  Intermittent crying.  Signs of extreme hunger will require calming and consoling before you try to feed your baby. Signs of extreme hunger may include:  Restlessness.  A loud, strong cry.  Screaming.  Frequent feeding will help you make more milk and will help prevent problems, such as sore nipples and engorgement of the breasts.  BREASTFEEDING   Whether lying down or sitting, be sure that the baby's abdomen is facing your abdomen.   Support your breast with 4 fingers under your breast and your thumb above your nipple. Make sure your fingers are well away from your nipple and your baby's mouth.   Stroke your baby's lips gently with your finger or nipple.   When your baby's mouth is open wide enough, place all of your nipple and as much of the colored area around your nipple (areola) as possible into your baby's mouth.  More areola should be visible above his or her upper lip than below his or her lower lip.  Your baby's tongue should be between his or her lower gum and your breast.  Ensure that your baby's mouth is correctly positioned around the nipple (latched). Your baby's lips should create a seal on your breast.  Signs that your baby has effectively latched onto your nipple include:  Tugging or sucking  without pain.  Swallowing heard between sucks.  Absent click or smacking sound.  Muscle movement above and in front of his or her ears with sucking.  Your baby must suck about 2 3 minutes in order to get your milk. Allow your baby to feed on each breast as long as he or she wants. Nurse your baby until he or she unlatches or falls asleep at the first breast, then offer the second breast.  Signs that your baby is full and satisfied include:  A gradual decrease in the number of sucks or complete cessation of sucking.  Falling asleep.  Extension or relaxation of his or her body.  Retention of a small amount of milk in his or her mouth.  Letting go of your breast by himself or herself.  Signs of effective breastfeeding in you include:  Breasts that have increased firmness, weight, and size prior to feeding.  Breasts that are softer after nursing.  Increased milk volume, as well as a change in milk consistency and color by the 5th day of breastfeeding.  Breast fullness relieved by breastfeeding.  Nipples are not sore, cracked, or bleeding.  If needed, break the suction by putting your finger into the corner of your baby's mouth and sliding your finger between his or her gums. Then, remove your breast from his or her mouth.  It is common for babies to spit up a small amount after a feeding.  Babies often swallow air during feeding. This can make babies fussy. Burping your baby between breasts can help with this.  Vitamin D supplements are recommended for babies who get only breast milk.  Avoid using a pacifier during your baby's first 4 6 weeks.  Avoid supplemental feedings of water, formula, or   juice in place of breastfeeding. Breast milk is all the food your baby needs. It is not necessary for your baby to have water or formula. Your breasts will make more milk if supplemental feedings are avoided during the early weeks. HOW TO TELL WHETHER YOUR BABY IS GETTING ENOUGH BREAST  MILK Wondering whether or not your baby is getting enough milk is a common concern among mothers. You can be assured that your baby is getting enough milk if:   Your baby is actively sucking and you hear swallowing.   Your baby seems relaxed and satisfied after a feeding.   Your baby nurses at least 8 12 times in a 24 hour time period.  During the first 3 5 days of age:  Your baby is wetting at least 3 5 diapers in a 24 hour period. The urine should be clear and pale yellow.  Your baby is having at least 3 4 stools in a 24 hour period. The stool should be soft and yellow.  At 5 7 days of age, your baby is having at least 3 6 stools in a 24 hour period. The stool should be seedy and yellow by 5 days of age.  Your baby has a weight loss less than 7 10% during the first 3 days of age.  Your baby does not lose weight after 3 7 days of age.  Your baby gains 4 7 ounces each week after he or she is 4 days of age.  Your baby gains weight by 5 days of age and is back to birth weight within 2 weeks. ENGORGEMENT In the first week after your baby is born, you may experience extremely full breasts (engorgement). When engorged, your breasts may feel heavy, warm, or tender to the touch. Engorgement peaks within 24 48 hours after delivery of your baby.  Engorgement may be reduced by:  Continuing to breastfeed.  Increasing the frequency of breastfeeding.  Taking warm showers or applying warm, moist heat to your breasts just before each feeding. This increases circulation and helps the milk flow.   Gently massaging your breast before and during the feedings. With your fingertips, massage from your chest wall towards your nipple in a circular motion.   Ensuring that your baby empties at least one breast at every feeding. It also helps to start the next feeding on the opposite breast.   Expressing breast milk by hand or by using a breast pump to empty the breasts if your baby is sleepy, or  not nursing well. You may also want to express milk if you are returning to work oryou feel you are getting engorged.  Ensuring your baby is latched on and positioned properly while breastfeeding. If you follow these suggestions, your engorgement should improve in 24 48 hours. If you are still experiencing difficulty, call your lactation consultant or caregiver.  CARING FOR YOURSELF Take care of your breasts.  Bathe or shower daily.   Avoid using soap on your nipples.   Wear a supportive bra. Avoid wearing underwire style bras.  Air dry your nipples for a 3 4minutes after each feeding.   Use only cotton bra pads to absorb breast milk leakage. Leaking of breast milk between feedings is normal.   Use only pure lanolin on your nipples after nursing. You do not need to wash it off before feeding your baby again. Another option is to express a few drops of breast milk and gently massage that milk into your nipples.  Continue   breast self-awareness checks. Take care of yourself.  Eat healthy foods. Alternate 3 meals with 3 snacks.  Avoid foods that you notice affect your baby in a bad way.  Drink milk, fruit juice, and water to satisfy your thirst (about 8 glasses a day).   Rest often, relax, and take your prenatal vitamins to prevent fatigue, stress, and anemia.  Avoid chewing and smoking tobacco.  Avoid alcohol and drug use.  Take over-the-counter and prescribed medicine only as directed by your caregiver or pharmacist. You should always check with your caregiver or pharmacist before taking any new medicine, vitamin, or herbal supplement.  Know that pregnancy is possible while breastfeeding. If desired, talk to your caregiver about family planning and safe birth control methods that may be used while breastfeeding. SEEK MEDICAL CARE IF:   You feel like you want to stop breastfeeding or have become frustrated with breastfeeding.  You have painful breasts or nipples.  Your  nipples are cracked or bleeding.  Your breasts are red, tender, or warm.  You have a swollen area on either breast.  You have a fever or chills.  You have nausea or vomiting.  You have drainage from your nipples.  Your breasts do not become full before feedings by the 5th day after delivery.  You feel sad and depressed.  Your baby is too sleepy to eat well.  Your baby is having trouble sleeping.   Your baby is wetting less than 3 diapers in a 24 hour period.  Your baby has less than 3 stools in a 24 hour period.  Your baby's skin or the white part of his or her eyes becomes more yellow.   Your baby is not gaining weight by 5 days of age. MAKE SURE YOU:   Understand these instructions.  Will watch your condition.  Will get help right away if you are not doing well or get worse. Document Released: 12/01/2005 Document Revised: 08/25/2012 Document Reviewed: 07/07/2012 [employer] Patient Information 2014 [employer].  

## 2013-06-21 ENCOUNTER — Ambulatory Visit (HOSPITAL_COMMUNITY)
Admission: RE | Admit: 2013-06-21 | Discharge: 2013-06-21 | Disposition: A | Discharge status: Home / Self Care | Payer: BC Managed Care – PPO | Source: Ambulatory Visit | Attending: Family Medicine | Admitting: Family Medicine

## 2013-06-21 ENCOUNTER — Encounter (HOSPITAL_COMMUNITY): Payer: Self-pay

## 2013-06-21 VITALS — BP 119/73 | HR 125 | Wt 213.0 lb

## 2013-06-21 DIAGNOSIS — Z8751 Personal history of pre-term labor: Secondary | ICD-10-CM | POA: Insufficient documentation

## 2013-06-21 DIAGNOSIS — O3500X0 Maternal care for (suspected) central nervous system malformation or damage in fetus, unspecified, not applicable or unspecified: Secondary | ICD-10-CM | POA: Insufficient documentation

## 2013-06-21 DIAGNOSIS — O09529 Supervision of elderly multigravida, unspecified trimester: Secondary | ICD-10-CM | POA: Insufficient documentation

## 2013-06-21 DIAGNOSIS — O26872 Cervical shortening, second trimester: Secondary | ICD-10-CM

## 2013-06-21 DIAGNOSIS — O2620 Pregnancy care for patient with recurrent pregnancy loss, unspecified trimester: Secondary | ICD-10-CM | POA: Insufficient documentation

## 2013-06-21 DIAGNOSIS — O4400 Placenta previa specified as without hemorrhage, unspecified trimester: Secondary | ICD-10-CM | POA: Insufficient documentation

## 2013-06-21 DIAGNOSIS — O26879 Cervical shortening, unspecified trimester: Secondary | ICD-10-CM | POA: Insufficient documentation

## 2013-06-21 DIAGNOSIS — O350XX0 Maternal care for (suspected) central nervous system malformation in fetus, not applicable or unspecified: Secondary | ICD-10-CM | POA: Insufficient documentation

## 2013-06-22 ENCOUNTER — Encounter: Payer: Self-pay | Admitting: Family Medicine

## 2013-06-28 ENCOUNTER — Ambulatory Visit (HOSPITAL_COMMUNITY)
Admission: RE | Admit: 2013-06-28 | Discharge: 2013-06-28 | Disposition: A | Discharge status: Home / Self Care | Payer: BC Managed Care – PPO | Source: Ambulatory Visit | Attending: Obstetrics & Gynecology | Admitting: Obstetrics & Gynecology

## 2013-06-28 DIAGNOSIS — O2620 Pregnancy care for patient with recurrent pregnancy loss, unspecified trimester: Secondary | ICD-10-CM | POA: Insufficient documentation

## 2013-06-28 DIAGNOSIS — O26879 Cervical shortening, unspecified trimester: Secondary | ICD-10-CM | POA: Insufficient documentation

## 2013-06-28 DIAGNOSIS — O09529 Supervision of elderly multigravida, unspecified trimester: Secondary | ICD-10-CM | POA: Insufficient documentation

## 2013-06-28 DIAGNOSIS — Z8751 Personal history of pre-term labor: Secondary | ICD-10-CM | POA: Insufficient documentation

## 2013-06-28 DIAGNOSIS — O350XX0 Maternal care for (suspected) central nervous system malformation in fetus, not applicable or unspecified: Secondary | ICD-10-CM | POA: Insufficient documentation

## 2013-06-28 DIAGNOSIS — O3500X0 Maternal care for (suspected) central nervous system malformation or damage in fetus, unspecified, not applicable or unspecified: Secondary | ICD-10-CM | POA: Insufficient documentation

## 2013-06-28 DIAGNOSIS — O26872 Cervical shortening, second trimester: Secondary | ICD-10-CM

## 2013-07-05 ENCOUNTER — Ambulatory Visit (INDEPENDENT_AMBULATORY_CARE_PROVIDER_SITE_OTHER): Payer: BC Managed Care – PPO | Admitting: Obstetrics & Gynecology

## 2013-07-05 ENCOUNTER — Encounter: Payer: Self-pay | Admitting: Obstetrics & Gynecology

## 2013-07-05 VITALS — BP 102/66 | Wt 214.0 lb

## 2013-07-05 DIAGNOSIS — O163 Unspecified maternal hypertension, third trimester: Secondary | ICD-10-CM

## 2013-07-05 DIAGNOSIS — O99119 Other diseases of the blood and blood-forming organs and certain disorders involving the immune mechanism complicating pregnancy, unspecified trimester: Secondary | ICD-10-CM

## 2013-07-05 DIAGNOSIS — O09219 Supervision of pregnancy with history of pre-term labor, unspecified trimester: Secondary | ICD-10-CM

## 2013-07-05 DIAGNOSIS — O09523 Supervision of elderly multigravida, third trimester: Secondary | ICD-10-CM

## 2013-07-05 DIAGNOSIS — O4400 Placenta previa specified as without hemorrhage, unspecified trimester: Secondary | ICD-10-CM

## 2013-07-05 DIAGNOSIS — O09213 Supervision of pregnancy with history of pre-term labor, third trimester: Secondary | ICD-10-CM

## 2013-07-05 DIAGNOSIS — O26873 Cervical shortening, third trimester: Secondary | ICD-10-CM

## 2013-07-05 DIAGNOSIS — Z23 Encounter for immunization: Secondary | ICD-10-CM

## 2013-07-05 DIAGNOSIS — E7212 Methylenetetrahydrofolate reductase deficiency: Secondary | ICD-10-CM

## 2013-07-05 DIAGNOSIS — O26879 Cervical shortening, unspecified trimester: Secondary | ICD-10-CM

## 2013-07-05 DIAGNOSIS — O139 Gestational [pregnancy-induced] hypertension without significant proteinuria, unspecified trimester: Secondary | ICD-10-CM

## 2013-07-05 DIAGNOSIS — O4403 Placenta previa specified as without hemorrhage, third trimester: Secondary | ICD-10-CM

## 2013-07-05 DIAGNOSIS — O09529 Supervision of elderly multigravida, unspecified trimester: Secondary | ICD-10-CM

## 2013-07-05 LAB — CBC
HCT: 36.5 % (ref 36.0–46.0)
Hemoglobin: 11.9 g/dL — ABNORMAL LOW (ref 12.0–15.0)
RBC: 4.1 MIL/uL (ref 3.87–5.11)

## 2013-07-05 MED ORDER — TETANUS-DIPHTH-ACELL PERTUSSIS 5-2.5-18.5 LF-MCG/0.5 IM SUSP: 0.5000 mL | Freq: Once | INTRAMUSCULAR | Status: DC

## 2013-07-05 NOTE — Progress Notes (Signed)
Ultrasound scheduled on 07/12/13.  1 hr GTT, third trimester labs today.  Counseled about Tdap, will receive today.  No other complaints or concerns.  Fetal movement and labor precautions reviewed.

## 2013-07-05 NOTE — Patient Instructions (Signed)
Return to clinic for any obstetric concerns or go to [facility] for evaluation  

## 2013-07-06 LAB — RPR

## 2013-07-06 LAB — GLUCOSE TOLERANCE, 1 HOUR (50G) W/O FASTING: Glucose, 1 Hour GTT: 155 mg/dL — ABNORMAL HIGH (ref 70–140)

## 2013-07-06 LAB — HIV ANTIBODY (ROUTINE TESTING W REFLEX): HIV: NONREACTIVE

## 2013-07-07 ENCOUNTER — Encounter: Payer: Self-pay | Admitting: Obstetrics & Gynecology

## 2013-07-08 ENCOUNTER — Other Ambulatory Visit: Payer: Self-pay | Admitting: Obstetrics & Gynecology

## 2013-07-08 ENCOUNTER — Telehealth: Payer: Self-pay | Admitting: *Deleted

## 2013-07-08 DIAGNOSIS — O2620 Pregnancy care for patient with recurrent pregnancy loss, unspecified trimester: Secondary | ICD-10-CM

## 2013-07-08 DIAGNOSIS — O26879 Cervical shortening, unspecified trimester: Secondary | ICD-10-CM

## 2013-07-08 DIAGNOSIS — O09529 Supervision of elderly multigravida, unspecified trimester: Secondary | ICD-10-CM

## 2013-07-08 NOTE — Telephone Encounter (Signed)
Message copied by Grayland Ormond on Fri Jul 08, 2013 10:07 AM ------      Message from: Jaynie Collins A      Created: Thu Jul 07, 2013  4:12 PM       1 hr GTT 155.  Please schedule for 3 hr GTT. ------

## 2013-07-08 NOTE — Telephone Encounter (Signed)
Pt aware.  3 hr GTT 7/28.

## 2013-07-11 ENCOUNTER — Other Ambulatory Visit (INDEPENDENT_AMBULATORY_CARE_PROVIDER_SITE_OTHER): Payer: BC Managed Care – PPO | Admitting: *Deleted

## 2013-07-11 DIAGNOSIS — R7309 Other abnormal glucose: Secondary | ICD-10-CM

## 2013-07-12 ENCOUNTER — Ambulatory Visit (HOSPITAL_COMMUNITY)
Admission: RE | Admit: 2013-07-12 | Discharge: 2013-07-12 | Disposition: A | Discharge status: Home / Self Care | Payer: BC Managed Care – PPO | Source: Ambulatory Visit | Attending: Obstetrics and Gynecology | Admitting: Obstetrics and Gynecology

## 2013-07-12 DIAGNOSIS — O3500X0 Maternal care for (suspected) central nervous system malformation or damage in fetus, unspecified, not applicable or unspecified: Secondary | ICD-10-CM | POA: Insufficient documentation

## 2013-07-12 DIAGNOSIS — Z8751 Personal history of pre-term labor: Secondary | ICD-10-CM | POA: Insufficient documentation

## 2013-07-12 DIAGNOSIS — O2620 Pregnancy care for patient with recurrent pregnancy loss, unspecified trimester: Secondary | ICD-10-CM | POA: Insufficient documentation

## 2013-07-12 DIAGNOSIS — O350XX0 Maternal care for (suspected) central nervous system malformation in fetus, not applicable or unspecified: Secondary | ICD-10-CM | POA: Insufficient documentation

## 2013-07-12 DIAGNOSIS — O26879 Cervical shortening, unspecified trimester: Secondary | ICD-10-CM | POA: Insufficient documentation

## 2013-07-12 DIAGNOSIS — O09529 Supervision of elderly multigravida, unspecified trimester: Secondary | ICD-10-CM | POA: Insufficient documentation

## 2013-07-12 LAB — GLUCOSE TOLERANCE, 3 HOURS
Glucose Tolerance, 1 hour: 169 mg/dL (ref 70–189)
Glucose Tolerance, 2 hour: 131 mg/dL (ref 70–164)
Glucose Tolerance, Fasting: 89 mg/dL (ref 70–104)

## 2013-07-19 IMAGING — US US OB COMP +14 WK
2 series · 12 of 28 positions shown · non-contrast
Comparison: none

[Series 1: us ob comp +14 wk · 3 of 21 slices shown (1 of 2)]
[im 4/21]
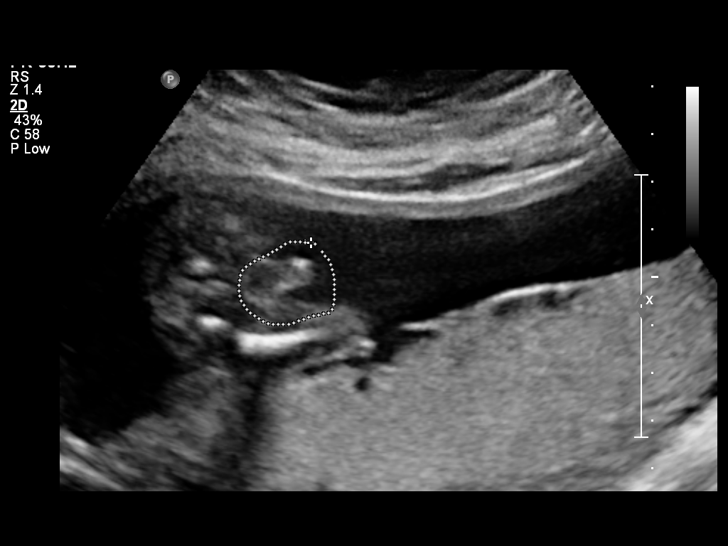
[im 11/21]
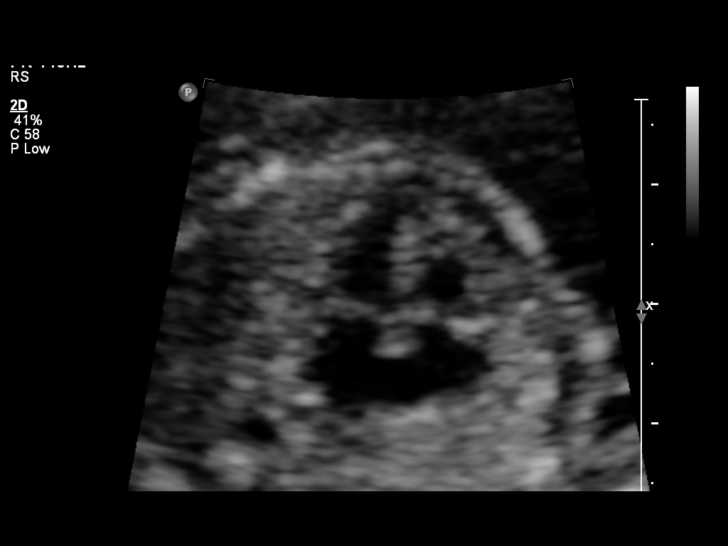
[im 17/21]
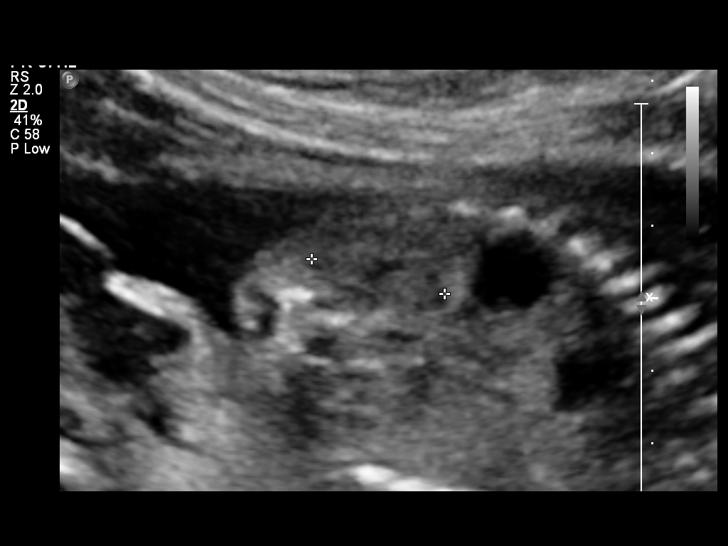

[Series 1: us ob comp +14 wk · 9 of 61 slices shown (2 of 2)]
[im 4/61]
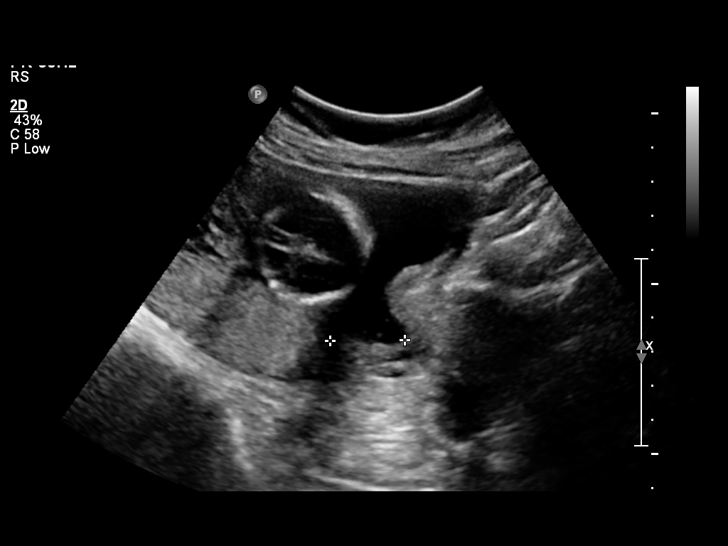
[im 10/61]
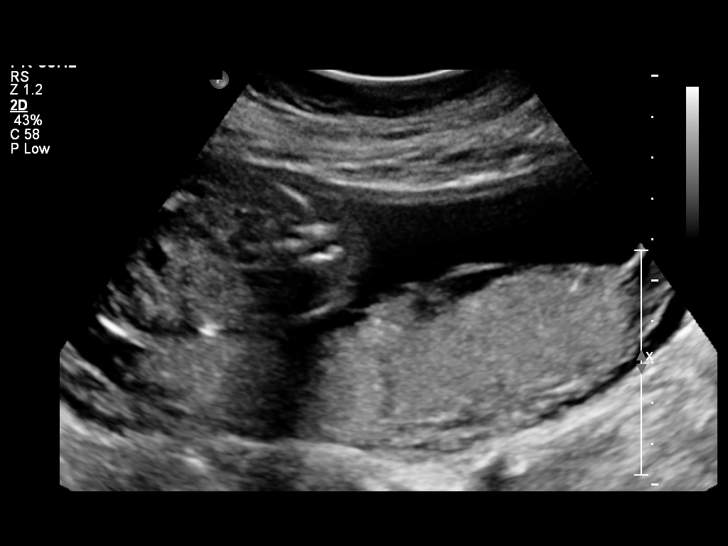
[im 16/61]
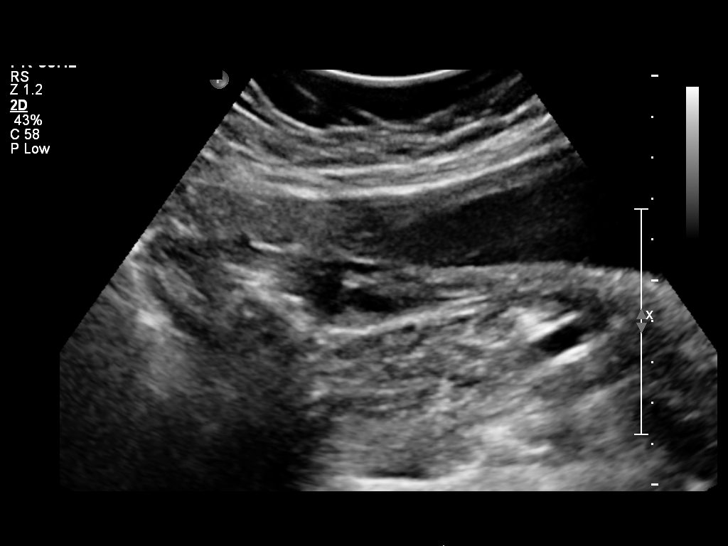
[im 25/61]
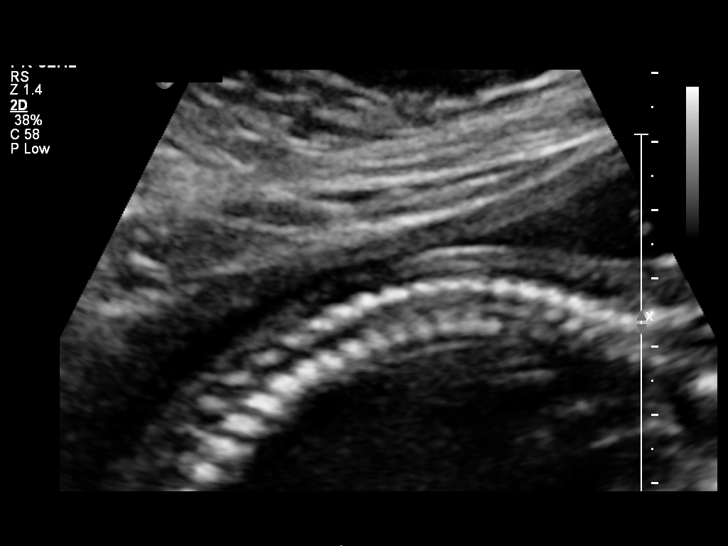
[im 31/61]
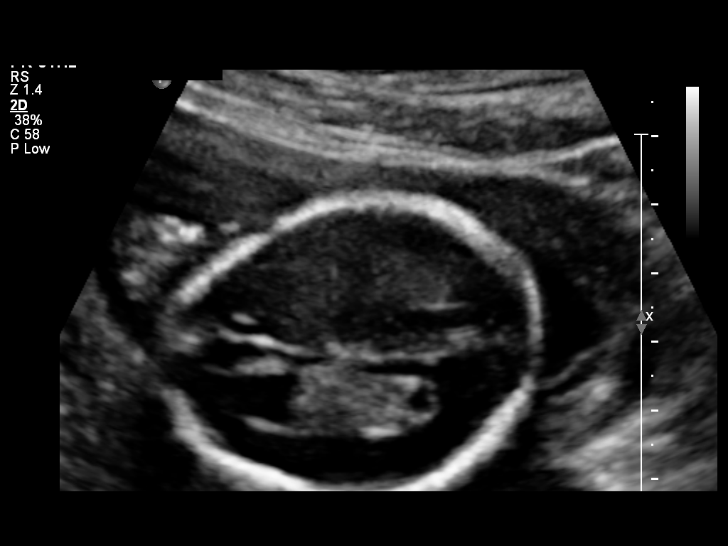
[im 37/61]
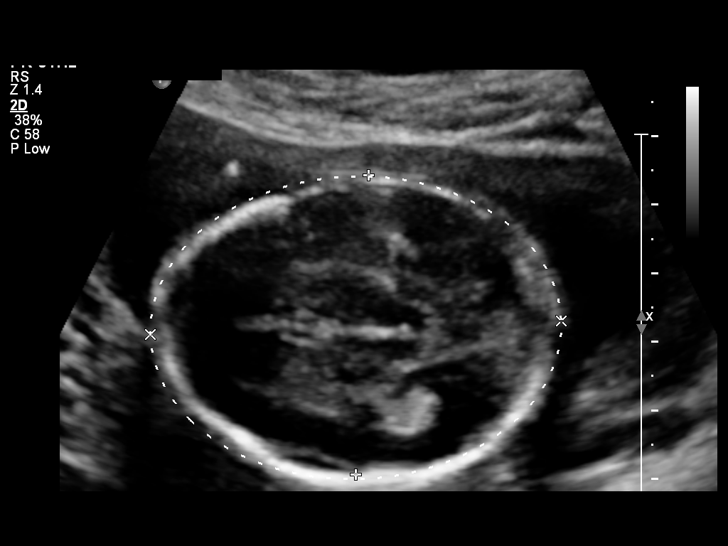
[im 46/61]
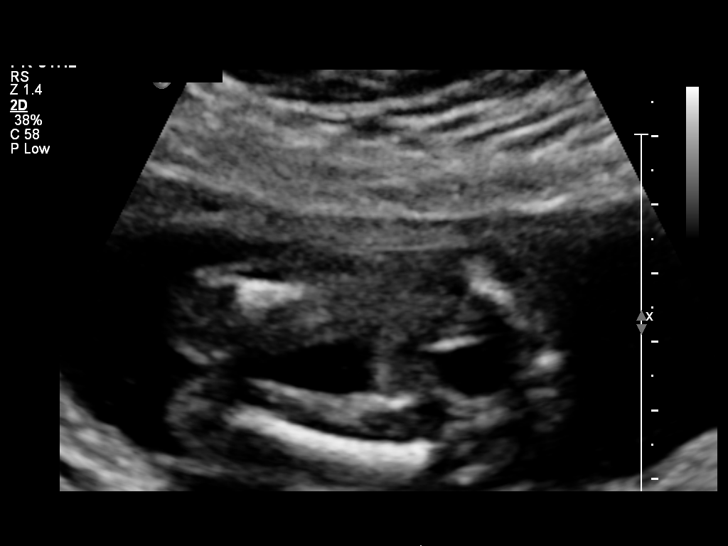
[im 52/61]
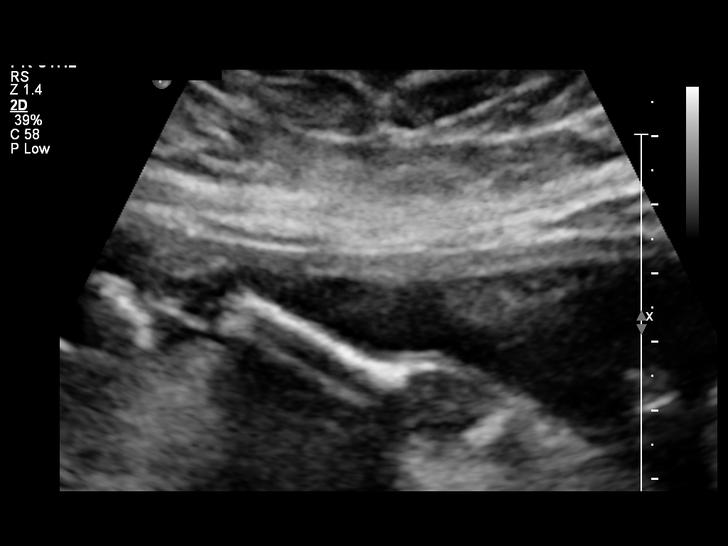
[im 58/61]
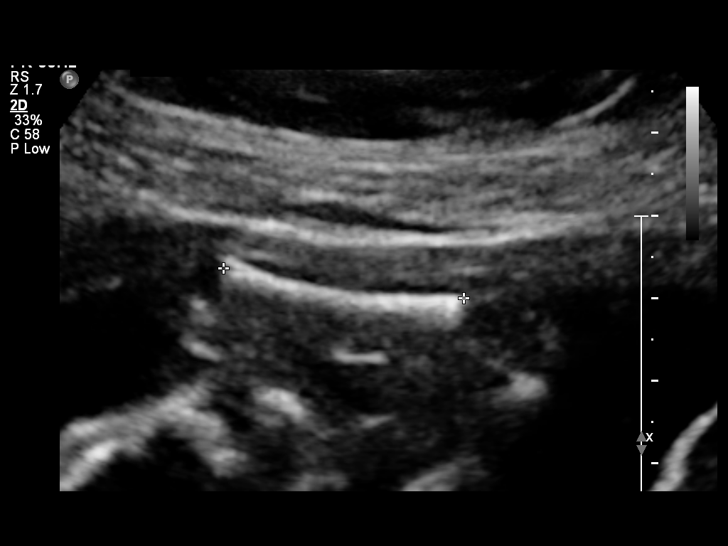

[12 of 28 positions shown; findings below may reference images not displayed]

OBSTETRICS REPORT

Service(s) Provided

 US OB COMP + 14 WK                                    76805.1
Indications

 Basic anatomic survey
 Advanced maternal age (AMA), Multigravida
 Obesity complicating pregnancy                        649.13,
 Poor obstetric history: Previous preterm delivery
 Poor obstetric history-Recurrent (habitual) abortion
 (3 consecutive ab's)
Fetal Evaluation

 Num Of Fetuses:    1
 Fetal Heart Rate:  142                         bpm
 Cardiac Activity:  Observed
 Presentation:      Cephalic
 Placenta:          Posterior, above cervical
                    os
 P. Cord            Visualized
 Insertion:

 Amniotic Fluid
 AFI FV:      Subjectively within normal limits
                                             Larg Pckt:     4.5  cm
Biometry

 BPD:     43.4  mm    G. Age:   19w 1d                CI:        69.56   70 - 86
                                                      FL/HC:      18.6   16.1 -

 HC:     166.1  mm    G. Age:   19w 2d       51  %    HC/AC:      1.16   1.09 -

 AC:       143  mm    G. Age:   19w 4d       62  %    FL/BPD:
 FL:      30.9  mm    G. Age:   19w 4d       59  %    FL/AC:      21.6   20 - 24
 HUM:     29.2  mm    G. Age:   19w 4d       60  %
 CER:     20.6  mm    G. Age:   19w 4d       60  %
 NFT:     3.22  mm

 Est. FW:     300  gm    0 lb 11 oz      52  %
Gestational Age

 LMP:           19w 1d       Date:   12/22/12                 EDD:   09/28/13
 U/S Today:     19w 3d                                        EDD:   09/26/13
 Best:          19w 1d    Det. By:   LMP  (12/22/12)          EDD:   09/28/13
Anatomy

 Cranium:          Appears normal         Aortic Arch:      Basic anatomy
                                                            exam per order
 Fetal Cavum:      Appears normal         Ductal Arch:      Basic anatomy
                                                            exam per order
 Ventricles:       Appears normal         Diaphragm:        Appears normal
 Choroid Plexus:   Left choroid           Stomach:          Appears normal, left
                   plexus cyst,  12 x
                   5  mm
                                                            sided

 Cerebellum:       Appears normal         Abdomen:          Appears normal
 Posterior Fossa:  Appears normal         Abdominal Wall:   Appears nml (cord
                                                            insert, abd wall)
 Nuchal Fold:      Appears normal         Cord Vessels:     Appears normal (3
                                                            vessel cord)
 Face:             Not well visualized    Kidneys:          Appear normal
 Lips:             Not well visualized    Bladder:          Appears normal
 Heart:            Appears normal         Spine:            Appears normal
                   (4CH, axis, and
                   situs)
 RVOT:             Appears normal         Lower             Appears normal
                                          Extremities:
 LVOT:             Appears normal         Upper             Appears normal
                                          Extremities:

 Other:  Fetus appears to be a male. Technically difficult due to maternal
         habitus and fetal position.
Targeted Anatomy

 Fetal Central Nervous System
 Lat. Ventricles:  6.0                    Cisterna Magna:
Cervix Uterus Adnexa

 Cervical Length:   4.18      cm

 Cervix:       Normal appearance by transabdominal scan.

 Left Ovary:   Not visualized.
 Right Ovary:  Not visualized.
 Adnexa:     No abnormality visualized.
Impression

 Single intrauterine gestation demonstrating an estimated
 gestational age by ultrasound of 19w 3d. This is correlated
 with expected estimated gestational age by LMP of 19w 1d.

 Choroid plexus cysts are seen in the downside (left) lateral
 ventricle. Visualized fetal anatomy otherwise appears normal
 with a complete assessment of the fetal face, heart and distal
 extremities not accomplished today. As an isolated finding,
 choroid plexus cysts are unlikely to be of clinical significance
 but follow up to complete a detailed evaluation is
 recommended to determine if this is an isolated finding given
 the incomplete assessment today and history of AMA.

 Subjectively and quantitatively normal amniotic fluid volume.

 Normal cervical length and appearance.
 questions or concerns.

## 2013-07-21 ENCOUNTER — Ambulatory Visit (INDEPENDENT_AMBULATORY_CARE_PROVIDER_SITE_OTHER): Payer: BC Managed Care – PPO | Admitting: Obstetrics and Gynecology

## 2013-07-21 ENCOUNTER — Encounter: Payer: Self-pay | Admitting: Obstetrics and Gynecology

## 2013-07-21 VITALS — BP 126/85 | Wt 218.0 lb

## 2013-07-21 DIAGNOSIS — O09523 Supervision of elderly multigravida, third trimester: Secondary | ICD-10-CM

## 2013-07-21 DIAGNOSIS — O99119 Other diseases of the blood and blood-forming organs and certain disorders involving the immune mechanism complicating pregnancy, unspecified trimester: Secondary | ICD-10-CM

## 2013-07-21 DIAGNOSIS — O09213 Supervision of pregnancy with history of pre-term labor, third trimester: Secondary | ICD-10-CM

## 2013-07-21 DIAGNOSIS — O26879 Cervical shortening, unspecified trimester: Secondary | ICD-10-CM

## 2013-07-21 DIAGNOSIS — O2623 Pregnancy care for patient with recurrent pregnancy loss, third trimester: Secondary | ICD-10-CM

## 2013-07-21 DIAGNOSIS — O26873 Cervical shortening, third trimester: Secondary | ICD-10-CM

## 2013-07-21 DIAGNOSIS — E669 Obesity, unspecified: Secondary | ICD-10-CM

## 2013-07-21 DIAGNOSIS — O09529 Supervision of elderly multigravida, unspecified trimester: Secondary | ICD-10-CM

## 2013-07-21 DIAGNOSIS — O2620 Pregnancy care for patient with recurrent pregnancy loss, unspecified trimester: Secondary | ICD-10-CM

## 2013-07-21 DIAGNOSIS — E7212 Methylenetetrahydrofolate reductase deficiency: Secondary | ICD-10-CM

## 2013-07-21 DIAGNOSIS — O09219 Supervision of pregnancy with history of pre-term labor, unspecified trimester: Secondary | ICD-10-CM

## 2013-07-21 DIAGNOSIS — D689 Coagulation defect, unspecified: Secondary | ICD-10-CM

## 2013-07-21 NOTE — Progress Notes (Signed)
Patient is doing well without any complaints. FM/PTL precautions reviewed. Continue daily progesterone. 7/29 [place] EFW 1427 gm (66%tile) CL 1.2-1.4 cm

## 2013-08-04 ENCOUNTER — Ambulatory Visit (INDEPENDENT_AMBULATORY_CARE_PROVIDER_SITE_OTHER): Payer: BC Managed Care – PPO | Admitting: Obstetrics & Gynecology

## 2013-08-04 ENCOUNTER — Observation Stay (HOSPITAL_COMMUNITY)
Admission: AD | Admit: 2013-08-04 | Discharge: 2013-08-04 | Disposition: A | Discharge status: Home / Self Care | Payer: BC Managed Care – PPO | Source: Ambulatory Visit | Attending: Obstetrics & Gynecology | Admitting: Obstetrics & Gynecology

## 2013-08-04 ENCOUNTER — Encounter (HOSPITAL_COMMUNITY): Payer: Self-pay | Admitting: *Deleted

## 2013-08-04 ENCOUNTER — Encounter: Payer: Self-pay | Admitting: Obstetrics & Gynecology

## 2013-08-04 VITALS — BP 123/77 | Wt 219.0 lb

## 2013-08-04 DIAGNOSIS — O4400 Placenta previa specified as without hemorrhage, unspecified trimester: Secondary | ICD-10-CM

## 2013-08-04 DIAGNOSIS — O4700 False labor before 37 completed weeks of gestation, unspecified trimester: Principal | ICD-10-CM | POA: Insufficient documentation

## 2013-08-04 DIAGNOSIS — O4403 Placenta previa specified as without hemorrhage, third trimester: Secondary | ICD-10-CM

## 2013-08-04 DIAGNOSIS — O09529 Supervision of elderly multigravida, unspecified trimester: Secondary | ICD-10-CM | POA: Insufficient documentation

## 2013-08-04 DIAGNOSIS — O09219 Supervision of pregnancy with history of pre-term labor, unspecified trimester: Secondary | ICD-10-CM

## 2013-08-04 DIAGNOSIS — O26879 Cervical shortening, unspecified trimester: Secondary | ICD-10-CM

## 2013-08-04 DIAGNOSIS — Z3480 Encounter for supervision of other normal pregnancy, unspecified trimester: Secondary | ICD-10-CM

## 2013-08-04 DIAGNOSIS — O26873 Cervical shortening, third trimester: Secondary | ICD-10-CM

## 2013-08-04 LAB — CBC
Hemoglobin: 11.9 g/dL — ABNORMAL LOW (ref 12.0–15.0)
MCH: 28.3 pg (ref 26.0–34.0)
MCHC: 32.9 g/dL (ref 30.0–36.0)
Platelets: 192 10*3/uL (ref 150–400)

## 2013-08-04 LAB — TYPE AND SCREEN
ABO/RH(D): O POS
Antibody Screen: NEGATIVE

## 2013-08-04 LAB — DIFFERENTIAL
Basophils Absolute: 0 10*3/uL (ref 0.0–0.1)
Basophils Relative: 0 % (ref 0–1)
Eosinophils Absolute: 0.1 10*3/uL (ref 0.0–0.7)
Monocytes Relative: 8 % (ref 3–12)
Neutro Abs: 7 10*3/uL (ref 1.7–7.7)
Neutrophils Relative %: 73 % (ref 43–77)

## 2013-08-04 MED ORDER — PROGESTERONE MICRONIZED 200 MG PO CAPS
200.0000 mg | ORAL_CAPSULE | Freq: Every day | ORAL | Status: DC
  Administered 2013-08-04: 200 mg via VAGINAL
  Filled 2013-08-04: qty 1

## 2013-08-04 MED ORDER — ZOLPIDEM TARTRATE 5 MG PO TABS: 5.0000 mg | ORAL_TABLET | Freq: Every evening | ORAL | Status: DC | PRN

## 2013-08-04 MED ORDER — NIFEDIPINE 10 MG PO CAPS
10.0000 mg | ORAL_CAPSULE | ORAL | Status: AC
  Administered 2013-08-04: 10 mg via ORAL
  Filled 2013-08-04: qty 1

## 2013-08-04 MED ORDER — NIFEDIPINE ER 30 MG PO TB24: 30.0000 mg | ORAL_TABLET | Freq: Two times a day (BID) | ORAL | Status: DC

## 2013-08-04 MED ORDER — ADULT MULTIVITAMIN W/MINERALS CH
1.0000 | ORAL_TABLET | ORAL | Status: DC
  Filled 2013-08-04 (×2): qty 1

## 2013-08-04 MED ORDER — DOCUSATE SODIUM 100 MG PO CAPS
100.0000 mg | ORAL_CAPSULE | Freq: Every day | ORAL | Status: DC
  Administered 2013-08-04: 100 mg via ORAL
  Filled 2013-08-04 (×2): qty 1

## 2013-08-04 MED ORDER — CALCIUM CARBONATE ANTACID 500 MG PO CHEW
2.0000 | CHEWABLE_TABLET | ORAL | Status: DC | PRN
  Filled 2013-08-04: qty 2

## 2013-08-04 MED ORDER — PRENATAL MULTIVITAMIN CH
1.0000 | ORAL_TABLET | Freq: Every day | ORAL | Status: DC
  Filled 2013-08-04: qty 1

## 2013-08-04 MED ORDER — VITAMIN B-6 50 MG PO TABS
50.0000 mg | ORAL_TABLET | Freq: Every day | ORAL | Status: DC
  Filled 2013-08-04: qty 1

## 2013-08-04 MED ORDER — NIFEDIPINE 10 MG PO CAPS
ORAL_CAPSULE | ORAL | Status: AC
  Administered 2013-08-04: 10 mg via ORAL
  Filled 2013-08-04: qty 1

## 2013-08-04 MED ORDER — ACETAMINOPHEN 325 MG PO TABS
650.0000 mg | ORAL_TABLET | ORAL | Status: DC | PRN
  Administered 2013-08-04: 650 mg via ORAL
  Filled 2013-08-04: qty 2

## 2013-08-04 MED ORDER — NIFEDIPINE ER 30 MG PO TB24
30.0000 mg | ORAL_TABLET | Freq: Once | ORAL | Status: AC
  Administered 2013-08-04: 30 mg via ORAL
  Filled 2013-08-04: qty 1

## 2013-08-04 MED ORDER — CALCIUM CARBONATE ANTACID 500 MG PO CHEW: 2.0000 | CHEWABLE_TABLET | Freq: Every day | ORAL | Status: DC | PRN

## 2013-08-04 MED ORDER — FOLIC ACID 1 MG PO TABS
4.0000 mg | ORAL_TABLET | Freq: Every day | ORAL | Status: DC
  Filled 2013-08-04 (×2): qty 4

## 2013-08-04 NOTE — Progress Notes (Signed)
Routine visit. She denies VB, ROM, or CTXs but she says that she is fearful everytime that she has a BM that she will have a baby also. I have recommended a cervical exam today. Based on her dilation, I have obtained cultures and sent her to [facility] asap. I spoke with Dr. Erin Fulling.

## 2013-08-04 NOTE — Progress Notes (Signed)
[id number] 

## 2013-08-04 NOTE — Discharge Summary (Signed)
Attestation of Attending Supervision of Fellow: Evaluation and management procedures were performed by the Fellow under my supervision and collaboration.  I have reviewed the Fellow's note and chart, and I agree with the management and plan.    

## 2013-08-04 NOTE — Discharge Summary (Signed)
Antenatal Physician Discharge Summary  Patient ID: Rhonda Adams MRN: [id number] DOB/AGE: [birth year] 37 y.o.  Admit date: 08/04/2013 Discharge date: 08/04/2013  Admission Diagnoses: threatened PTL  Discharge Diagnoses: the same  Prenatal Procedures: none  Intrapartum Procedures:  none  Significant Diagnostic Studies:  Results for orders placed during the hospital encounter of 08/04/13 (from the past 168 hour(s))  CBC   Collection Time    08/04/13  4:10 PM      Result Value Range   WBC 9.6  4.0 - 10.5 K/uL   RBC 4.20  3.87 - 5.11 MIL/uL   Hemoglobin 11.9 (*) 12.0 - 15.0 g/dL   HCT 82.9  56.2 - 13.0 %   MCV 86.2  78.0 - 100.0 fL   MCH 28.3  26.0 - 34.0 pg   MCHC 32.9  30.0 - 36.0 g/dL   RDW 86.5  78.4 - 69.6 %   Platelets 192  150 - 400 K/uL  DIFFERENTIAL   Collection Time    08/04/13  4:10 PM      Result Value Range   Neutrophils Relative % 73  43 - 77 %   Neutro Abs 7.0  1.7 - 7.7 K/uL   Lymphocytes Relative 19  12 - 46 %   Lymphs Abs 1.8  0.7 - 4.0 K/uL   Monocytes Relative 8  3 - 12 %   Monocytes Absolute 0.7  0.1 - 1.0 K/uL   Eosinophils Relative 1  0 - 5 %   Eosinophils Absolute 0.1  0.0 - 0.7 K/uL   Basophils Relative 0  0 - 1 %   Basophils Absolute 0.0  0.0 - 0.1 K/uL  TYPE AND SCREEN   Collection Time    08/04/13  4:10 PM      Result Value Range   ABO/RH(D) O POS     Antibody Screen NEG     Sample Expiration 08/07/2013      Treatments: procardia 10mg  x 3.    Hospital Course:  This is a 37 y.o. [id number] with IUP at [redacted]w[redacted]d admitted for threatened PTL.  She was evaluated in clinic earlier on 08/04/13 and found to be 3.5/80/-1 and was sent to [facility] for further evaluation. At the time she denied contractions, no leaking of fluid and no bleeding.  She was admitted for observation. Pt had already received BMZ on 7/3 and 7/4 and this was not repeated. GC/CHL and GBS cultures were sent.  Later on the day of admission, the pt began to have regular contractions ever  3-5 min on TOCO that she was not describing as painful. She was given nifedipine 10mg  x2 with resolution of her symptoms.  Her cervical check during this time was unchanged. That night after >10 hour of observation and >5 hours of no contractions, her SVE was still unchanged and it was felt she was stable for discharge to home.  She was sent on procardia 30mg  XL BID and will f/u in clinic on Monday.  Until that time it was requested she stay out of work. Fetal heart rate monitoring was reactive on NST x2.      Discharge Exam: BP 105/45  Pulse 110  Temp(Src) 98.1 F (36.7 C) (Oral)  Resp 18  Ht 5\' 4"  (1.626 m)  Wt 99.247 kg (218 lb 12.8 oz)  BMI 37.54 kg/m2  SpO2 100%  LMP 12/22/2012 GEN: NAD LUNGS: CTAB CV: RRR,  ABD: gravid, appropriate for GA SVE: 3.5/80/-1  Discharge Condition: good  Disposition: to home  Discharge Orders   Future Orders Complete By Expires   Discharge patient  As directed    Comments:     To home       Medication List         calcium carbonate 500 MG chewable tablet  Commonly known as:  TUMS - dosed in mg elemental calcium  Chew 2 tablets by mouth daily as needed for heartburn.     folic acid 1 MG tablet  Commonly known as:  FOLVITE  TAKE 4 TABLETS BY MOUTH EVERY DAY     multivitamin with minerals tablet  Take 1 tablet by mouth daily.     NIFEdipine 30 MG 24 hr tablet  Commonly known as:  PROCARDIA-XL/ADALAT CC  Take 1 tablet (30 mg total) by mouth 2 (two) times daily.     progesterone 200 MG capsule  Commonly known as:  PROMETRIUM  Place 200 mg vaginally at bedtime.     pyridOXINE 100 MG tablet  Commonly known as:  VITAMIN B-6  Take 50 mg by mouth daily.           Follow-up Information   Schedule an appointment as soon as possible for a visit with [facility]. (on Monday)    Specialty:  Obstetrics and Gynecology   Contact information:   [address] [place] [address] [phone]       Signed: Vale Haven M.D. 08/04/2013, 9:28 PM

## 2013-08-04 NOTE — MAU Provider Note (Signed)
Chief Complaint:  No chief complaint on file.   Rhonda Adams is a 37 y.o.  [phone] with IUP at [redacted]w[redacted]d presenting for No chief complaint on file. .  Pt states she has had a fear of having a BM due to a hx of PTL without feeling contractions. States she has been feeling fine overal.  Very rarely feels a little prssure and contractions. Doesn't feel like she is in labor. However, with her daughter, she did not feel contractions either and was completely dilated and felt like she needed a BM prior to being uncomfortable. She denies recent intercourse or dehydration. Was at a routine visit and SVE was 3.5/80/-1 and thus was sent to MAU for r/o PTL.     She is a pt at [employer].  Pt has a pregnancy complicated by a shortened cervix of 1.7cm at 24weeks. Last cervical length on 8/4 at 28wks 1.2cm.  She has been on PV progesterone. Hx of [redacted]w[redacted]d delivery and had a discussion about 17-OHP but declined.  She had BMZ on 7/2 and 7/3 for this pregnancy.      Patient states she has been having  none contractions, none vaginal bleeding, intact membranes, with active fetal movement.    Menstrual History: OB History   Grav Para Term Preterm Abortions TAB SAB Ect Mult Living   6 1 0 1 4 0 4 0 0 1       G1- SAB @6wks  G2- SAB @ 10wks with D&C G3- PTD @36 .3wks, SOL  G4- SAB G5- SAB @9wks  G6- current   Patient's last menstrual period was 12/22/2012.      Past Medical History  Diagnosis Date  . Ovarian cyst   . Migraine     Past Surgical History  Procedure Laterality Date  . Dilation and curettage of uterus    . Miscarriage    . Arthroscopic repair acl      left    Family History  Problem Relation Age of Onset  . Cancer Mother 88    BREAST  . Hypertension Father   . Heart disease Maternal Grandmother   . Diabetes Maternal Grandfather   . Stroke Paternal Grandfather   . Hearing loss Neg Hx     History  Substance Use Topics  . Smoking status: Never Smoker   . Smokeless tobacco: Not on  file  . Alcohol Use: Yes     Comment: rare, not with preg      Allergies  Allergen Reactions  . Tomasa Blase Flavor Nausea And Vomiting    Prescriptions prior to admission  Medication Sig Dispense Refill  . calcium carbonate (TUMS - DOSED IN MG ELEMENTAL CALCIUM) 500 MG chewable tablet Chew 2 tablets by mouth daily as needed for heartburn.       . folic acid (FOLVITE) 1 MG tablet TAKE 4 TABLETS BY MOUTH EVERY DAY  120 tablet  2  . Multiple Vitamins-Minerals (MULTIVITAMIN WITH MINERALS) tablet Take 1 tablet by mouth daily.      . progesterone (PROMETRIUM) 200 MG capsule Place 200 mg vaginally at bedtime.       . pyridOXINE (VITAMIN B-6) 100 MG tablet Take 50 mg by mouth daily.         Review of Systems - Negative except for what is mentioned in HPI.  Physical Exam  Blood pressure 119/62, pulse 91, temperature 98.2 F (36.8 C), temperature source Oral, resp. rate 18, height 5\' 4"  (1.626 m), weight 99.247 kg (218 lb 12.8 oz), last menstrual  period 12/22/2012, SpO2 100.00%. GENERAL: Well-developed, well-nourished female in no acute distress.  LUNGS: Clear to auscultation bilaterally.  HEART: Regular rate and rhythm. ABDOMEN: Soft, nontender, nondistended, gravid.  EXTREMITIES: Nontender, no edema, 2+ distal pulses. Cervical Exam: Dilatation 3.5cm   Effacement 80%   Station -1  Presentation: cephalic FHT:  Baseline rate 130s bpm   Variability moderate  Accelerations present   Decelerations none Contractions: irregular and occasional on toco, not felt by pt (2 in 1 hour)   Labs: No results found for this or any previous visit (from the past 24 hour(s)).  Imaging Studies:   Assessment: Rhonda Adams is  37 y.o. [phone] at [redacted]w[redacted]d presents with r/o PTL.  Plan: Admit to antenatal for longer observation If begins contracting, start tocolysis.  See H&P for further orders.    Rhonda Adams L 8/21/20143:35 PM

## 2013-08-04 NOTE — MAU Provider Note (Signed)
Attestation of Attending Supervision of Advanced Practitioner (CNM/NP): Evaluation and management procedures were performed by the Advanced Practitioner under my supervision and collaboration.  I have reviewed the Advanced Practitioner's note and chart, and I agree with the management and plan.  HARRAWAY-SMITH, Chane Cowden 7:37 PM     

## 2013-08-04 NOTE — H&P (Signed)
FACULTY PRACTICE ANTEPARTUM ADMISSION HISTORY AND PHYSICAL NOTE  Chief Complaint:  R/o PTL   Rhonda Adams is a 37 y.o.  [phone] with IUP at [redacted]w[redacted]d presenting for possible PTL .  Pt states she has had a fear of having a BM due to a hx of PTL without feeling contractions. States she has been feeling fine overall.  Very rarely feels a little prssure and contractions. Doesn't feel like she is in labor. However, with her daughter, she did not feel contractions either and was completely dilated and felt like she needed a BM prior to being uncomfortable. She denies recent intercourse or dehydration. Was at a routine visit and SVE was 3.5/80/-1 and thus was sent to MAU for r/o PTL.  Denies any feelings of contractions   She is a pt at [employer].  Pt has a pregnancy complicated by a shortened cervix of 1.7cm at 24weeks. Last cervical length on 8/4 at 28wks 1.2cm.  She has been on PV progesterone. Hx of [redacted]w[redacted]d delivery and had a discussion about 17-OHP but declined.  She had BMZ on 7/2 and 7/3 for this pregnancy.      Patient states she has been having  no contractions, no vaginal bleeding, intact membranes, with active fetal movement.    Menstrual History: OB History   Grav Para Term Preterm Abortions TAB SAB Ect Mult Living   6 1 0 1 4 0 4 0 0 1       G1- SAB @6wks  G2- SAB @ 10wks with D&C G3- PTD @36 .3wks, SOL  G4- SAB G5- SAB @9wks  G6- current   Patient's last menstrual period was 12/22/2012.      Past Medical History  Diagnosis Date  . Ovarian cyst   . Migraine     Past Surgical History  Procedure Laterality Date  . Dilation and curettage of uterus    . Miscarriage    . Arthroscopic repair acl      left    Family History  Problem Relation Age of Onset  . Cancer Mother 3    BREAST  . Hypertension Father   . Heart disease Maternal Grandmother   . Diabetes Maternal Grandfather   . Stroke Paternal Grandfather   . Hearing loss Neg Hx     History  Substance Use Topics   . Smoking status: Never Smoker   . Smokeless tobacco: Not on file  . Alcohol Use: Yes     Comment: rare, not with preg      Allergies  Allergen Reactions  . Tomasa Blase Flavor Nausea And Vomiting    Prescriptions prior to admission  Medication Sig Dispense Refill  . calcium carbonate (TUMS - DOSED IN MG ELEMENTAL CALCIUM) 500 MG chewable tablet Chew 2 tablets by mouth daily as needed for heartburn.       . folic acid (FOLVITE) 1 MG tablet TAKE 4 TABLETS BY MOUTH EVERY DAY  120 tablet  2  . Multiple Vitamins-Minerals (MULTIVITAMIN WITH MINERALS) tablet Take 1 tablet by mouth daily.      . progesterone (PROMETRIUM) 200 MG capsule Place 200 mg vaginally at bedtime.       . pyridOXINE (VITAMIN B-6) 100 MG tablet Take 50 mg by mouth daily.         Review of Systems - Negative except for what is mentioned in HPI.  Physical Exam  Blood pressure 119/62, pulse 91, temperature 98.2 F (36.8 C), temperature source Oral, resp. rate 18, height 5\' 4"  (1.626 m), weight 99.247  kg (218 lb 12.8 oz), last menstrual period 12/22/2012, SpO2 100.00%. GENERAL: Well-developed, well-nourished female in no acute distress.  LUNGS: Clear to auscultation bilaterally.  HEART: Regular rate and rhythm. ABDOMEN: Soft, nontender, nondistended, gravid.  EXTREMITIES: Nontender, no edema, 2+ distal pulses. Cervical Exam: Dilatation 3.5cm   Effacement 80%   Station -1  Presentation: cephalic FHT:  Baseline rate 130s bpm   Variability moderate  Accelerations present   Decelerations none Contractions: irregular and occasional on toco, not felt by pt (2 in 1 hour)   Labs: No results found for this or any previous visit (from the past 24 hour(s)).  Imaging Studies:   Assessment: Rhonda Adams is  37 y.o. [phone] at [redacted]w[redacted]d presents with r/o PTL. SVE unchanged currently.   Plan: -Admit to antenatal for longer observation - routine orders and diet   ** R/o PTL - will monitor on continuous toco given hx of not feeling  contractions -If begins contracting, start tocolysis with procardia   **FWB - category I tracing- baseline 130s, multiple 15x15 accels, no decels - s/p BMZ on 06/15/13 and 06/16/13 - GBS collected today in clinic and pending - q shift NST     [name] L 8/21/20143:35 PM OB fellow Faculty Practice, [facility] [place]

## 2013-08-04 NOTE — MAU Note (Signed)
Sent from office, premature dilation. Hx of PTD.

## 2013-08-05 LAB — HEPATITIS B SURFACE ANTIGEN: Hepatitis B Surface Ag: NEGATIVE

## 2013-08-05 LAB — RPR: RPR Ser Ql: NONREACTIVE

## 2013-08-05 LAB — RUBELLA SCREEN: Rubella: 0.81 Index (ref <0.90)

## 2013-08-08 ENCOUNTER — Ambulatory Visit (INDEPENDENT_AMBULATORY_CARE_PROVIDER_SITE_OTHER): Payer: Self-pay | Admitting: Obstetrics and Gynecology

## 2013-08-08 ENCOUNTER — Encounter: Payer: Self-pay | Admitting: Obstetrics and Gynecology

## 2013-08-08 VITALS — BP 123/95 | Wt 221.0 lb

## 2013-08-08 DIAGNOSIS — O2623 Pregnancy care for patient with recurrent pregnancy loss, third trimester: Secondary | ICD-10-CM

## 2013-08-08 DIAGNOSIS — O2620 Pregnancy care for patient with recurrent pregnancy loss, unspecified trimester: Secondary | ICD-10-CM

## 2013-08-08 DIAGNOSIS — E7212 Methylenetetrahydrofolate reductase deficiency: Secondary | ICD-10-CM

## 2013-08-08 DIAGNOSIS — O26873 Cervical shortening, third trimester: Secondary | ICD-10-CM

## 2013-08-08 DIAGNOSIS — O09219 Supervision of pregnancy with history of pre-term labor, unspecified trimester: Secondary | ICD-10-CM

## 2013-08-08 DIAGNOSIS — E669 Obesity, unspecified: Secondary | ICD-10-CM

## 2013-08-08 DIAGNOSIS — D689 Coagulation defect, unspecified: Secondary | ICD-10-CM

## 2013-08-08 DIAGNOSIS — O09523 Supervision of elderly multigravida, third trimester: Secondary | ICD-10-CM

## 2013-08-08 DIAGNOSIS — O09529 Supervision of elderly multigravida, unspecified trimester: Secondary | ICD-10-CM

## 2013-08-08 DIAGNOSIS — O26879 Cervical shortening, unspecified trimester: Secondary | ICD-10-CM

## 2013-08-08 DIAGNOSIS — O139 Gestational [pregnancy-induced] hypertension without significant proteinuria, unspecified trimester: Secondary | ICD-10-CM

## 2013-08-08 DIAGNOSIS — O09213 Supervision of pregnancy with history of pre-term labor, third trimester: Secondary | ICD-10-CM

## 2013-08-08 DIAGNOSIS — O163 Unspecified maternal hypertension, third trimester: Secondary | ICD-10-CM

## 2013-08-08 NOTE — Progress Notes (Signed)
Patient doing well. Reports irregular contractions. Cervical exam unchanged. Continue procardia and prometrium. Denies HA, visual disturbances, RUQ/epigastric change. RTC on Friday for repeat cervical check and BP check. Patient's husband desires for her to stay home and patient does not want. 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

## 2013-08-08 NOTE — Progress Notes (Signed)
[id number] 

## 2013-08-12 ENCOUNTER — Inpatient Hospital Stay (HOSPITAL_COMMUNITY)
Admission: AD | Admit: 2013-08-12 | Discharge: 2013-08-17 | DRG: 379 | Disposition: A | Discharge status: Home / Self Care | Payer: BC Managed Care – PPO | Source: Ambulatory Visit | Attending: Obstetrics & Gynecology | Admitting: Obstetrics & Gynecology

## 2013-08-12 ENCOUNTER — Ambulatory Visit (INDEPENDENT_AMBULATORY_CARE_PROVIDER_SITE_OTHER): Payer: BC Managed Care – PPO | Admitting: Family Medicine

## 2013-08-12 ENCOUNTER — Encounter (HOSPITAL_COMMUNITY): Payer: Self-pay | Admitting: *Deleted

## 2013-08-12 ENCOUNTER — Inpatient Hospital Stay (HOSPITAL_COMMUNITY): Payer: BC Managed Care – PPO

## 2013-08-12 ENCOUNTER — Encounter: Payer: Self-pay | Admitting: Family Medicine

## 2013-08-12 VITALS — BP 111/87 | Wt 220.0 lb

## 2013-08-12 DIAGNOSIS — O09213 Supervision of pregnancy with history of pre-term labor, third trimester: Secondary | ICD-10-CM

## 2013-08-12 DIAGNOSIS — O4700 False labor before 37 completed weeks of gestation, unspecified trimester: Secondary | ICD-10-CM

## 2013-08-12 DIAGNOSIS — O09529 Supervision of elderly multigravida, unspecified trimester: Secondary | ICD-10-CM | POA: Diagnosis present

## 2013-08-12 DIAGNOSIS — O26879 Cervical shortening, unspecified trimester: Secondary | ICD-10-CM | POA: Diagnosis present

## 2013-08-12 DIAGNOSIS — O26873 Cervical shortening, third trimester: Secondary | ICD-10-CM

## 2013-08-12 DIAGNOSIS — O09219 Supervision of pregnancy with history of pre-term labor, unspecified trimester: Secondary | ICD-10-CM

## 2013-08-12 DIAGNOSIS — O163 Unspecified maternal hypertension, third trimester: Secondary | ICD-10-CM

## 2013-08-12 DIAGNOSIS — E7212 Methylenetetrahydrofolate reductase deficiency: Secondary | ICD-10-CM

## 2013-08-12 DIAGNOSIS — O4403 Placenta previa specified as without hemorrhage, third trimester: Secondary | ICD-10-CM

## 2013-08-12 DIAGNOSIS — O2623 Pregnancy care for patient with recurrent pregnancy loss, third trimester: Secondary | ICD-10-CM

## 2013-08-12 LAB — TYPE AND SCREEN: Antibody Screen: NEGATIVE

## 2013-08-12 MED ORDER — DOCUSATE SODIUM 100 MG PO CAPS
100.0000 mg | ORAL_CAPSULE | Freq: Every day | ORAL | Status: DC
  Administered 2013-08-13 – 2013-08-17 (×5): 100 mg via ORAL
  Filled 2013-08-12 (×7): qty 1

## 2013-08-12 MED ORDER — PRENATAL MULTIVITAMIN CH
1.0000 | ORAL_TABLET | Freq: Every day | ORAL | Status: DC
  Administered 2013-08-13 – 2013-08-17 (×5): 1 via ORAL
  Filled 2013-08-12 (×7): qty 1

## 2013-08-12 MED ORDER — CALCIUM CARBONATE ANTACID 500 MG PO CHEW
2.0000 | CHEWABLE_TABLET | ORAL | Status: DC | PRN
  Filled 2013-08-12: qty 2

## 2013-08-12 MED ORDER — MAGNESIUM SULFATE BOLUS VIA INFUSION
4.0000 g | Freq: Once | INTRAVENOUS | Status: AC
  Administered 2013-08-12: 4 g via INTRAVENOUS
  Filled 2013-08-12: qty 500

## 2013-08-12 MED ORDER — TERBUTALINE SULFATE 1 MG/ML IJ SOLN
0.2500 mg | Freq: Once | INTRAMUSCULAR | Status: AC
  Administered 2013-08-12: 0.25 mg via SUBCUTANEOUS
  Filled 2013-08-12: qty 1

## 2013-08-12 MED ORDER — NIFEDIPINE 10 MG PO CAPS
10.0000 mg | ORAL_CAPSULE | ORAL | Status: AC
  Administered 2013-08-12: 10 mg via ORAL
  Filled 2013-08-12: qty 1

## 2013-08-12 MED ORDER — MAGNESIUM SULFATE 40 G IN LACTATED RINGERS - SIMPLE
2.0000 g/h | INTRAVENOUS | Status: AC
  Administered 2013-08-12 – 2013-08-13 (×2): 2 g/h via INTRAVENOUS
  Filled 2013-08-12 (×3): qty 500

## 2013-08-12 MED ORDER — ZOLPIDEM TARTRATE 5 MG PO TABS: 5.0000 mg | ORAL_TABLET | Freq: Every evening | ORAL | Status: DC | PRN

## 2013-08-12 MED ORDER — LACTATED RINGERS IV SOLN
INTRAVENOUS | Status: DC
  Administered 2013-08-12 – 2013-08-14 (×4): via INTRAVENOUS

## 2013-08-12 MED ORDER — BETAMETHASONE SOD PHOS & ACET 6 (3-3) MG/ML IJ SUSP
12.0000 mg | Freq: Once | INTRAMUSCULAR | Status: AC
  Administered 2013-08-12: 12 mg via INTRAMUSCULAR
  Filled 2013-08-12: qty 2

## 2013-08-12 MED ORDER — ACETAMINOPHEN 325 MG PO TABS: 650.0000 mg | ORAL_TABLET | ORAL | Status: DC | PRN

## 2013-08-12 NOTE — Patient Instructions (Addendum)
Pregnancy - Third Trimester The third trimester of pregnancy (the last 3 months) is a period of the most rapid growth for you and your baby. The baby approaches a length of 20 inches and a weight of 6 to 10 pounds. The baby is adding on fat and getting ready for life outside your body. While inside, babies have periods of sleeping and waking, sucking thumbs, and hiccuping. You can often feel small contractions of the uterus. This is false labor. It is also called Braxton-Hicks contractions. This is like a practice for labor. The usual problems in this stage of pregnancy include more difficulty breathing, swelling of the hands and feet from water retention, and having to urinate more often because of the uterus and baby pressing on your bladder.  PRENATAL EXAMS  Blood work may continue to be done during prenatal exams. These tests are done to check on your health and the probable health of your baby. Blood work is used to follow your blood levels (hemoglobin). Anemia (low hemoglobin) is common during pregnancy. Iron and vitamins are given to help prevent this. You may also continue to be checked for diabetes. Some of the past blood tests may be done again.  The size of the uterus is measured during each visit. This makes sure your baby is growing properly according to your pregnancy dates.  Your blood pressure is checked every prenatal visit. This is to make sure you are not getting toxemia.  Your urine is checked every prenatal visit for infection, diabetes, and protein.  Your weight is checked at each visit. This is done to make sure gains are happening at the suggested rate and that you and your baby are growing normally.  Sometimes, an ultrasound is performed to confirm the position and the proper growth and development of the baby. This is a test done that bounces harmless sound waves off the baby so your caregiver can more accurately determine a due date.  Discuss the type of pain medicine and  anesthesia you will have during your labor and delivery.  Discuss the possibility and anesthesia if a cesarean section might be necessary.  Inform your caregiver if there is any mental or physical violence at home. Sometimes, a specialized non-stress test, contraction stress test, and biophysical profile are done to make sure the baby is not having a problem. Checking the amniotic fluid surrounding the baby is called an amniocentesis. The amniotic fluid is removed by sticking a needle into the belly (abdomen). This is sometimes done near the end of pregnancy if an early delivery is required. In this case, it is done to help make sure the baby's lungs are mature enough for the baby to live outside of the womb. If the lungs are not mature and it is unsafe to deliver the baby, an injection of cortisone medicine is given to the mother 1 to 2 days before the delivery. This helps the baby's lungs mature and makes it safer to deliver the baby. CHANGES OCCURING IN THE THIRD TRIMESTER OF PREGNANCY Your body goes through many changes during pregnancy. They vary from person to person. Talk to your caregiver about changes you notice and are concerned about.  During the last trimester, you have probably had an increase in your appetite. It is normal to have cravings for certain foods. This varies from person to person and pregnancy to pregnancy.  You may begin to get stretch marks on your hips, abdomen, and breasts. These are normal changes in the body   during pregnancy. There are no exercises or medicines to take which prevent this change.  Constipation may be treated with a stool softener or adding bulk to your diet. Drinking lots of fluids, fiber in vegetables, fruits, and whole grains are helpful.  Exercising is also helpful. If you have been very active up until your pregnancy, most of these activities can be continued during your pregnancy. If you have been less active, it is helpful to start an exercise  program such as walking. Consult your caregiver before starting exercise programs.  Avoid all smoking, alcohol, non-prescribed drugs, herbs and "street drugs" during your pregnancy. These chemicals affect the formation and growth of the baby. Avoid chemicals throughout the pregnancy to ensure the delivery of a healthy infant.  Backache, varicose veins, and hemorrhoids may develop or get worse.  You will tire more easily in the third trimester, which is normal.  The baby's movements may be stronger and more often.  You may become short of breath easily.  Your belly button may stick out.  A yellow discharge may leak from your breasts called colostrum.  You may have a bloody mucus discharge. This usually occurs a few days to a week before labor begins. HOME CARE INSTRUCTIONS   Keep your caregiver's appointments. Follow your caregiver's instructions regarding medicine use, exercise, and diet.  During pregnancy, you are providing food for you and your baby. Continue to eat regular, well-balanced meals. Choose foods such as meat, fish, milk and other low fat dairy products, vegetables, fruits, and whole-grain breads and cereals. Your caregiver will tell you of the ideal weight gain.  A physical sexual relationship may be continued throughout pregnancy if there are no other problems such as early (premature) leaking of amniotic fluid from the membranes, vaginal bleeding, or belly (abdominal) pain.  Exercise regularly if there are no restrictions. Check with your caregiver if you are unsure of the safety of your exercises. Greater weight gain will occur in the last 2 trimesters of pregnancy. Exercising helps:  Control your weight.  Get you in shape for labor and delivery.  You lose weight after you deliver.  Rest a lot with legs elevated, or as needed for leg cramps or low back pain.  Wear a good support or jogging bra for breast tenderness during pregnancy. This may help if worn during  sleep. Pads or tissues may be used in the bra if you are leaking colostrum.  Do not use hot tubs, steam rooms, or saunas.  Wear your seat belt when driving. This protects you and your baby if you are in an accident.  Avoid raw meat, cat litter boxes and soil used by cats. These carry germs that can cause birth defects in the baby.  It is easier to leak urine during pregnancy. Tightening up and strengthening the pelvic muscles will help with this problem. You can practice stopping your urination while you are going to the bathroom. These are the same muscles you need to strengthen. It is also the muscles you would use if you were trying to stop from passing gas. You can practice tightening these muscles up 10 times a set and repeating this about 3 times per day. Once you know what muscles to tighten up, do not perform these exercises during urination. It is more likely to cause an infection by backing up the urine.  Ask for help if you have financial, counseling, or nutritional needs during pregnancy. Your caregiver will be able to offer counseling for these   needs as well as refer you for other special needs.  Make a list of emergency phone numbers and have them available.  Plan on getting help from family or friends when you go home from the hospital.  Make a trial run to the hospital.  Take prenatal classes with the father to understand, practice, and ask questions about the labor and delivery.  Prepare the baby's room or nursery.  Do not travel out of the city unless it is absolutely necessary and with the advice of your caregiver.  Wear only low or no heal shoes to have better balance and prevent falling. MEDICINES AND DRUG USE IN PREGNANCY  Take prenatal vitamins as directed. The vitamin should contain 1 milligram of folic acid. Keep all vitamins out of reach of children. Only a couple vitamins or tablets containing iron may be fatal to a baby or young child when ingested.  Avoid use  of all medicines, including herbs, over-the-counter medicines, not prescribed or suggested by your caregiver. Only take over-the-counter or prescription medicines for pain, discomfort, or fever as directed by your caregiver. Do not use aspirin, ibuprofen or naproxen unless approved by your caregiver.  Let your caregiver also know about herbs you may be using.  Alcohol is related to a number of birth defects. This includes fetal alcohol syndrome. All alcohol, in any form, should be avoided completely. Smoking will cause low birth rate and premature babies.  Illegal drugs are very harmful to the baby. They are absolutely forbidden. A baby born to an addicted mother will be addicted at birth. The baby will go through the same withdrawal an adult does. SEEK MEDICAL CARE IF: You have any concerns or worries during your pregnancy. It is better to call with your questions if you feel they cannot wait, rather than worry about them. SEEK IMMEDIATE MEDICAL CARE IF:   An unexplained oral temperature above 102 F (38.9 C) develops, or as your caregiver suggests.  You have leaking of fluid from the vagina. If leaking membranes are suspected, take your temperature and tell your caregiver of this when you call.  There is vaginal spotting, bleeding or passing clots. Tell your caregiver of the amount and how many pads are used.  You develop a bad smelling vaginal discharge with a change in the color from clear to white.  You develop vomiting that lasts more than 24 hours.  You develop chills or fever.  You develop shortness of breath.  You develop burning on urination.  You loose more than 2 pounds of weight or gain more than 2 pounds of weight or as suggested by your caregiver.  You notice sudden swelling of your face, hands, and feet or legs.  You develop belly (abdominal) pain. Round ligament discomfort is a common non-cancerous (benign) cause of abdominal pain in pregnancy. Your caregiver still  must evaluate you.  You develop a severe headache that does not go away.  You develop visual problems, blurred or double vision.  If you have not felt your baby move for more than 1 hour. If you think the baby is not moving as much as usual, eat something with sugar in it and lie down on your left side for an hour. The baby should move at least 4 to 5 times per hour. Call right away if your baby moves less than that.  You fall, are in a car accident, or any kind of trauma.  There is mental or physical violence at home. Document Released: 11/25/2001   Document Revised: 08/25/2012 Document Reviewed: 05/30/2009 [employer] Patient Information 2014 [employer]  Breastfeeding A change in hormones during your pregnancy causes growth of your breast tissue and an increase in number and size of milk ducts. The hormone prolactin allows proteins, sugars, and fats from your blood supply to make breast milk in your milk-producing glands. The hormone progesterone prevents breast milk from being released before the birth of your baby. After the birth of your baby, your progesterone level decreases allowing breast milk to be released. Thoughts of your baby, as well as his or her sucking or crying, can stimulate the release of milk from the milk-producing glands. Deciding to breastfeed (nurse) is one of the best choices you can make for you and your baby. The information that follows gives a brief review of the benefits, as well as other important skills to know about breastfeeding. BENEFITS OF BREASTFEEDING For your baby  The first milk (colostrum) helps your baby's digestive system function better.   There are antibodies in your milk that help your baby fight off infections.   Your baby has a lower incidence of asthma, allergies, and sudden infant death syndrome (SIDS).   The nutrients in breast milk are better for your baby than infant formulas.  Breast milk improves your baby's brain development.    Your baby will have less gas, colic, and constipation.  Your baby is less likely to develop other conditions, such as childhood obesity, asthma, or diabetes mellitus. For you  Breastfeeding helps develop a very special bond between you and your baby.   Breastfeeding is convenient, always available at the correct temperature, and costs nothing.   Breastfeeding helps to burn calories and helps you lose the weight gained during pregnancy.   Breastfeeding makes your uterus contract back down to normal size faster and slows bleeding following delivery.   Breastfeeding mothers have a lower risk of developing osteoporosis or breast or ovarian cancer later in life.  BREASTFEEDING FREQUENCY  A healthy, full-term baby may breastfeed as often as every hour or space his or her feedings to every 3 hours. Breastfeeding frequency will vary from baby to baby.   Newborns should be fed no less than every 2 3 hours during the day and every 4 5 hours during the night. You should breastfeed a minimum of 8 feedings in a 24 hour period.  Awaken your baby to breastfeed if it has been 3 4 hours since the last feeding.  Breastfeed when you feel the need to reduce the fullness of your breasts or when your newborn shows signs of hunger. Signs that your baby may be hungry include:  Increased alertness or activity.  Stretching.  Movement of the head from side to side.  Movement of the head and opening of the mouth when the corner of the mouth or cheek is stroked (rooting).  Increased sucking sounds, smacking lips, cooing, sighing, or squeaking.  Hand-to-mouth movements.  Increased sucking of fingers or hands.  Fussing.  Intermittent crying.  Signs of extreme hunger will require calming and consoling before you try to feed your baby. Signs of extreme hunger may include:  Restlessness.  A loud, strong cry.  Screaming.  Frequent feeding will help you make more milk and will help prevent  problems, such as sore nipples and engorgement of the breasts.  BREASTFEEDING   Whether lying down or sitting, be sure that the baby's abdomen is facing your abdomen.   Support your breast with 4 fingers under your breast   and your thumb above your nipple. Make sure your fingers are well away from your nipple and your baby's mouth.   Stroke your baby's lips gently with your finger or nipple.   When your baby's mouth is open wide enough, place all of your nipple and as much of the colored area around your nipple (areola) as possible into your baby's mouth.  More areola should be visible above his or her upper lip than below his or her lower lip.  Your baby's tongue should be between his or her lower gum and your breast.  Ensure that your baby's mouth is correctly positioned around the nipple (latched). Your baby's lips should create a seal on your breast.  Signs that your baby has effectively latched onto your nipple include:  Tugging or sucking without pain.  Swallowing heard between sucks.  Absent click or smacking sound.  Muscle movement above and in front of his or her ears with sucking.  Your baby must suck about 2 3 minutes in order to get your milk. Allow your baby to feed on each breast as long as he or she wants. Nurse your baby until he or she unlatches or falls asleep at the first breast, then offer the second breast.  Signs that your baby is full and satisfied include:  A gradual decrease in the number of sucks or complete cessation of sucking.  Falling asleep.  Extension or relaxation of his or her body.  Retention of a small amount of milk in his or her mouth.  Letting go of your breast by himself or herself.  Signs of effective breastfeeding in you include:  Breasts that have increased firmness, weight, and size prior to feeding.  Breasts that are softer after nursing.  Increased milk volume, as well as a change in milk consistency and color by the 5th  day of breastfeeding.  Breast fullness relieved by breastfeeding.  Nipples are not sore, cracked, or bleeding.  If needed, break the suction by putting your finger into the corner of your baby's mouth and sliding your finger between his or her gums. Then, remove your breast from his or her mouth.  It is common for babies to spit up a small amount after a feeding.  Babies often swallow air during feeding. This can make babies fussy. Burping your baby between breasts can help with this.  Vitamin D supplements are recommended for babies who get only breast milk.  Avoid using a pacifier during your baby's first 4 6 weeks.  Avoid supplemental feedings of water, formula, or juice in place of breastfeeding. Breast milk is all the food your baby needs. It is not necessary for your baby to have water or formula. Your breasts will make more milk if supplemental feedings are avoided during the early weeks. HOW TO TELL WHETHER YOUR BABY IS GETTING ENOUGH BREAST MILK Wondering whether or not your baby is getting enough milk is a common concern among mothers. You can be assured that your baby is getting enough milk if:   Your baby is actively sucking and you hear swallowing.   Your baby seems relaxed and satisfied after a feeding.   Your baby nurses at least 8 12 times in a 24 hour time period.  During the first 3 5 days of age:  Your baby is wetting at least 3 5 diapers in a 24 hour period. The urine should be clear and pale yellow.  Your baby is having at least 3 4 stools in   a 24 hour period. The stool should be soft and yellow.  At 5 7 days of age, your baby is having at least 3 6 stools in a 24 hour period. The stool should be seedy and yellow by 5 days of age.  Your baby has a weight loss less than 7 10% during the first 3 days of age.  Your baby does not lose weight after 3 7 days of age.  Your baby gains 4 7 ounces each week after he or she is 4 days of age.  Your baby gains weight  by 5 days of age and is back to birth weight within 2 weeks. ENGORGEMENT In the first week after your baby is born, you may experience extremely full breasts (engorgement). When engorged, your breasts may feel heavy, warm, or tender to the touch. Engorgement peaks within 24 48 hours after delivery of your baby.  Engorgement may be reduced by:  Continuing to breastfeed.  Increasing the frequency of breastfeeding.  Taking warm showers or applying warm, moist heat to your breasts just before each feeding. This increases circulation and helps the milk flow.   Gently massaging your breast before and during the feedings. With your fingertips, massage from your chest wall towards your nipple in a circular motion.   Ensuring that your baby empties at least one breast at every feeding. It also helps to start the next feeding on the opposite breast.   Expressing breast milk by hand or by using a breast pump to empty the breasts if your baby is sleepy, or not nursing well. You may also want to express milk if you are returning to work oryou feel you are getting engorged.  Ensuring your baby is latched on and positioned properly while breastfeeding. If you follow these suggestions, your engorgement should improve in 24 48 hours. If you are still experiencing difficulty, call your lactation consultant or caregiver.  CARING FOR YOURSELF Take care of your breasts.  Bathe or shower daily.   Avoid using soap on your nipples.   Wear a supportive bra. Avoid wearing underwire style bras.  Air dry your nipples for a 3 4minutes after each feeding.   Use only cotton bra pads to absorb breast milk leakage. Leaking of breast milk between feedings is normal.   Use only pure lanolin on your nipples after nursing. You do not need to wash it off before feeding your baby again. Another option is to express a few drops of breast milk and gently massage that milk into your nipples.  Continue breast  self-awareness checks. Take care of yourself.  Eat healthy foods. Alternate 3 meals with 3 snacks.  Avoid foods that you notice affect your baby in a bad way.  Drink milk, fruit juice, and water to satisfy your thirst (about 8 glasses a day).   Rest often, relax, and take your prenatal vitamins to prevent fatigue, stress, and anemia.  Avoid chewing and smoking tobacco.  Avoid alcohol and drug use.  Take over-the-counter and prescribed medicine only as directed by your caregiver or pharmacist. You should always check with your caregiver or pharmacist before taking any new medicine, vitamin, or herbal supplement.  Know that pregnancy is possible while breastfeeding. If desired, talk to your caregiver about family planning and safe birth control methods that may be used while breastfeeding. SEEK MEDICAL CARE IF:   You feel like you want to stop breastfeeding or have become frustrated with breastfeeding.  You have painful breasts or nipples.    Your nipples are cracked or bleeding.  Your breasts are red, tender, or warm.  You have a swollen area on either breast.  You have a fever or chills.  You have nausea or vomiting.  You have drainage from your nipples.  Your breasts do not become full before feedings by the 5th day after delivery.  You feel sad and depressed.  Your baby is too sleepy to eat well.  Your baby is having trouble sleeping.   Your baby is wetting less than 3 diapers in a 24 hour period.  Your baby has less than 3 stools in a 24 hour period.  Your baby's skin or the white part of his or her eyes becomes more yellow.   Your baby is not gaining weight by 71 days of age. MAKE SURE YOU:   Understand these instructions.  Will watch your condition.  Will get help right away if you are not doing well or get worse. Document Released: 12/01/2005 Document Revised: 08/25/2012 Document Reviewed: 07/07/2012 [facility] Patient Information 2014 [place],  [place]. Preterm Labor Preterm labor is when labor starts at less than 37 weeks of pregnancy. The normal length of a pregnancy is 39 to 41 weeks. CAUSES Often, there is no identifiable underlying cause as to why a woman goes into preterm labor. However, one of the most common known causes of preterm labor is infection. Infections of the uterus, cervix, vagina, amniotic sac, bladder, kidney, or even the lungs (pneumonia) can cause labor to start. Other causes of preterm labor include:  Urogenital infections, such as yeast infections and bacterial vaginosis.  Uterine abnormalities (uterine shape, uterine septum, fibroids, bleeding from the placenta).  A cervix that has been operated on and opens prematurely.  Malformations in the baby.  Multiple gestations (twins, triplets, and so on).  Breakage of the amniotic sac. Additional risk factors for preterm labor include:  Previous history of preterm labor.  Premature rupture of membranes (PROM).  A placenta that covers the opening of the cervix (placenta previa).  A placenta that separates from the uterus (placenta abruption).  A cervix that is too weak to hold the baby in the uterus (incompetence cervix).  Having too much fluid in the amniotic sac (polyhydramnios).  Taking illegal drugs or smoking while pregnant.  Not gaining enough weight while pregnant.  Women younger than 44 and older than 37 years old.  Low socioeconomic status.  African-American ethnicity. SYMPTOMS Signs and symptoms of preterm labor include:  Menstrual-like cramps.  Contractions that are 30 to 70 seconds apart, become very regular, closer together, and are more intense and painful.  Contractions that start on the top of the uterus and spread down to the lower abdomen and back.  A sense of increased pelvic pressure or back pain.  A watery or bloody discharge that comes from the vagina. DIAGNOSIS  A diagnosis can be confirmed by:  A vaginal  exam.  An ultrasound of the cervix.  Sampling (swabbing) cervico-vaginal secretions. These samples can be tested for the presence of fetal fibronectin. This is a protein found in cervical discharge which is associated with preterm labor.  Fetal monitoring. TREATMENT  Depending on the length of the pregnancy and other circumstances, a caregiver may suggest bed rest. If necessary, there are medicines that can be given to stop contractions and to quicken fetal lung maturity. If labor happens before 34 weeks of pregnancy, a prolonged hospital stay may be recommended. Treatment depends on the condition of both the mother and baby.  PREVENTION There are some things a mother can do to lower the risk of preterm labor in future pregnancies. A woman can:   Stop smoking.  Maintain healthy weight gain and avoid chemicals and drugs that are not necessary.  Be watchful for any type of infection.  Inform her caregiver if she has a known history of preterm labor. Document Released: 02/21/2004 Document Revised: 02/23/2012 Document Reviewed: 03/28/2011 [facility] Patient Information 2014 [place], [place].

## 2013-08-12 NOTE — MAU Note (Signed)
C/o ucs since 1000 this am after her OB appointment; SVE today in OB's office was 3.5/ 100/-1;

## 2013-08-12 NOTE — Progress Notes (Signed)
Rhonda Adams is a 37 y.o. [phone] at [redacted]w[redacted]d by LMP admitted for Preterm labor  Subjective: Comfortable. Feeling most contractions. FM+   Objective: BP 124/64  Pulse 106  Temp(Src) 98.3 F (36.8 C) (Oral)  Resp 20  Ht 5\' 4"  (1.626 m)  Wt 99.791 kg (220 lb)  BMI 37.74 kg/m2  LMP 12/22/2012   Total I/O In: 526.7 [P.O.:240; I.V.:286.7] Out: 550 [Urine:550]  FHT:  FHR: 125 bpm, variability: moderate,  accelerations:  Present,  decelerations:  Absent UC:   irregular, every 4-6 minutes SVE:   Dilation: 6 @ 1312 Exam by:: Roney Marion CNM  Labs: Lab Results  Component Value Date   WBC 9.6 08/04/2013   HGB 11.9* 08/04/2013   HCT 36.2 08/04/2013   MCV 86.2 08/04/2013   PLT 192 08/04/2013    Assessment / Plan: [id number] at [redacted]w[redacted]d with advanced maternal age, currently in preterm labor. Received one dose of BMZ IM.  Labor: Continue IV magnesium sulfate; 2nd dose of BMZ 24 hours after 1st dose Preeclampsia:  n/a Fetal Wellbeing:  Category I Pain Control:  Labor support without medications I/D:  GBS negative  Jacquelin Hawking, MD 08/12/2013, 6:33 PM

## 2013-08-12 NOTE — MAU Note (Signed)
Went into dr this morning, was a little more dilated. Completely effaced and the head was lower.  Was told if started contracting to come in. Now is contracting.  Hx of rapid and preterm labor.

## 2013-08-12 NOTE — Progress Notes (Signed)
No bleeding, no LOF--irregular contractions. S/p BMZ Labor precautions Out of work for now--continue Prometrium and Procardia.

## 2013-08-12 NOTE — Progress Notes (Signed)
P-110 

## 2013-08-12 NOTE — H&P (Signed)
Rhonda Adams is a 37 y.o. female presenting for contractions.  Seen in clinic this AM, 3-4 cm.  Hx of preterm delivery at 36 weeks.  No report of vaginal bleeding or leaking of fluid.  Maternal Medical History:  Reason for admission: Contractions.   Contractions: Onset was 3-5 hours ago.   Frequency: regular.   Perceived severity is moderate.    Fetal activity: Perceived fetal activity is normal.   Last perceived fetal movement was within the past hour.    Prenatal complications: Preterm labor.   No bleeding.     OB History   Grav Para Term Preterm Abortions TAB SAB Ect Mult Living   6 1 0 1 4 0 4 0 0 1      Past Medical History  Diagnosis Date  . Ovarian cyst   . Migraine    Past Surgical History  Procedure Laterality Date  . Dilation and curettage of uterus    . Miscarriage    . Arthroscopic repair acl      left   Family History: family history includes Cancer (age of onset: 79) in her mother; Diabetes in her maternal grandfather; Heart disease in her maternal grandmother; Hypertension in her father; Stroke in her paternal grandfather. There is no history of Hearing loss. Social History:  reports that she has never smoked. She does not have any smokeless tobacco history on file. She reports that  drinks alcohol. She reports that she does not use illicit drugs.   Prenatal Transfer Tool  Maternal Diabetes: No Genetic Screening: Declined Maternal Ultrasounds/Referrals: Normal Fetal Ultrasounds or other Referrals:  None Maternal Substance Abuse:  No Significant Maternal Medications:  Meds include: Progesterone Significant Maternal Lab Results:  Lab values include: Group B Strep negative Other Comments:  MTHFR homozygous C677T mutation complicating pregnancy  Review of Systems  Gastrointestinal: Positive for abdominal pain (contractions).  All other systems reviewed and are negative.    Dilation: 6 Exam by:: W Muhammad CNM Blood pressure 128/74, pulse 100,  temperature 98.6 F (37 C), resp. rate 18, last menstrual period 12/22/2012. Maternal Exam:  Uterine Assessment: Contraction strength is moderate.  Contraction frequency is regular.   Abdomen: Estimated fetal weight is 5-6 lbs.    Introitus: Normal vulva. Normal vagina.  Vaginal discharge: mucusy.    Fetal Exam Fetal Monitor Review: Baseline rate: 120's.  Variability: moderate (6-25 bpm).   Pattern: accelerations present.    Fetal State Assessment: Category I - tracings are normal.     Physical Exam  Constitutional: She is oriented to person, place, and time. She appears well-developed and well-nourished. No distress.  HENT:  Head: Normocephalic.  Neck: Normal range of motion. Neck supple.  Cardiovascular: Normal rate, regular rhythm and normal heart sounds.   Respiratory: Effort normal and breath sounds normal.  GI: Soft. There is no tenderness.  Genitourinary: No bleeding around the vagina. Vaginal discharge: mucusy.  Musculoskeletal: Normal range of motion.  Neurological: She is alert and oriented to person, place, and time.  Skin: Skin is warm and dry.    Prenatal labs: ABO, Rh: --/--/O POS (08/21 1610) Antibody: NEG (08/21 1610) Rubella: 0.81 (08/21 1610) RPR: NON REACTIVE (08/21 1610)  HBsAg: NEGATIVE (08/21 1610)  HIV: NON REACTIVE (07/22 0947)  GBS:     Assessment/Plan [id number] at [redacted]w[redacted]d - Preterm Labor  GBS negative  Advanced Maternal Age   Plan:  Admit to [facility]  IV Magnesium Sulfate  BMZ IM  Close observation  [facility] 08/12/2013, 1:51 PM

## 2013-08-12 NOTE — Assessment & Plan Note (Signed)
Continued PTL with advanced cervical dilation.

## 2013-08-12 NOTE — MAU Provider Note (Signed)
History     CSN: [id number]  Arrival date and time: 08/12/13 1132   First Provider Initiated Contact with Patient 08/12/13 1302      Chief Complaint  Patient presents with  . Labor Eval   HPI  Pt is a [id number] at [redacted]w[redacted]d here report of contractions that started after cervical check today in office.  No report of vaginal bleeding or leaking of fluid.  +fetal movement.  Hx of BMZ injections at 25 wks.     Past Medical History  Diagnosis Date  . Ovarian cyst   . Migraine     Past Surgical History  Procedure Laterality Date  . Dilation and curettage of uterus    . Miscarriage    . Arthroscopic repair acl      left    Family History  Problem Relation Age of Onset  . Cancer Mother 63    BREAST  . Hypertension Father   . Heart disease Maternal Grandmother   . Diabetes Maternal Grandfather   . Stroke Paternal Grandfather   . Hearing loss Neg Hx     History  Substance Use Topics  . Smoking status: Never Smoker   . Smokeless tobacco: Not on file  . Alcohol Use: Yes     Comment: rare, not with preg    Allergies:  Allergies  Allergen Reactions  . Tomasa Blase Flavor Nausea And Vomiting    Prescriptions prior to admission  Medication Sig Dispense Refill  . calcium carbonate (TUMS - DOSED IN MG ELEMENTAL CALCIUM) 500 MG chewable tablet Chew 2 tablets by mouth daily as needed for heartburn.       . folic acid (FOLVITE) 1 MG tablet TAKE 4 TABLETS BY MOUTH EVERY DAY  120 tablet  2  . Multiple Vitamins-Minerals (MULTIVITAMIN WITH MINERALS) tablet Take 1 tablet by mouth daily.      Marland Kitchen NIFEdipine (PROCARDIA-XL/ADALAT CC) 30 MG 24 hr tablet Take 1 tablet (30 mg total) by mouth 2 (two) times daily.  60 tablet  1  . progesterone (PROMETRIUM) 200 MG capsule Place 200 mg vaginally at bedtime.       . pyridOXINE (VITAMIN B-6) 100 MG tablet Take 50 mg by mouth daily.         Review of Systems  Gastrointestinal: Positive for abdominal pain (contractions).   Physical Exam   Blood  pressure 128/74, pulse 100, temperature 98.6 F (37 C), resp. rate 18, last menstrual period 12/22/2012.  Physical Exam  Constitutional: She is oriented to person, place, and time. She appears well-developed and well-nourished. No distress.  HENT:  Head: Normocephalic.  Neck: Normal range of motion. Neck supple.  Cardiovascular: Normal rate, regular rhythm and normal heart sounds.   Respiratory: Effort normal and breath sounds normal.  GI: Soft. There is no tenderness.  Genitourinary: No bleeding around the vagina. Vaginal discharge (mucusy) found.  Musculoskeletal: Normal range of motion. She exhibits edema (trace bilat pedal).  Neurological: She is alert and oriented to person, place, and time.  Skin: Skin is warm and dry.   Dilation: 6 Presentation: Vertex Exam by:: Roney Marion CNM   MAU Course  Procedures  No results found for this or any previous visit (from the past 24 hour(s)).  Consulted with Dr. Marice Potter > reviewed HPI/exam/OB hx/prior BMZ > admit to BS, repeat BMZ and begin magnesium sulfate Assessment and Plan  [id number] at [redacted]w[redacted]d - Preterm Labor GBS negative Advanced Maternal Age  Plan: Admit to [facility] IV Magnesium Sulfate BMZ IM Close  observation   [facility] 08/12/2013, 1:04 PM

## 2013-08-13 MED ORDER — BETAMETHASONE SOD PHOS & ACET 6 (3-3) MG/ML IJ SUSP
12.0000 mg | Freq: Once | INTRAMUSCULAR | Status: AC
  Administered 2013-08-13: 12 mg via INTRAMUSCULAR
  Filled 2013-08-13: qty 2

## 2013-08-13 MED ORDER — NIFEDIPINE ER 30 MG PO TB24
30.0000 mg | ORAL_TABLET | Freq: Two times a day (BID) | ORAL | Status: DC
  Administered 2013-08-14 – 2013-08-17 (×7): 30 mg via ORAL
  Filled 2013-08-13 (×9): qty 1

## 2013-08-13 MED ORDER — TERBUTALINE SULFATE 1 MG/ML IJ SOLN
0.2500 mg | Freq: Once | INTRAMUSCULAR | Status: AC
  Administered 2013-08-13: 0.25 mg via SUBCUTANEOUS
  Filled 2013-08-13: qty 1

## 2013-08-13 NOTE — Progress Notes (Signed)
Called to room.  Pt. Informed me that she had wiped her perineum after voiding and noted a large amount of mucus on the tissue.  Wanted to know if it were her mucus plug.  Large amt. Mucus noted on tissue.  No bleeding at site or on tissue.  Pt. Remains on fetal monitor.

## 2013-08-13 NOTE — Progress Notes (Signed)
Rhonda Adams is a 37 y.o. [phone] at [redacted]w[redacted]d  admitted for Preterm labor  Subjective: Pt started feeling stronger contractions 30 min ago. +FM. No LOF, VB.   Objective: BP 121/69  Pulse 84  Temp(Src) 98.6 F (37 C) (Oral)  Resp 20  Ht 5\' 4"  (1.626 m)  Wt 99.791 kg (220 lb)  BMI 37.74 kg/m2  LMP 12/22/2012 I/O last 3 completed shifts: In: 1131.7 [P.O.:720; I.V.:411.7] Out: 800 [Urine:800] Total I/O In: 1755 [P.O.:630; I.V.:1125] Out: 1875 [Urine:1875]  FHT:  FHR: 120 bpm, variability: moderate,  accelerations:  Present,  decelerations:  Present variables UC:   regular, every 5 minutes for last 40 min SVE:   Dilation: 6 Effacement (%): 100 Station: 0 Exam by:: Dr. Reola Adams  Labs: Lab Results  Component Value Date   WBC 9.6 08/04/2013   HGB 11.9* 08/04/2013   HCT 36.2 08/04/2013   MCV 86.2 08/04/2013   PLT 192 08/04/2013    Assessment / Plan: r/O PTL  PTL: cervix unchanged despite contractions. Will cont to monitor closely. Cont mag.  FWB: cat II tracing. BMZ #2 today at 1430 Cont to monitor closely.    Rhonda Adams L 08/13/2013, 6:46 AM

## 2013-08-13 NOTE — Progress Notes (Addendum)
Transferred to 155 room. Report given to Jaclynn Guarneri, RN.

## 2013-08-13 NOTE — Progress Notes (Signed)
Patient ID: Rhonda Adams, female   DOB: [birth year], 37 y.o.   MRN: [id number]  Called into pt room as both pt and husband were concerned about her possibly progressing. States she is not really feeling contractions. Has good FM. No LOF or VB.    FHT 125, mod var, +accels SVE: 6/100/0 VTX   A/p Unchanged from last check.  Reassurance given Cont mag for tocolysis Second BMZ tomorrow

## 2013-08-14 NOTE — Progress Notes (Signed)
Patient ID: Rhonda Adams, female   DOB: 1976/09/27, 37 y.o.   MRN: [id number] Rhonda Adams is a 37 y.o. [id number] at [redacted]w[redacted]d  admitted for preterm labor  Subjective: Pt reports feeling well. She denies any change in her contraction patterns, reporting contractions q 20-30 minutes. +FM. No LOF, VB.   Objective: BP 113/55  Pulse 80  Temp(Src) 98.3 F (36.8 C) (Oral)  Resp 18  Ht 5\' 4"  (1.626 m)  Wt 220 lb (99.791 kg)  BMI 37.74 kg/m2  LMP 12/22/2012 I/O last 3 completed shifts: In: 6780 [P.O.:2730; I.V.:4050] Out: 7375 [Urine:7375]    FHT:  FHR: 130 bpm, variability: moderate,  accelerations:  Present,  decelerations:  Absent UC:   Irregular q 15-30 minutes  Labs: Lab Results  Component Value Date   WBC 9.6 08/04/2013   HGB 11.9* 08/04/2013   HCT 36.2 08/04/2013   MCV 86.2 08/04/2013   PLT 192 08/04/2013    Assessment / Plan: 37 yo [id number] with preterm labor s/p second course of betamethasone - Continue tocolysis with procardia - Continue current care and close monitoring   Rhonda Adams 08/14/2013, 7:09 AM

## 2013-08-15 MED ORDER — SODIUM CHLORIDE 0.9 % IJ SOLN
3.0000 mL | Freq: Two times a day (BID) | INTRAMUSCULAR | Status: DC
  Administered 2013-08-15 – 2013-08-16 (×4): 3 mL via INTRAVENOUS

## 2013-08-15 NOTE — Progress Notes (Signed)
Patient ID: Rhonda Adams, female   DOB: August 31, 1976, 37 y.o.   MRN: [id number] Rhonda Adams is a 37 y.o. [phone] at [redacted]w[redacted]d  admitted for preterm labor  Subjective: Pt reports feeling well. She denies feeling any contractions. +FM. No LOF, VB.   Objective: BP 117/67  Pulse 71  Temp(Src) 98.6 F (37 C) (Oral)  Resp 18  Ht 5\' 4"  (1.626 m)  Wt 220 lb (99.791 kg)  BMI 37.74 kg/m2  LMP 12/22/2012 I/O last 3 completed shifts: In: 34 [P.O.:2980; I.V.:1300] Out: 8350 [Urine:8350]    FHT:  FHR: 130 bpm, variability: moderate,  accelerations:  Present,  decelerations:  Absent UC:   Irregular q 3-15 minutes  Labs: Lab Results  Component Value Date   WBC 9.6 08/04/2013   HGB 11.9* 08/04/2013   HCT 36.2 08/04/2013   MCV 86.2 08/04/2013   PLT 192 08/04/2013    Assessment / Plan: 37 yo [id number] with preterm labor at [redacted]w[redacted]d s/p second course of betamethasone - Continue tocolysis with procardia - Continue current care and close monitoring   Rhonda Adams 08/15/2013, 8:32 AM

## 2013-08-16 LAB — CBC
MCHC: 33.1 g/dL (ref 30.0–36.0)
Platelets: 194 10*3/uL (ref 150–400)
RDW: 14 % (ref 11.5–15.5)

## 2013-08-16 LAB — TYPE AND SCREEN
ABO/RH(D): O POS
Antibody Screen: NEGATIVE

## 2013-08-16 NOTE — Progress Notes (Signed)
UR completed 

## 2013-08-16 NOTE — Progress Notes (Signed)
08/16/13 1300  Clinical Encounter Type  Visited With Patient  Visit Type Initial;Spiritual support;Social support   Rhonda Adams is relaxed and in good spirits, looking forward to the arrival of her baby with very little anxiety.  She presents as prepared and equipped to cope with a preterm delivery and possible NICU stay.  Her husband, per pt, is the more anxious one.  She has encouraged him to get some sleep at home and to focus on work while he has the chance.  Provided pastoral presence, affirmation, and intro to spiritual care and chaplain availability.    [address] [place], [place] [phone]

## 2013-08-16 NOTE — Progress Notes (Signed)
Patient ID: KASHARI CHALMERS, female   DOB: 1976-04-06, 37 y.o.   MRN: [id number] IMARA STANDIFORD is a 36 y.o. [id number] at [redacted]w[redacted]d  admitted for preterm labor  Subjective: Pt reports feeling well. She denies feeling any contractions. +FM. No LOF, VB.   Objective: BP 109/48  Pulse 98  Temp(Src) 97.7 F (36.5 C) (Oral)  Resp 18  Ht 5\' 4"  (1.626 m)  Wt 220 lb (99.791 kg)  BMI 37.74 kg/m2  LMP 12/22/2012 I/O last 3 completed shifts: In: 2080 [P.O.:2080] Out: 5650 [Urine:5650]    FHT:  FHR: 130 bpm, variability: moderate,  accelerations:  Present,  decelerations:  Absent UC:   Occasional contractions  Labs: Lab Results  Component Value Date   WBC 9.6 08/04/2013   HGB 11.9* 08/04/2013   HCT 36.2 08/04/2013   MCV 86.2 08/04/2013   PLT 192 08/04/2013    Assessment / Plan: 37 yo [id number] with preterm labor at [redacted]w[redacted]d s/p second course of betamethasone - Continue tocolysis with procardia - Continue current care and close monitoring   Argelio Granier 08/16/2013, 6:58 AM

## 2013-08-17 ENCOUNTER — Encounter: Payer: BC Managed Care – PPO | Admitting: Obstetrics & Gynecology

## 2013-08-17 DIAGNOSIS — O4700 False labor before 37 completed weeks of gestation, unspecified trimester: Principal | ICD-10-CM

## 2013-08-17 MED ORDER — NIFEDIPINE ER 60 MG PO TB24: 60.0000 mg | ORAL_TABLET | Freq: Two times a day (BID) | ORAL | Status: DC

## 2013-08-17 NOTE — Progress Notes (Signed)
Patient ID: Rhonda Adams, female   DOB: 1976-09-17, 37 y.o.   MRN: [id number] FACULTY PRACTICE ANTEPARTUM(COMPREHENSIVE) NOTE  Rhonda Adams is a 37 y.o. [id number] at [redacted]w[redacted]d by LMP, early ultrasound who is admitted for Preterm labor.   Fetal presentation is cephalic. Length of Stay:  5  Days  Subjective: Still feeling some contractions but not as bad as earlier.  On procardia. Patient reports the fetal movement as active. Patient reports uterine contraction  activity as regular, every 2-3 minutes. Patient reports  vaginal bleeding as none. Patient describes fluid per vagina as None.  Vitals:  Blood pressure 114/70, pulse 88, temperature 98.3 F (36.8 C), temperature source Oral, resp. rate 20, height 5\' 4"  (1.626 m), weight 220 lb (99.791 kg), last menstrual period 12/22/2012. Physical Examination:  General appearance - alert, well appearing, and in no distress Abdomen - soft, nontender, nondistended, no masses or organomegaly gravid Fundal Height:  size equals dates Cervical Exam: Position: anterior, Dilation: 5cm, Thickness: paper thin and Consistency: soft and found to be 0 station and fetal presentation is cephalic. Extremities: extremities normal, atraumatic, no cyanosis or edema  Membranes:intact  Fetal Monitoring:  Baseline: 135 bpm, Variability: Good {> 6 bpm), Accelerations: Reactive and Decelerations: Absent  Medications:  Scheduled . docusate sodium  100 mg Oral Daily  . NIFEdipine  30 mg Oral BID  . prenatal multivitamin  1 tablet Oral Q1200  . sodium chloride  3 mL Intravenous Q12H   I have reviewed the patient's current medications.  ASSESSMENT: Preterm Labor  PLAN: Increase activity--if no cervical change consider discharge.  Rhonda Adams S 08/17/2013,11:16 AM

## 2013-08-17 NOTE — Discharge Instructions (Signed)

## 2013-08-17 NOTE — Discharge Summary (Signed)
Antenatal Physician Discharge Summary  Patient ID: CHANESE HARTSOUGH MRN: [id number] DOB/AGE: Mar 31, 1976 37 y.o.  Admit date: 08/12/2013 Discharge date: 08/17/2013  Admission Diagnoses:  Patient Active Problem List   Diagnosis Date Noted  . AMA (advanced maternal age) multigravida 35+ 07/23/2012    Priority: High  . Marginal posterior placenta previa without hemorrhage, antepartum 06/10/2013    Priority: Medium  . Short cervix, antepartum 06/10/2013    Priority: Medium  . MTHFR homozygous C677T mutation complicating pregnancy 03/17/2013    Priority: Medium  . Elevated blood pressure complicating pregnancy, antepartum 03/17/2013    Priority: Medium  . Previous preterm delivery, antepartum 03/17/2013    Priority: Medium  . Preterm labor without delivery in third trimester 08/17/2013  . Pregnancy complicated by previous recurrent miscarriages 08/11/2012  . Obesity in pregnancy 07/23/2012  . Ovarian cyst   . Migraine      Discharge Diagnoses: Same  Prenatal Procedures: Tocolysis, Betamethasone  Significant Diagnostic Studies:  Results for orders placed during the hospital encounter of 08/12/13 (from the past 168 hour(s))  TYPE AND SCREEN   Collection Time    08/12/13  7:20 PM      Result Value Range   ABO/RH(D) O POS     Antibody Screen NEG     Sample Expiration 08/15/2013    CBC   Collection Time    08/16/13  8:00 AM      Result Value Range   WBC 12.3 (*) 4.0 - 10.5 K/uL   RBC 4.38  3.87 - 5.11 MIL/uL   Hemoglobin 12.4  12.0 - 15.0 g/dL   HCT 86.5  78.4 - 69.6 %   MCV 85.6  78.0 - 100.0 fL   MCH 28.3  26.0 - 34.0 pg   MCHC 33.1  30.0 - 36.0 g/dL   RDW 29.5  28.4 - 13.2 %   Platelets 194  150 - 400 K/uL  TYPE AND SCREEN   Collection Time    08/16/13  8:00 AM      Result Value Range   ABO/RH(D) O POS     Antibody Screen NEG     Sample Expiration 08/19/2013      Treatments: IV hydration, steroids: Betamethasone and Magnesium Tocolysis  Hospital Course:  This  is a 37 y.o. [id number] with IUP at [redacted]w[redacted]d admitted for Preterm labor. She was admitted with contractions, noted to have a cervical exam of 6 cm.  No leaking of fluid and no bleeding.  She was initially started on magnesium sulfate for tocolysis and neuroprotection and also received betamethasone x 2 doses.  Her tocolysis was transitioned to Procardia.  She continued Promtrium. She was observed, allowed to increase activity, fetal heart rate monitoring remained reassuring, and she had no signs/symptoms of progressing preterm labor or other maternal-fetal concerns.  Her cervical exam was unchanged from admission.  She was deemed stable for discharge to home with outpatient follow up.  Discharge Exam: BP 114/70  Pulse 88  Temp(Src) 98.3 F (36.8 C) (Oral)  Resp 20  Ht 5\' 4"  (1.626 m)  Wt 220 lb (99.791 kg)  BMI 37.74 kg/m2  LMP 12/22/2012 General appearance: alert, cooperative and appears stated age GI: soft, non-tender; bowel sounds normal; no masses,  no organomegaly gravid  Discharge Condition: Stable  Disposition: 01-Home or Self Care  Discharge Orders   Future Orders Complete By Expires   Discharge activity:  Bathroom / Shower only  As directed    Discharge activity:  Up to eat  As directed    Discharge activity: Bedrest  As directed    Discharge diet:  No restrictions  As directed    Do not have sex or do anything that might make you have an orgasm  As directed    Fetal Kick Count:  Lie on our left side for one hour after a meal, and count the number of times your baby kicks.  If it is less than 5 times, get up, move around and drink some juice.  Repeat the test 30 minutes later.  If it is still less than 5 kicks in an hour, notify your doctor.  As directed    Notify physician for leaking of fluid  As directed    Notify physician for vaginal bleeding  As directed        Medication List         calcium carbonate 500 MG chewable tablet  Commonly known as:  TUMS - dosed in mg  elemental calcium  Chew 2 tablets by mouth daily as needed for heartburn.     folic acid 1 MG tablet  Commonly known as:  FOLVITE  TAKE 4 TABLETS BY MOUTH EVERY DAY     multivitamin with minerals tablet  Take 1 tablet by mouth daily.     NIFEdipine 60 MG 24 hr tablet  Commonly known as:  PROCARDIA-XL/ADALAT CC  Take 1 tablet (60 mg total) by mouth 2 (two) times daily.     progesterone 200 MG capsule  Commonly known as:  PROMETRIUM  Place 200 mg vaginally at bedtime.     pyridOXINE 100 MG tablet  Commonly known as:  VITAMIN B-6  Take 50 mg by mouth daily.           Follow-up Information   Follow up with [facility] In 5 days. (Will have Darl Pikes call to schedule.)    Specialty:  Obstetrics and Gynecology   Contact information:   [address] [place] [address] [phone]      Signed: Reva Bores M.D. 08/17/2013, 4:50 PM

## 2013-08-21 ENCOUNTER — Other Ambulatory Visit: Payer: Self-pay | Admitting: Obstetrics & Gynecology

## 2013-08-22 ENCOUNTER — Ambulatory Visit (INDEPENDENT_AMBULATORY_CARE_PROVIDER_SITE_OTHER): Payer: BC Managed Care – PPO | Admitting: Family Medicine

## 2013-08-22 DIAGNOSIS — Z3480 Encounter for supervision of other normal pregnancy, unspecified trimester: Secondary | ICD-10-CM

## 2013-08-22 DIAGNOSIS — O4700 False labor before 37 completed weeks of gestation, unspecified trimester: Secondary | ICD-10-CM

## 2013-08-22 NOTE — Patient Instructions (Signed)
Pregnancy - Third Trimester The third trimester of pregnancy (the last 3 months) is a period of the most rapid growth for you and your baby. The baby approaches a length of 20 inches and a weight of 6 to 10 pounds. The baby is adding on fat and getting ready for life outside your body. While inside, babies have periods of sleeping and waking, sucking thumbs, and hiccuping. You can often feel small contractions of the uterus. This is false labor. It is also called Braxton-Hicks contractions. This is like a practice for labor. The usual problems in this stage of pregnancy include more difficulty breathing, swelling of the hands and feet from water retention, and having to urinate more often because of the uterus and baby pressing on your bladder.  PRENATAL EXAMS  Blood work may continue to be done during prenatal exams. These tests are done to check on your health and the probable health of your baby. Blood work is used to follow your blood levels (hemoglobin). Anemia (low hemoglobin) is common during pregnancy. Iron and vitamins are given to help prevent this. You may also continue to be checked for diabetes. Some of the past blood tests may be done again.  The size of the uterus is measured during each visit. This makes sure your baby is growing properly according to your pregnancy dates.  Your blood pressure is checked every prenatal visit. This is to make sure you are not getting toxemia.  Your urine is checked every prenatal visit for infection, diabetes, and protein.  Your weight is checked at each visit. This is done to make sure gains are happening at the suggested rate and that you and your baby are growing normally.  Sometimes, an ultrasound is performed to confirm the position and the proper growth and development of the baby. This is a test done that bounces harmless sound waves off the baby so your caregiver can more accurately determine a due date.  Discuss the type of pain medicine and  anesthesia you will have during your labor and delivery.  Discuss the possibility and anesthesia if a cesarean section might be necessary.  Inform your caregiver if there is any mental or physical violence at home. Sometimes, a specialized non-stress test, contraction stress test, and biophysical profile are done to make sure the baby is not having a problem. Checking the amniotic fluid surrounding the baby is called an amniocentesis. The amniotic fluid is removed by sticking a needle into the belly (abdomen). This is sometimes done near the end of pregnancy if an early delivery is required. In this case, it is done to help make sure the baby's lungs are mature enough for the baby to live outside of the womb. If the lungs are not mature and it is unsafe to deliver the baby, an injection of cortisone medicine is given to the mother 1 to 2 days before the delivery. This helps the baby's lungs mature and makes it safer to deliver the baby. CHANGES OCCURING IN THE THIRD TRIMESTER OF PREGNANCY Your body goes through many changes during pregnancy. They vary from person to person. Talk to your caregiver about changes you notice and are concerned about.  During the last trimester, you have probably had an increase in your appetite. It is normal to have cravings for certain foods. This varies from person to person and pregnancy to pregnancy.  You may begin to get stretch marks on your hips, abdomen, and breasts. These are normal changes in the body   during pregnancy. There are no exercises or medicines to take which prevent this change.  Constipation may be treated with a stool softener or adding bulk to your diet. Drinking lots of fluids, fiber in vegetables, fruits, and whole grains are helpful.  Exercising is also helpful. If you have been very active up until your pregnancy, most of these activities can be continued during your pregnancy. If you have been less active, it is helpful to start an exercise  program such as walking. Consult your caregiver before starting exercise programs.  Avoid all smoking, alcohol, non-prescribed drugs, herbs and "street drugs" during your pregnancy. These chemicals affect the formation and growth of the baby. Avoid chemicals throughout the pregnancy to ensure the delivery of a healthy infant.  Backache, varicose veins, and hemorrhoids may develop or get worse.  You will tire more easily in the third trimester, which is normal.  The baby's movements may be stronger and more often.  You may become short of breath easily.  Your belly button may stick out.  A yellow discharge may leak from your breasts called colostrum.  You may have a bloody mucus discharge. This usually occurs a few days to a week before labor begins. HOME CARE INSTRUCTIONS   Keep your caregiver's appointments. Follow your caregiver's instructions regarding medicine use, exercise, and diet.  During pregnancy, you are providing food for you and your baby. Continue to eat regular, well-balanced meals. Choose foods such as meat, fish, milk and other low fat dairy products, vegetables, fruits, and whole-grain breads and cereals. Your caregiver will tell you of the ideal weight gain.  A physical sexual relationship may be continued throughout pregnancy if there are no other problems such as early (premature) leaking of amniotic fluid from the membranes, vaginal bleeding, or belly (abdominal) pain.  Exercise regularly if there are no restrictions. Check with your caregiver if you are unsure of the safety of your exercises. Greater weight gain will occur in the last 2 trimesters of pregnancy. Exercising helps:  Control your weight.  Get you in shape for labor and delivery.  You lose weight after you deliver.  Rest a lot with legs elevated, or as needed for leg cramps or low back pain.  Wear a good support or jogging bra for breast tenderness during pregnancy. This may help if worn during  sleep. Pads or tissues may be used in the bra if you are leaking colostrum.  Do not use hot tubs, steam rooms, or saunas.  Wear your seat belt when driving. This protects you and your baby if you are in an accident.  Avoid raw meat, cat litter boxes and soil used by cats. These carry germs that can cause birth defects in the baby.  It is easier to leak urine during pregnancy. Tightening up and strengthening the pelvic muscles will help with this problem. You can practice stopping your urination while you are going to the bathroom. These are the same muscles you need to strengthen. It is also the muscles you would use if you were trying to stop from passing gas. You can practice tightening these muscles up 10 times a set and repeating this about 3 times per day. Once you know what muscles to tighten up, do not perform these exercises during urination. It is more likely to cause an infection by backing up the urine.  Ask for help if you have financial, counseling, or nutritional needs during pregnancy. Your caregiver will be able to offer counseling for these   needs as well as refer you for other special needs.  Make a list of emergency phone numbers and have them available.  Plan on getting help from family or friends when you go home from the hospital.  Make a trial run to the hospital.  Take prenatal classes with the father to understand, practice, and ask questions about the labor and delivery.  Prepare the baby's room or nursery.  Do not travel out of the city unless it is absolutely necessary and with the advice of your caregiver.  Wear only low or no heal shoes to have better balance and prevent falling. MEDICINES AND DRUG USE IN PREGNANCY  Take prenatal vitamins as directed. The vitamin should contain 1 milligram of folic acid. Keep all vitamins out of reach of children. Only a couple vitamins or tablets containing iron may be fatal to a baby or young child when ingested.  Avoid use  of all medicines, including herbs, over-the-counter medicines, not prescribed or suggested by your caregiver. Only take over-the-counter or prescription medicines for pain, discomfort, or fever as directed by your caregiver. Do not use aspirin, ibuprofen or naproxen unless approved by your caregiver.  Let your caregiver also know about herbs you may be using.  Alcohol is related to a number of birth defects. This includes fetal alcohol syndrome. All alcohol, in any form, should be avoided completely. Smoking will cause low birth rate and premature babies.  Illegal drugs are very harmful to the baby. They are absolutely forbidden. A baby born to an addicted mother will be addicted at birth. The baby will go through the same withdrawal an adult does. SEEK MEDICAL CARE IF: You have any concerns or worries during your pregnancy. It is better to call with your questions if you feel they cannot wait, rather than worry about them. SEEK IMMEDIATE MEDICAL CARE IF:   An unexplained oral temperature above 102 F (38.9 C) develops, or as your caregiver suggests.  You have leaking of fluid from the vagina. If leaking membranes are suspected, take your temperature and tell your caregiver of this when you call.  There is vaginal spotting, bleeding or passing clots. Tell your caregiver of the amount and how many pads are used.  You develop a bad smelling vaginal discharge with a change in the color from clear to white.  You develop vomiting that lasts more than 24 hours.  You develop chills or fever.  You develop shortness of breath.  You develop burning on urination.  You loose more than 2 pounds of weight or gain more than 2 pounds of weight or as suggested by your caregiver.  You notice sudden swelling of your face, hands, and feet or legs.  You develop belly (abdominal) pain. Round ligament discomfort is a common non-cancerous (benign) cause of abdominal pain in pregnancy. Your caregiver still  must evaluate you.  You develop a severe headache that does not go away.  You develop visual problems, blurred or double vision.  If you have not felt your baby move for more than 1 hour. If you think the baby is not moving as much as usual, eat something with sugar in it and lie down on your left side for an hour. The baby should move at least 4 to 5 times per hour. Call right away if your baby moves less than that.  You fall, are in a car accident, or any kind of trauma.  There is mental or physical violence at home. Document Released: 11/25/2001   Document Revised: 08/25/2012 Document Reviewed: 05/30/2009 [employer] Patient Information 2014 [employer].  Breastfeeding A change in hormones during your pregnancy causes growth of your breast tissue and an increase in number and size of milk ducts. The hormone prolactin allows proteins, sugars, and fats from your blood supply to make breast milk in your milk-producing glands. The hormone progesterone prevents breast milk from being released before the birth of your baby. After the birth of your baby, your progesterone level decreases allowing breast milk to be released. Thoughts of your baby, as well as his or her sucking or crying, can stimulate the release of milk from the milk-producing glands. Deciding to breastfeed (nurse) is one of the best choices you can make for you and your baby. The information that follows gives a brief review of the benefits, as well as other important skills to know about breastfeeding. BENEFITS OF BREASTFEEDING For your baby  The first milk (colostrum) helps your baby's digestive system function better.   There are antibodies in your milk that help your baby fight off infections.   Your baby has a lower incidence of asthma, allergies, and sudden infant death syndrome (SIDS).   The nutrients in breast milk are better for your baby than infant formulas.  Breast milk improves your baby's brain development.    Your baby will have less gas, colic, and constipation.  Your baby is less likely to develop other conditions, such as childhood obesity, asthma, or diabetes mellitus. For you  Breastfeeding helps develop a very special bond between you and your baby.   Breastfeeding is convenient, always available at the correct temperature, and costs nothing.   Breastfeeding helps to burn calories and helps you lose the weight gained during pregnancy.   Breastfeeding makes your uterus contract back down to normal size faster and slows bleeding following delivery.   Breastfeeding mothers have a lower risk of developing osteoporosis or breast or ovarian cancer later in life.  BREASTFEEDING FREQUENCY  A healthy, full-term baby may breastfeed as often as every hour or space his or her feedings to every 3 hours. Breastfeeding frequency will vary from baby to baby.   Newborns should be fed no less than every 2 3 hours during the day and every 4 5 hours during the night. You should breastfeed a minimum of 8 feedings in a 24 hour period.  Awaken your baby to breastfeed if it has been 3 4 hours since the last feeding.  Breastfeed when you feel the need to reduce the fullness of your breasts or when your newborn shows signs of hunger. Signs that your baby may be hungry include:  Increased alertness or activity.  Stretching.  Movement of the head from side to side.  Movement of the head and opening of the mouth when the corner of the mouth or cheek is stroked (rooting).  Increased sucking sounds, smacking lips, cooing, sighing, or squeaking.  Hand-to-mouth movements.  Increased sucking of fingers or hands.  Fussing.  Intermittent crying.  Signs of extreme hunger will require calming and consoling before you try to feed your baby. Signs of extreme hunger may include:  Restlessness.  A loud, strong cry.  Screaming.  Frequent feeding will help you make more milk and will help prevent  problems, such as sore nipples and engorgement of the breasts.  BREASTFEEDING   Whether lying down or sitting, be sure that the baby's abdomen is facing your abdomen.   Support your breast with 4 fingers under your breast   and your thumb above your nipple. Make sure your fingers are well away from your nipple and your baby's mouth.   Stroke your baby's lips gently with your finger or nipple.   When your baby's mouth is open wide enough, place all of your nipple and as much of the colored area around your nipple (areola) as possible into your baby's mouth.  More areola should be visible above his or her upper lip than below his or her lower lip.  Your baby's tongue should be between his or her lower gum and your breast.  Ensure that your baby's mouth is correctly positioned around the nipple (latched). Your baby's lips should create a seal on your breast.  Signs that your baby has effectively latched onto your nipple include:  Tugging or sucking without pain.  Swallowing heard between sucks.  Absent click or smacking sound.  Muscle movement above and in front of his or her ears with sucking.  Your baby must suck about 2 3 minutes in order to get your milk. Allow your baby to feed on each breast as long as he or she wants. Nurse your baby until he or she unlatches or falls asleep at the first breast, then offer the second breast.  Signs that your baby is full and satisfied include:  A gradual decrease in the number of sucks or complete cessation of sucking.  Falling asleep.  Extension or relaxation of his or her body.  Retention of a small amount of milk in his or her mouth.  Letting go of your breast by himself or herself.  Signs of effective breastfeeding in you include:  Breasts that have increased firmness, weight, and size prior to feeding.  Breasts that are softer after nursing.  Increased milk volume, as well as a change in milk consistency and color by the 5th  day of breastfeeding.  Breast fullness relieved by breastfeeding.  Nipples are not sore, cracked, or bleeding.  If needed, break the suction by putting your finger into the corner of your baby's mouth and sliding your finger between his or her gums. Then, remove your breast from his or her mouth.  It is common for babies to spit up a small amount after a feeding.  Babies often swallow air during feeding. This can make babies fussy. Burping your baby between breasts can help with this.  Vitamin D supplements are recommended for babies who get only breast milk.  Avoid using a pacifier during your baby's first 4 6 weeks.  Avoid supplemental feedings of water, formula, or juice in place of breastfeeding. Breast milk is all the food your baby needs. It is not necessary for your baby to have water or formula. Your breasts will make more milk if supplemental feedings are avoided during the early weeks. HOW TO TELL WHETHER YOUR BABY IS GETTING ENOUGH BREAST MILK Wondering whether or not your baby is getting enough milk is a common concern among mothers. You can be assured that your baby is getting enough milk if:   Your baby is actively sucking and you hear swallowing.   Your baby seems relaxed and satisfied after a feeding.   Your baby nurses at least 8 12 times in a 24 hour time period.  During the first 3 5 days of age:  Your baby is wetting at least 3 5 diapers in a 24 hour period. The urine should be clear and pale yellow.  Your baby is having at least 3 4 stools in   a 24 hour period. The stool should be soft and yellow.  At 5 7 days of age, your baby is having at least 3 6 stools in a 24 hour period. The stool should be seedy and yellow by 5 days of age.  Your baby has a weight loss less than 7 10% during the first 3 days of age.  Your baby does not lose weight after 3 7 days of age.  Your baby gains 4 7 ounces each week after he or she is 4 days of age.  Your baby gains weight  by 5 days of age and is back to birth weight within 2 weeks. ENGORGEMENT In the first week after your baby is born, you may experience extremely full breasts (engorgement). When engorged, your breasts may feel heavy, warm, or tender to the touch. Engorgement peaks within 24 48 hours after delivery of your baby.  Engorgement may be reduced by:  Continuing to breastfeed.  Increasing the frequency of breastfeeding.  Taking warm showers or applying warm, moist heat to your breasts just before each feeding. This increases circulation and helps the milk flow.   Gently massaging your breast before and during the feedings. With your fingertips, massage from your chest wall towards your nipple in a circular motion.   Ensuring that your baby empties at least one breast at every feeding. It also helps to start the next feeding on the opposite breast.   Expressing breast milk by hand or by using a breast pump to empty the breasts if your baby is sleepy, or not nursing well. You may also want to express milk if you are returning to work oryou feel you are getting engorged.  Ensuring your baby is latched on and positioned properly while breastfeeding. If you follow these suggestions, your engorgement should improve in 24 48 hours. If you are still experiencing difficulty, call your lactation consultant or caregiver.  CARING FOR YOURSELF Take care of your breasts.  Bathe or shower daily.   Avoid using soap on your nipples.   Wear a supportive bra. Avoid wearing underwire style bras.  Air dry your nipples for a 3 4minutes after each feeding.   Use only cotton bra pads to absorb breast milk leakage. Leaking of breast milk between feedings is normal.   Use only pure lanolin on your nipples after nursing. You do not need to wash it off before feeding your baby again. Another option is to express a few drops of breast milk and gently massage that milk into your nipples.  Continue breast  self-awareness checks. Take care of yourself.  Eat healthy foods. Alternate 3 meals with 3 snacks.  Avoid foods that you notice affect your baby in a bad way.  Drink milk, fruit juice, and water to satisfy your thirst (about 8 glasses a day).   Rest often, relax, and take your prenatal vitamins to prevent fatigue, stress, and anemia.  Avoid chewing and smoking tobacco.  Avoid alcohol and drug use.  Take over-the-counter and prescribed medicine only as directed by your caregiver or pharmacist. You should always check with your caregiver or pharmacist before taking any new medicine, vitamin, or herbal supplement.  Know that pregnancy is possible while breastfeeding. If desired, talk to your caregiver about family planning and safe birth control methods that may be used while breastfeeding. SEEK MEDICAL CARE IF:   You feel like you want to stop breastfeeding or have become frustrated with breastfeeding.  You have painful breasts or nipples.    Your nipples are cracked or bleeding.  Your breasts are red, tender, or warm.  You have a swollen area on either breast.  You have a fever or chills.  You have nausea or vomiting.  You have drainage from your nipples.  Your breasts do not become full before feedings by the 5th day after delivery.  You feel sad and depressed.  Your baby is too sleepy to eat well.  Your baby is having trouble sleeping.   Your baby is wetting less than 3 diapers in a 24 hour period.  Your baby has less than 3 stools in a 24 hour period.  Your baby's skin or the white part of his or her eyes becomes more yellow.   Your baby is not gaining weight by 5 days of age. MAKE SURE YOU:   Understand these instructions.  Will watch your condition.  Will get help right away if you are not doing well or get worse. Document Released: 12/01/2005 Document Revised: 08/25/2012 Document Reviewed: 07/07/2012 [employer] Patient Information 2014 [employer].  

## 2013-08-22 NOTE — Progress Notes (Signed)
PTL stable--advised of what should bring her in.

## 2013-08-22 NOTE — Assessment & Plan Note (Signed)
Continue current treatment plan. 

## 2013-08-22 NOTE — Progress Notes (Signed)
[id number]  Patient is having increased cramping today and feeling increased pressure.

## 2013-08-29 ENCOUNTER — Encounter: Payer: Self-pay | Admitting: Obstetrics & Gynecology

## 2013-08-29 ENCOUNTER — Ambulatory Visit (INDEPENDENT_AMBULATORY_CARE_PROVIDER_SITE_OTHER): Payer: BC Managed Care – PPO | Admitting: Obstetrics & Gynecology

## 2013-08-29 DIAGNOSIS — Z3480 Encounter for supervision of other normal pregnancy, unspecified trimester: Secondary | ICD-10-CM

## 2013-08-29 DIAGNOSIS — O09523 Supervision of elderly multigravida, third trimester: Secondary | ICD-10-CM

## 2013-08-29 DIAGNOSIS — O4700 False labor before 37 completed weeks of gestation, unspecified trimester: Secondary | ICD-10-CM

## 2013-08-29 DIAGNOSIS — Z23 Encounter for immunization: Secondary | ICD-10-CM

## 2013-08-29 DIAGNOSIS — O09529 Supervision of elderly multigravida, unspecified trimester: Secondary | ICD-10-CM

## 2013-08-29 IMAGING — US US OB TRANSVAGINAL
1 series · 13 of 20 positions shown · non-contrast
Comparison: none

[Series 1: us ob transvaginal · 0.28mm/px · 13 of 20 slices shown]
[im 1/20]
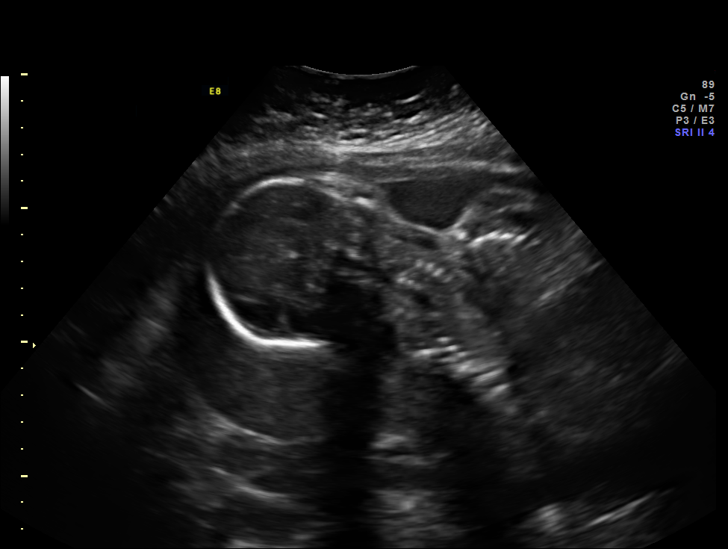
[im 3/20]
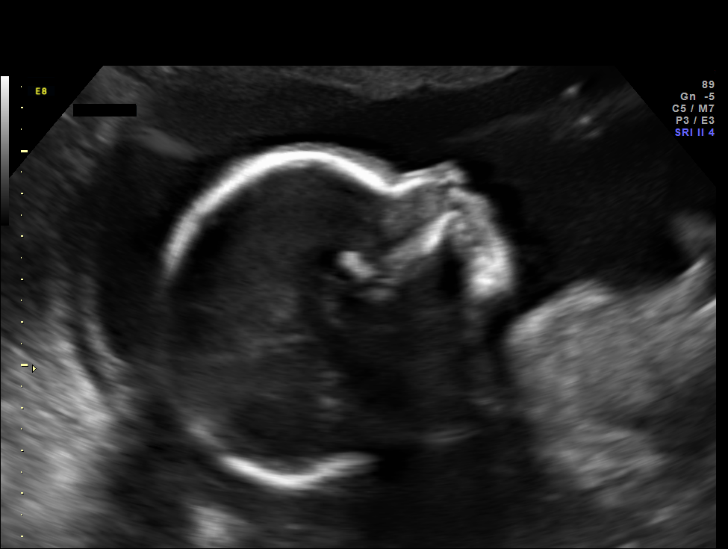
[im 4/20]
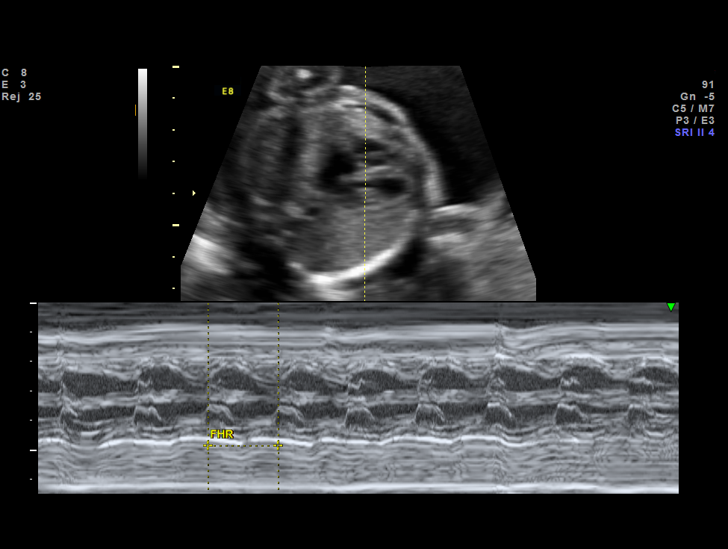
[im 6/20]
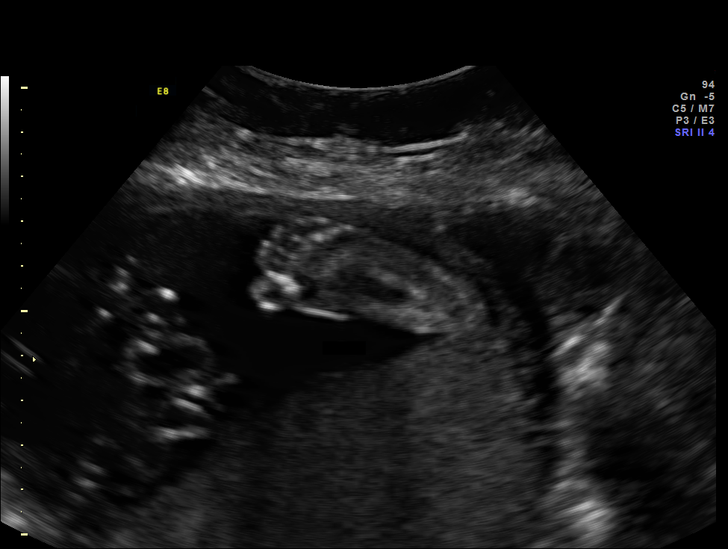
[im 7/20]
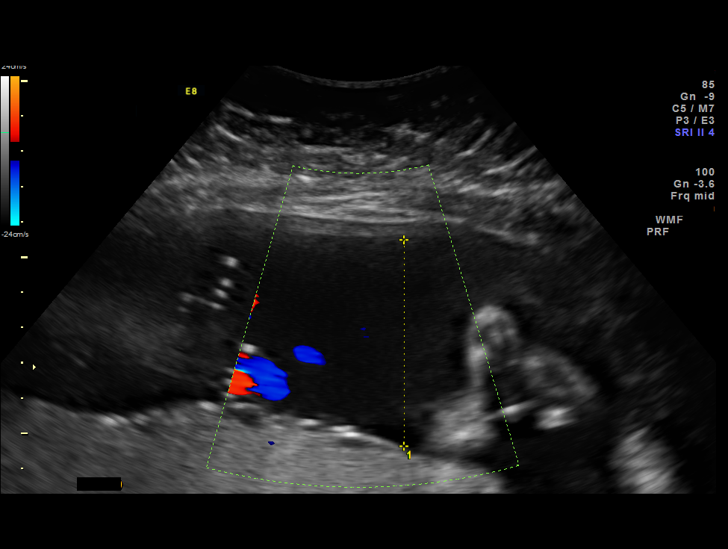
[im 9/20]
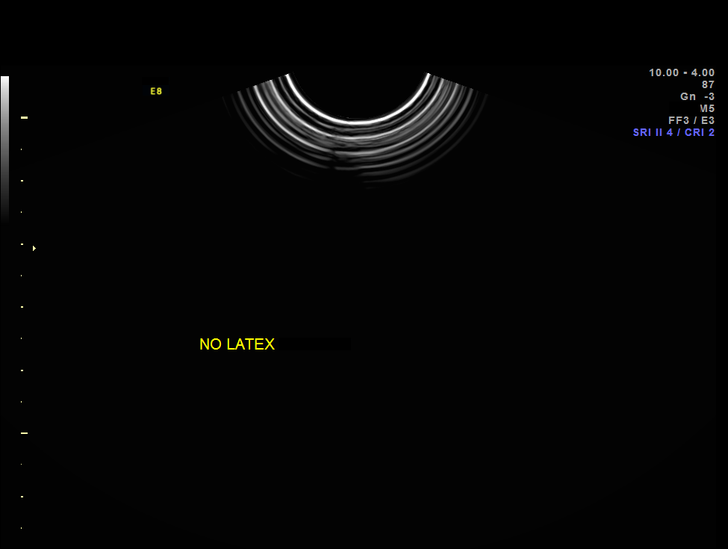
[im 11/20]
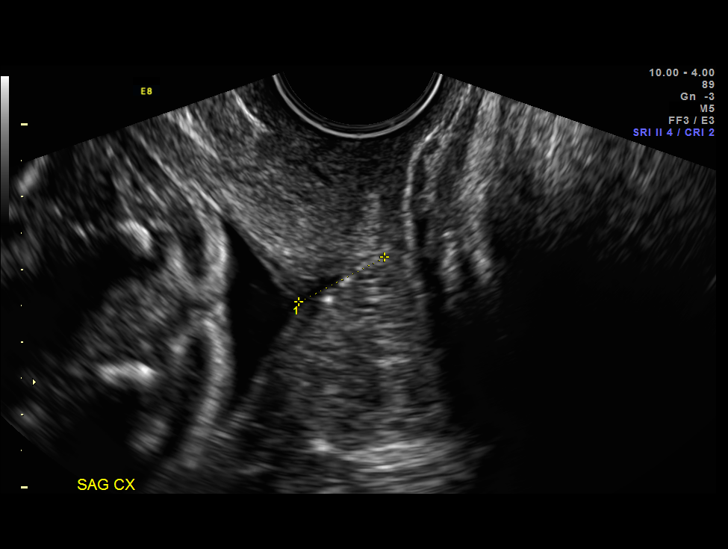
[im 12/20]
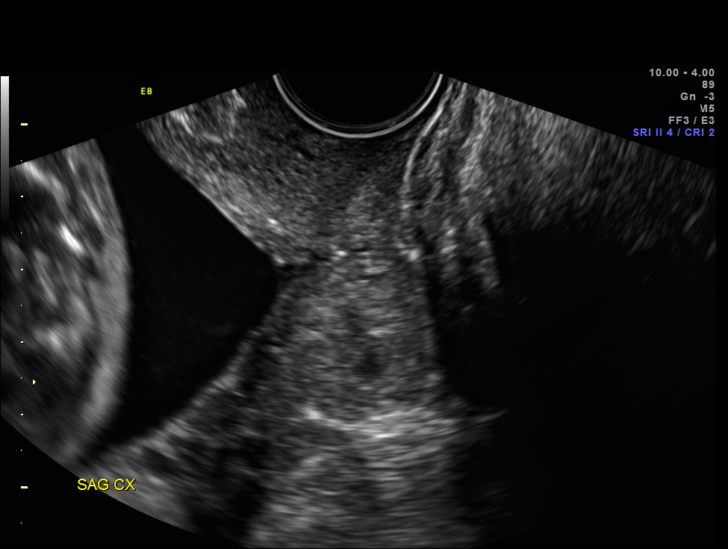
[im 14/20]
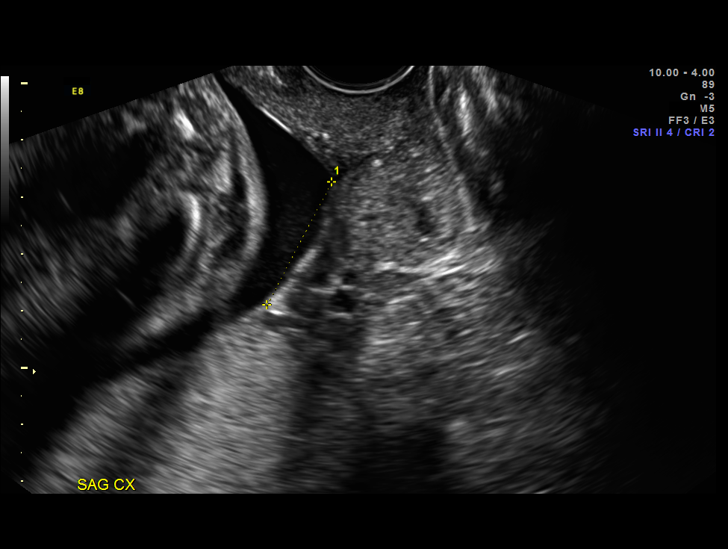
[im 15/20]
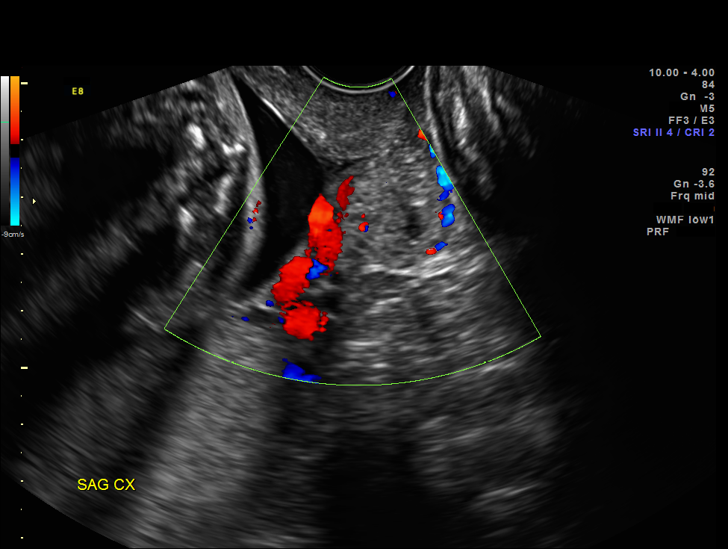
[im 17/20]
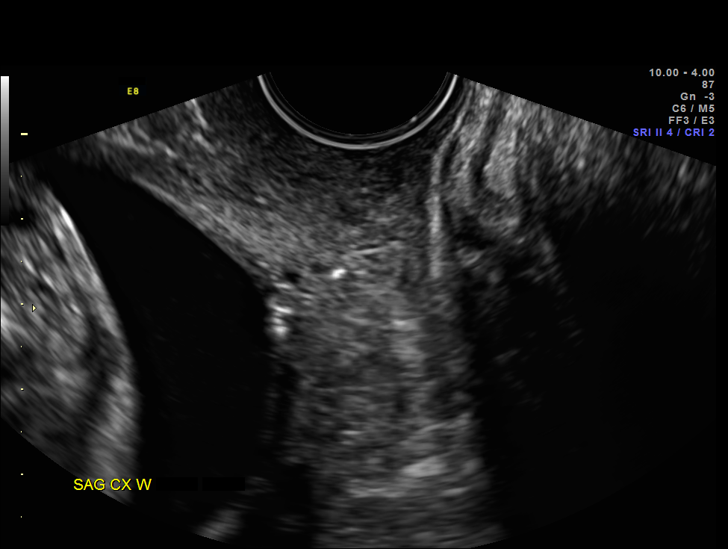
[im 18/20]
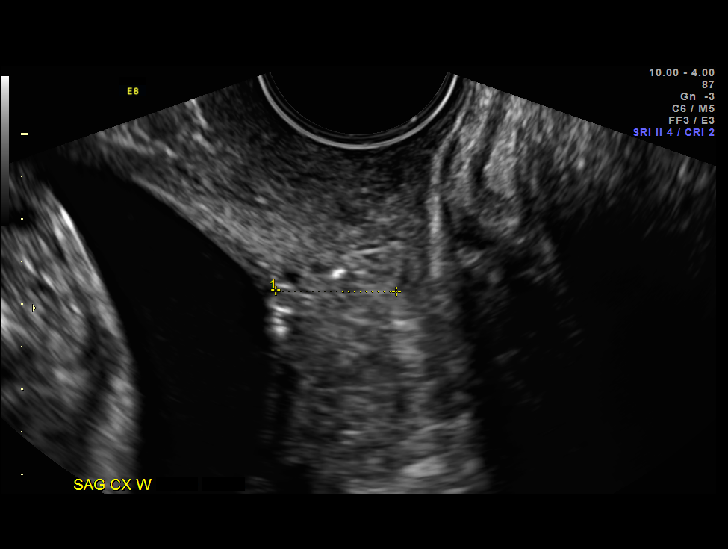
[im 20/20]
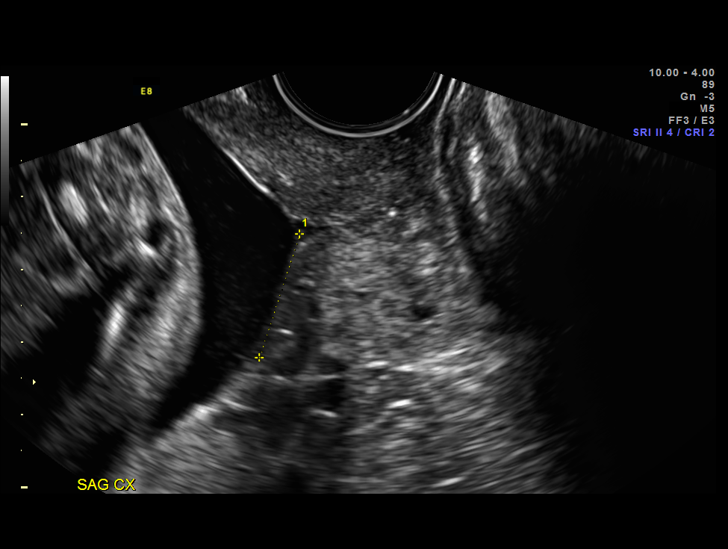

[13 of 20 positions shown; findings below may reference images not displayed]

OBSTETRICS REPORT
                      (Signed Final 06/15/2013 [DATE])

Service(s) Provided

 US OB TRANSVAGINAL                                    76817.0
Indications

 Cervical shortening
 Poor obstetric history: Previous preterm delivery
 (36 weeks)
 Advanced maternal age (AMA), Multigravida -
 declined screening
 Choroid plexus cyst
 MTHFR trait (homozygous)
 Poor obstetric history-Recurrent (habitual) abortion
 (SAB x4)
 Placenta previa/Low lying: No bleeding
Fetal Evaluation

 Num Of Fetuses:    1
 Cardiac Activity:  Observed
 Presentation:      Breech
 Placenta:          Posterior, low-lying,
                    cm from int os
 P. Cord            Previously Visualized
 Insertion:

 Amniotic Fluid
 AFI FV:      Subjectively within normal limits
                                             Larg Pckt:     5.8  cm
Gestational Age

 LMP:           25w 0d        Date:  12/22/12                 EDD:   09/28/13
 Best:          25w 0d     Det. By:  LMP  (12/22/12)          EDD:   09/28/13
Cervix Uterus Adnexa

 Cervical Length:    1.3      cm

 Cervix:       Measured transvaginally.
Comments

 Ms. Idelfonso is being followed for a shortened cervix with a prior
 history of a 36 weeks delivery.  Today the best shortest
 measurement of her cervix is 1.3 cm which is slightly less
 than her last measurement of 1.7 cm last week.  Although
 this may represent the normal variability of cervical length
 measurement, it may also represent further effacement of the
 cervix.  As such I have recommended a course of
 Yazmen as a precaution.  The first dose was
 administered in the [facility] today.  She will return tomorrow
 afternoon for her second shot.  Ms. Idelfonso in planning a long
 trip next [REDACTED].  She will return for a repeat cervical
 length next week to reassess for further shortened before
 going away.
Impression

 Single living intrauterine pregnancy at 25 weeks 0 days.
 Cervical shortening.
Recommendations

 Recommend follow-up ultrasound examination in one week
 (see also commments).

 questions or concerns.
                Bibas, Bareket

## 2013-08-29 NOTE — Progress Notes (Signed)
Routine visit. Good FM. No change in her level of contractions, possibly less CTX with the increase of Procardia to 60 mg. She will get the flu vaccine today. She said that Dr. Shawnie Pons told her to continue the procardia until 36 weeks.  Strict PTL precautions given.

## 2013-08-29 NOTE — Progress Notes (Signed)
P-[id number] 

## 2013-08-31 ENCOUNTER — Encounter (HOSPITAL_COMMUNITY): Payer: Self-pay

## 2013-08-31 ENCOUNTER — Inpatient Hospital Stay (HOSPITAL_COMMUNITY)
Admission: AD | Admit: 2013-08-31 | Discharge: 2013-09-01 | Disposition: A | Discharge status: Home / Self Care | Payer: BC Managed Care – PPO | Source: Ambulatory Visit | Attending: Obstetrics and Gynecology | Admitting: Obstetrics and Gynecology

## 2013-08-31 DIAGNOSIS — O2623 Pregnancy care for patient with recurrent pregnancy loss, third trimester: Secondary | ICD-10-CM

## 2013-08-31 DIAGNOSIS — O4403 Placenta previa specified as without hemorrhage, third trimester: Secondary | ICD-10-CM

## 2013-08-31 DIAGNOSIS — O26873 Cervical shortening, third trimester: Secondary | ICD-10-CM

## 2013-08-31 DIAGNOSIS — O09213 Supervision of pregnancy with history of pre-term labor, third trimester: Secondary | ICD-10-CM

## 2013-08-31 DIAGNOSIS — O4700 False labor before 37 completed weeks of gestation, unspecified trimester: Secondary | ICD-10-CM | POA: Insufficient documentation

## 2013-08-31 DIAGNOSIS — E7212 Methylenetetrahydrofolate reductase deficiency: Secondary | ICD-10-CM

## 2013-08-31 DIAGNOSIS — O163 Unspecified maternal hypertension, third trimester: Secondary | ICD-10-CM

## 2013-08-31 NOTE — MAU Note (Signed)
Pt states she has been having uc's all day. Was told in clinic to return if uc's continue. SVE in the clinic was 6. Denies LOF, bleeding but does have a mucous brown discharge.

## 2013-08-31 NOTE — MAU Note (Signed)
Stopped procardia last pm, started contracting tonight, was 6 cm last week.

## 2013-09-01 DIAGNOSIS — O4700 False labor before 37 completed weeks of gestation, unspecified trimester: Secondary | ICD-10-CM

## 2013-09-01 NOTE — MAU Provider Note (Signed)
History     CSN: [id number]  Arrival date and time: 08/31/13 2304   First Provider Initiated Contact with Patient 09/01/13 0141      Chief Complaint  Patient presents with  . Labor Eval   HPI Ms Rhonda Adams is a 11BJ [phone] at 36.1wks who presents for eval of ctx. Denies leak or bldg. Reports +FM. No s/s preeclampsia. Her cx has been 6cm dilated x 2 weeks and she was taking Procardia until recently. She is seen at the [facility] office. Pt and spouse very concerned re delivering outside of hospital due to hx of labor where she did not feel painful ctx.  OB History   Grav Para Term Preterm Abortions TAB SAB Ect Mult Living   6 1 0 1 4 0 4 0 0 1       Past Medical History  Diagnosis Date  . Ovarian cyst   . Migraine   . Preterm labor 2012    Past Surgical History  Procedure Laterality Date  . Dilation and curettage of uterus    . Miscarriage    . Arthroscopic repair acl      left    Family History  Problem Relation Age of Onset  . Cancer Mother 25    BREAST  . Hypertension Father   . Heart disease Maternal Grandmother   . Diabetes Maternal Grandfather   . Stroke Paternal Grandfather   . Hearing loss Neg Hx     History  Substance Use Topics  . Smoking status: Never Smoker   . Smokeless tobacco: Never Used  . Alcohol Use: Yes     Comment: rare, not with preg    Allergies:  Allergies  Allergen Reactions  . Tomasa Blase Flavor Nausea And Vomiting    Pt states she is allergic to bacon; not bacon flavor.    Prescriptions prior to admission  Medication Sig Dispense Refill  . calcium carbonate (TUMS - DOSED IN MG ELEMENTAL CALCIUM) 500 MG chewable tablet Chew 2 tablets by mouth daily as needed for heartburn.       . folic acid (FOLVITE) 1 MG tablet TAKE 4 TABLETS BY MOUTH EVERY DAY  120 tablet  2  . Multiple Vitamins-Minerals (MULTIVITAMIN WITH MINERALS) tablet Take 1 tablet by mouth daily.      Marland Kitchen NIFEdipine (PROCARDIA-XL/ADALAT CC) 60 MG 24 hr tablet Take 1 tablet (60  mg total) by mouth 2 (two) times daily.  60 tablet  1  . pyridOXINE (VITAMIN B-6) 100 MG tablet Take 50 mg by mouth daily.         ROS Physical Exam   Blood pressure 122/71, pulse 86, temperature 98.6 F (37 C), resp. rate 20, last menstrual period 12/22/2012, SpO2 100.00%.  Physical Exam  Constitutional: She is oriented to person, place, and time. She appears well-developed.  HENT:  Head: Normocephalic.  Cardiovascular: Normal rate.   Respiratory: Effort normal.  GI:  EFM 130s +accels, no decels Ctx irreg q 4-8 mins  Genitourinary:  Cx 5-6/70/-1 (unchanged after multiple exams)  Neurological: She is alert and oriented to person, place, and time.  Skin: Skin is warm and dry.  Psychiatric: She has a normal mood and affect. Her behavior is normal. Thought content normal.    MAU Course  Procedures    Assessment and Plan  IUP at 36.1wks False labor  D/C home after prolonged observation for labor Had extensive discussion re s/s to look for indicating active labor F/U at next OB appt as scheduled unless  labors before then  Cam Hai 09/01/2013, 3:23 AM

## 2013-09-01 NOTE — MAU Provider Note (Signed)
Attestation of Attending Supervision of Advanced Practitioner: Evaluation and management procedures were performed by the PA/NP/CNM/OB Fellow under my supervision/collaboration. Chart reviewed and agree with management and plan.  Kelleen Stolze V 09/01/2013 11:28 PM

## 2013-09-01 NOTE — Progress Notes (Signed)
Explained in detail s/s of labor, pt has multiple concerns about knowing when to return to the hospital for labor.  Patient very teary and upset. Will notify Philipp Deputy per pt request to see the Midwife.

## 2013-09-03 ENCOUNTER — Encounter (HOSPITAL_COMMUNITY): Payer: Self-pay | Admitting: *Deleted

## 2013-09-03 ENCOUNTER — Inpatient Hospital Stay (HOSPITAL_COMMUNITY)
Admission: AD | Admit: 2013-09-03 | Discharge: 2013-09-07 | DRG: 372 | Disposition: A | Discharge status: Home / Self Care | Payer: BC Managed Care – PPO | Source: Ambulatory Visit | Attending: Obstetrics & Gynecology | Admitting: Obstetrics & Gynecology

## 2013-09-03 DIAGNOSIS — O26873 Cervical shortening, third trimester: Secondary | ICD-10-CM

## 2013-09-03 DIAGNOSIS — O4403 Placenta previa specified as without hemorrhage, third trimester: Secondary | ICD-10-CM

## 2013-09-03 DIAGNOSIS — O26879 Cervical shortening, unspecified trimester: Secondary | ICD-10-CM | POA: Diagnosis present

## 2013-09-03 DIAGNOSIS — D65 Disseminated intravascular coagulation [defibrination syndrome]: Secondary | ICD-10-CM

## 2013-09-03 DIAGNOSIS — O09213 Supervision of pregnancy with history of pre-term labor, third trimester: Secondary | ICD-10-CM

## 2013-09-03 DIAGNOSIS — O8813 Amniotic fluid embolism in the puerperium: Secondary | ICD-10-CM | POA: Diagnosis not present

## 2013-09-03 DIAGNOSIS — IMO0001 Reserved for inherently not codable concepts without codable children: Secondary | ICD-10-CM

## 2013-09-03 DIAGNOSIS — Z862 Personal history of diseases of the blood and blood-forming organs and certain disorders involving the immune mechanism: Secondary | ICD-10-CM

## 2013-09-03 DIAGNOSIS — O09529 Supervision of elderly multigravida, unspecified trimester: Secondary | ICD-10-CM | POA: Diagnosis present

## 2013-09-03 DIAGNOSIS — O2622 Pregnancy care for patient with recurrent pregnancy loss, second trimester: Secondary | ICD-10-CM

## 2013-09-03 DIAGNOSIS — E7212 Methylenetetrahydrofolate reductase deficiency: Secondary | ICD-10-CM

## 2013-09-03 DIAGNOSIS — R040 Epistaxis: Secondary | ICD-10-CM | POA: Diagnosis present

## 2013-09-03 LAB — CBC
HCT: 36.6 % (ref 36.0–46.0)
Hemoglobin: 12.1 g/dL (ref 12.0–15.0)
MCHC: 33.1 g/dL (ref 30.0–36.0)
Platelets: 217 10*3/uL (ref 150–400)
RBC: 4.33 MIL/uL (ref 3.87–5.11)

## 2013-09-03 MED ORDER — LACTATED RINGERS IV SOLN
INTRAVENOUS | Status: DC
  Administered 2013-09-03 – 2013-09-04 (×2): via INTRAVENOUS

## 2013-09-03 NOTE — H&P (Signed)
Rhonda Adams is a 37 y.o. [phone] female at [redacted]w[redacted]d by LMP which correlates well w/ 19.3wk u/s, presenting for continuing uc's that have gotten closer and more intense since last mau visit on 9/18. Requesting epidural. Reports good fm, denies vb or lof.  She has been 6cm since at least 8/29, and changed to 7.5 on tonight's exam by RN.  She initiated pnc at [facility] at 12.1wks, declined genetic screening, initial marginal posterior previa and choriod plexus cysts that have both resolved, normal anatomy u/s, 1hr glucola 155 w/ normal 3hr: 89/169/131/95, gbs neg ~4wks ago.  H/O 36wk SVD d/t spont ptl in 2012 in which she says she never really felt uc's, declined 17P this pregnancy. This pregnancy complicated by vanishing twin, MTHFR hoomozygous C677T mutation- not requiring anticoagulation, short cervix at 24wks, received bmz, and was started on prometrium.   History OB History   Grav Para Term Preterm Abortions TAB SAB Ect Mult Living   6 1 0 1 4 0 4 0 0 1      Past Medical History  Diagnosis Date  . Ovarian cyst   . Migraine   . Preterm labor 2012   Past Surgical History  Procedure Laterality Date  . Dilation and curettage of uterus    . Miscarriage    . Arthroscopic repair acl      left   Family History: family history includes Cancer (age of onset: 25) in her mother; Diabetes in her maternal grandfather; Heart disease in her maternal grandmother; Hypertension in her father; Stroke in her paternal grandfather. There is no history of Hearing loss. Social History:  reports that she has never smoked. She has never used smokeless tobacco. She reports that  drinks alcohol. She reports that she does not use illicit drugs.   Review of Systems  Constitutional: Negative.   HENT: Negative.   Eyes: Negative.   Respiratory: Negative.   Cardiovascular: Negative.   Gastrointestinal: Positive for abdominal pain (uc's).  Genitourinary: Negative.   Musculoskeletal: Negative.   Skin: Negative.    Neurological: Negative.   Endo/Heme/Allergies: Negative.   Psychiatric/Behavioral: Negative.     Dilation: 7.5 Effacement (%): 80 Station: 0 Exam by:: K.WIlson<RN Blood pressure 122/78, pulse 100, temperature 98.1 F (36.7 C), resp. rate 20, height 5\' 5"  (1.651 m), weight 103.239 kg (227 lb 9.6 oz), last menstrual period 12/22/2012. Maternal Exam:  Uterine Assessment: Contraction strength is moderate.  Contraction frequency is regular.   Abdomen: Fetal presentation: vertex     Fetal Exam Fetal Monitor Review: Mode: ultrasound.   Baseline rate: 135.  Variability: moderate (6-25 bpm).   Pattern: accelerations present and no decelerations.    Fetal State Assessment: Category I - tracings are normal.     Physical Exam  Constitutional: She appears well-developed and well-nourished.  HENT:  Head: Normocephalic.  Neck: Normal range of motion.  Cardiovascular: Normal rate and regular rhythm.   Respiratory: Effort normal and breath sounds normal.  GI: Soft. There is no tenderness.  gravid  Genitourinary:  SVE by RN on arrival: 6/70, 1hr later 7.5/80/0, vtx    Prenatal labs: ABO, Rh: --/--/O POS (09/02 0800) Antibody: NEG (09/02 0800) Rubella: 0.81 (08/21 1610) RPR: NON REACTIVE (08/21 1610)  HBsAg: NEGATIVE (08/21 1610)  HIV: NON REACTIVE (07/22 0947)  GBS: Negative (08/21 0000)   Assessment/Plan: A:  [redacted]w[redacted]d SIUP  [id number]   Cat I FHR  GBS neg  Spontaneous PTL  AMA  MTHFR mutation  P:  Admit to BS  IV  pain meds/epidural prn  Expectant management  Anticipate NSVD     Marge Duncans 09/03/2013, 11:06 PM

## 2013-09-03 NOTE — MAU Note (Signed)
I have had PTL since 32 wks. And have been 6cms for 3 wks. Contractions closer and stronger today since lunch time. For past hour ctxs 2-43mins apart

## 2013-09-04 ENCOUNTER — Encounter (HOSPITAL_COMMUNITY)
Admission: AD | Disposition: A | Discharge status: Home / Self Care | Payer: Self-pay | Source: Ambulatory Visit | Attending: Obstetrics & Gynecology

## 2013-09-04 ENCOUNTER — Inpatient Hospital Stay (HOSPITAL_COMMUNITY): Payer: BC Managed Care – PPO | Admitting: Anesthesiology

## 2013-09-04 ENCOUNTER — Encounter (HOSPITAL_COMMUNITY): Payer: Self-pay | Admitting: Emergency Medicine

## 2013-09-04 ENCOUNTER — Encounter (HOSPITAL_COMMUNITY): Payer: Self-pay | Admitting: Anesthesiology

## 2013-09-04 DIAGNOSIS — O8813 Amniotic fluid embolism in the puerperium: Secondary | ICD-10-CM

## 2013-09-04 DIAGNOSIS — O26879 Cervical shortening, unspecified trimester: Secondary | ICD-10-CM

## 2013-09-04 HISTORY — PX: DILATION AND EVACUATION: SHX1459

## 2013-09-04 LAB — PREPARE RBC (CROSSMATCH)

## 2013-09-04 LAB — DIC (DISSEMINATED INTRAVASCULAR COAGULATION) PANEL
Platelets: 89 10*3/uL — ABNORMAL LOW (ref 150–400)
Smear Review: NONE SEEN

## 2013-09-04 LAB — BLOOD GAS, ARTERIAL

## 2013-09-04 LAB — CBC
HCT: 16.1 % — ABNORMAL LOW (ref 36.0–46.0)
Hemoglobin: 5.7 g/dL — CL (ref 12.0–15.0)
MCH: 29.3 pg (ref 26.0–34.0)
MCH: 29.4 pg (ref 26.0–34.0)
MCHC: 34.8 g/dL (ref 30.0–36.0)
MCHC: 35.4 g/dL (ref 30.0–36.0)
Platelets: 128 10*3/uL — ABNORMAL LOW (ref 150–400)
RBC: 1.94 MIL/uL — ABNORMAL LOW (ref 3.87–5.11)
RDW: 13.9 % (ref 11.5–15.5)
RDW: 14.2 % (ref 11.5–15.5)

## 2013-09-04 LAB — MASSIVE TRANSFUSION PROTOCOL ORDER (BLOOD BANK NOTIFICATION)

## 2013-09-04 LAB — DIC (DISSEMINATED INTRAVASCULAR COAGULATION)PANEL
D-Dimer, Quant: >20 ug/mL-FEU — ABNORMAL HIGH (ref 0.00–0.48)
Fibrinogen: <60 mg/dL — CL (ref 204–475)
Fibrinogen: 264 mg/dL (ref 204–475)
INR: 1.3 (ref 0.00–1.49)
Prothrombin Time: 23.1 seconds — ABNORMAL HIGH (ref 11.6–15.2)
Prothrombin Time: 14.3 seconds (ref 11.6–15.2)
Smear Review: NONE SEEN
aPTT: 29 seconds (ref 24–37)
aPTT: 27 seconds (ref 24–37)

## 2013-09-04 LAB — POSTPARTUM HEMORRHAGE PROTOCOL (BB NOTIFICATION)

## 2013-09-04 LAB — COMPREHENSIVE METABOLIC PANEL
ALT: 11 U/L (ref 0–35)
Albumin: 1.7 g/dL — ABNORMAL LOW (ref 3.5–5.2)
BUN: 12 mg/dL (ref 6–23)
Calcium: 7.9 mg/dL — ABNORMAL LOW (ref 8.4–10.5)
GFR calc non Af Amer: >90 mL/min (ref >90)
Potassium: 3.2 mEq/L — ABNORMAL LOW (ref 3.5–5.1)
Sodium: 137 mEq/L (ref 135–145)
Total Bilirubin: 0.8 mg/dL (ref 0.3–1.2)
Total Protein: 3.7 g/dL — ABNORMAL LOW (ref 6.0–8.3)

## 2013-09-04 LAB — RPR: RPR Ser Ql: NONREACTIVE

## 2013-09-04 SURGERY — DILATION AND EVACUATION, UTERUS
Anesthesia: Epidural | Site: Vagina | Wound class: Contaminated

## 2013-09-04 MED ORDER — LIDOCAINE-EPINEPHRINE (PF) 2 %-1:200000 IJ SOLN
INTRAMUSCULAR | Status: AC
  Filled 2013-09-04: qty 20

## 2013-09-04 MED ORDER — LACTATED RINGERS IV SOLN: 500.0000 mL | INTRAVENOUS | Status: DC | PRN

## 2013-09-04 MED ORDER — IBUPROFEN 600 MG PO TABS: 600.0000 mg | ORAL_TABLET | Freq: Four times a day (QID) | ORAL | Status: DC | PRN

## 2013-09-04 MED ORDER — IBUPROFEN 600 MG PO TABS: 600.0000 mg | ORAL_TABLET | Freq: Four times a day (QID) | ORAL | Status: DC

## 2013-09-04 MED ORDER — FENTANYL 2.5 MCG/ML BUPIVACAINE 1/10 % EPIDURAL INFUSION (WH - ANES)
INTRAMUSCULAR | Status: DC | PRN
  Administered 2013-09-04: 14 mL/h via EPIDURAL

## 2013-09-04 MED ORDER — OXYTOCIN 10 UNIT/ML IJ SOLN: 20.0000 [IU] | Freq: Once | INTRAMUSCULAR | Status: DC

## 2013-09-04 MED ORDER — DEXAMETHASONE SODIUM PHOSPHATE 10 MG/ML IJ SOLN
INTRAMUSCULAR | Status: AC
  Filled 2013-09-04: qty 1

## 2013-09-04 MED ORDER — DIPHENHYDRAMINE HCL 25 MG PO CAPS: 25.0000 mg | ORAL_CAPSULE | Freq: Four times a day (QID) | ORAL | Status: DC | PRN

## 2013-09-04 MED ORDER — SODIUM BICARBONATE 8.4 % IV SOLN
INTRAVENOUS | Status: DC | PRN
  Administered 2013-09-04: 5 mL via EPIDURAL

## 2013-09-04 MED ORDER — OXYTOCIN BOLUS FROM INFUSION
500.0000 mL | INTRAVENOUS | Status: DC
  Administered 2013-09-04: 500 mL via INTRAVENOUS

## 2013-09-04 MED ORDER — WITCH HAZEL-GLYCERIN EX PADS: 1.0000 "application " | MEDICATED_PAD | CUTANEOUS | Status: DC | PRN

## 2013-09-04 MED ORDER — BENZOCAINE-MENTHOL 20-0.5 % EX AERO
1.0000 "application " | INHALATION_SPRAY | CUTANEOUS | Status: DC | PRN
  Filled 2013-09-04: qty 56

## 2013-09-04 MED ORDER — DIBUCAINE 1 % RE OINT: 1.0000 "application " | TOPICAL_OINTMENT | RECTAL | Status: DC | PRN

## 2013-09-04 MED ORDER — OXYTOCIN 40 UNITS IN LACTATED RINGERS INFUSION - SIMPLE MED
62.5000 mL/h | INTRAVENOUS | Status: DC
  Administered 2013-09-04: 62.5 mL/h via INTRAVENOUS
  Filled 2013-09-04 (×2): qty 1000

## 2013-09-04 MED ORDER — PROMETHAZINE HCL 25 MG/ML IJ SOLN: 6.2500 mg | INTRAMUSCULAR | Status: DC | PRN

## 2013-09-04 MED ORDER — MEPERIDINE HCL 25 MG/ML IJ SOLN: 6.2500 mg | INTRAMUSCULAR | Status: DC | PRN

## 2013-09-04 MED ORDER — SENNOSIDES-DOCUSATE SODIUM 8.6-50 MG PO TABS
2.0000 | ORAL_TABLET | ORAL | Status: DC
  Administered 2013-09-05 – 2013-09-06 (×2): 2 via ORAL

## 2013-09-04 MED ORDER — MISOPROSTOL 200 MCG PO TABS
800.0000 ug | ORAL_TABLET | Freq: Once | ORAL | Status: AC
  Administered 2013-09-04: 800 ug via RECTAL

## 2013-09-04 MED ORDER — OXYTOCIN 40 UNITS IN LACTATED RINGERS INFUSION - SIMPLE MED
62.5000 mL/h | INTRAVENOUS | Status: AC
  Administered 2013-09-04: 62.5 mL/h via INTRAVENOUS
  Filled 2013-09-04: qty 1000

## 2013-09-04 MED ORDER — LIDOCAINE HCL (PF) 1 % IJ SOLN
30.0000 mL | INTRAMUSCULAR | Status: DC | PRN
  Filled 2013-09-04: qty 30

## 2013-09-04 MED ORDER — ZOLPIDEM TARTRATE 5 MG PO TABS: 5.0000 mg | ORAL_TABLET | Freq: Every evening | ORAL | Status: DC | PRN

## 2013-09-04 MED ORDER — SIMETHICONE 80 MG PO CHEW: 80.0000 mg | CHEWABLE_TABLET | ORAL | Status: DC | PRN

## 2013-09-04 MED ORDER — DIPHENHYDRAMINE HCL 50 MG/ML IJ SOLN: 12.5000 mg | INTRAMUSCULAR | Status: DC | PRN

## 2013-09-04 MED ORDER — EPHEDRINE 5 MG/ML INJ: 10.0000 mg | INTRAVENOUS | Status: DC | PRN

## 2013-09-04 MED ORDER — HYDROXYZINE HCL 50 MG PO TABS: 50.0000 mg | ORAL_TABLET | Freq: Four times a day (QID) | ORAL | Status: DC | PRN

## 2013-09-04 MED ORDER — METHYLERGONOVINE MALEATE 0.2 MG/ML IJ SOLN: 0.2000 mg | Freq: Once | INTRAMUSCULAR | Status: DC

## 2013-09-04 MED ORDER — LACTATED RINGERS IV SOLN: 500.0000 mL | Freq: Once | INTRAVENOUS | Status: DC

## 2013-09-04 MED ORDER — CEFAZOLIN SODIUM-DEXTROSE 2-3 GM-% IV SOLR
INTRAVENOUS | Status: DC | PRN
  Administered 2013-09-04: 2 g via INTRAVENOUS

## 2013-09-04 MED ORDER — PHENYLEPHRINE 40 MCG/ML (10ML) SYRINGE FOR IV PUSH (FOR BLOOD PRESSURE SUPPORT)
80.0000 ug | PREFILLED_SYRINGE | INTRAVENOUS | Status: DC | PRN
  Filled 2013-09-04: qty 5

## 2013-09-04 MED ORDER — CITRIC ACID-SODIUM CITRATE 334-500 MG/5ML PO SOLN: 30.0000 mL | ORAL | Status: DC | PRN

## 2013-09-04 MED ORDER — LIDOCAINE HCL (PF) 1 % IJ SOLN
INTRAMUSCULAR | Status: DC | PRN
  Administered 2013-09-04 (×2): 3 mL

## 2013-09-04 MED ORDER — OXYCODONE-ACETAMINOPHEN 5-325 MG PO TABS
1.0000 | ORAL_TABLET | ORAL | Status: DC | PRN
  Filled 2013-09-04: qty 2
  Filled 2013-09-04 (×2): qty 1
  Filled 2013-09-04 (×2): qty 2
  Filled 2013-09-04: qty 1

## 2013-09-04 MED ORDER — ACETAMINOPHEN 325 MG PO TABS: 650.0000 mg | ORAL_TABLET | ORAL | Status: DC | PRN

## 2013-09-04 MED ORDER — FENTANYL CITRATE 0.05 MG/ML IJ SOLN: 100.0000 ug | INTRAMUSCULAR | Status: DC | PRN

## 2013-09-04 MED ORDER — PRENATAL MULTIVITAMIN CH
1.0000 | ORAL_TABLET | Freq: Every day | ORAL | Status: DC
  Administered 2013-09-05 – 2013-09-07 (×3): 1 via ORAL
  Filled 2013-09-04 (×3): qty 1

## 2013-09-04 MED ORDER — FENTANYL 2.5 MCG/ML BUPIVACAINE 1/10 % EPIDURAL INFUSION (WH - ANES)
14.0000 mL/h | INTRAMUSCULAR | Status: DC | PRN
  Filled 2013-09-04: qty 125

## 2013-09-04 MED ORDER — PHENYLEPHRINE HCL 10 MG/ML IJ SOLN
10.0000 mg | INTRAVENOUS | Status: DC | PRN
  Administered 2013-09-04: 50 ug/min via INTRAVENOUS

## 2013-09-04 MED ORDER — METHYLERGONOVINE MALEATE 0.2 MG/ML IJ SOLN
INTRAMUSCULAR | Status: AC
  Administered 2013-09-04: 0.2 mg
  Filled 2013-09-04: qty 1

## 2013-09-04 MED ORDER — SODIUM CHLORIDE 0.9 % IV SOLN
INTRAVENOUS | Status: DC | PRN
  Administered 2013-09-04: 05:00:00 via INTRAVENOUS

## 2013-09-04 MED ORDER — SODIUM CHLORIDE 0.9 % IV SOLN
INTRAVENOUS | Status: DC
  Administered 2013-09-05: 04:00:00 via INTRAVENOUS

## 2013-09-04 MED ORDER — SODIUM CHLORIDE 0.9 % IJ SOLN
INTRAMUSCULAR | Status: AC
  Filled 2013-09-04: qty 6

## 2013-09-04 MED ORDER — LACTATED RINGERS IV SOLN
INTRAVENOUS | Status: DC | PRN
  Administered 2013-09-04: 05:00:00 via INTRAVENOUS

## 2013-09-04 MED ORDER — MISOPROSTOL 200 MCG PO TABS
200.0000 ug | ORAL_TABLET | Freq: Once | ORAL | Status: AC
  Administered 2013-09-04: 200 ug via VAGINAL

## 2013-09-04 MED ORDER — FLEET ENEMA 7-19 GM/118ML RE ENEM: 1.0000 | ENEMA | RECTAL | Status: DC | PRN

## 2013-09-04 MED ORDER — OXYTOCIN 10 UNIT/ML IJ SOLN
INTRAMUSCULAR | Status: AC
  Administered 2013-09-04: 20 [IU]
  Filled 2013-09-04: qty 2

## 2013-09-04 MED ORDER — CARBOPROST TROMETHAMINE 250 MCG/ML IM SOLN
INTRAMUSCULAR | Status: AC
  Administered 2013-09-04: 250 ug
  Filled 2013-09-04: qty 1

## 2013-09-04 MED ORDER — ONDANSETRON HCL 4 MG/2ML IJ SOLN
INTRAMUSCULAR | Status: AC
  Filled 2013-09-04: qty 2

## 2013-09-04 MED ORDER — PROPOFOL 10 MG/ML IV EMUL
INTRAVENOUS | Status: AC
  Filled 2013-09-04: qty 20

## 2013-09-04 MED ORDER — MISOPROSTOL 200 MCG PO TABS
ORAL_TABLET | ORAL | Status: AC
  Filled 2013-09-04: qty 5

## 2013-09-04 MED ORDER — ONDANSETRON HCL 4 MG/2ML IJ SOLN
INTRAMUSCULAR | Status: DC | PRN
  Administered 2013-09-04: 4 mg via INTRAVENOUS

## 2013-09-04 MED ORDER — SODIUM BICARBONATE 8.4 % IV SOLN
INTRAVENOUS | Status: AC
  Filled 2013-09-04: qty 50

## 2013-09-04 MED ORDER — ONDANSETRON HCL 4 MG/2ML IJ SOLN: 4.0000 mg | Freq: Four times a day (QID) | INTRAMUSCULAR | Status: DC | PRN

## 2013-09-04 MED ORDER — MIDAZOLAM HCL 5 MG/5ML IJ SOLN
INTRAMUSCULAR | Status: DC | PRN
  Administered 2013-09-04: 2 mg via INTRAVENOUS

## 2013-09-04 MED ORDER — HYDROMORPHONE HCL PF 1 MG/ML IJ SOLN: 0.2500 mg | INTRAMUSCULAR | Status: DC | PRN

## 2013-09-04 MED ORDER — EPHEDRINE 5 MG/ML INJ
10.0000 mg | INTRAVENOUS | Status: DC | PRN
  Filled 2013-09-04: qty 4

## 2013-09-04 MED ORDER — MIDAZOLAM HCL 2 MG/2ML IJ SOLN
INTRAMUSCULAR | Status: AC
  Filled 2013-09-04: qty 2

## 2013-09-04 MED ORDER — OXYCODONE-ACETAMINOPHEN 5-325 MG PO TABS
1.0000 | ORAL_TABLET | ORAL | Status: DC | PRN
  Administered 2013-09-04 – 2013-09-05 (×4): 1 via ORAL
  Administered 2013-09-05: 2 via ORAL
  Administered 2013-09-05: 1 via ORAL
  Administered 2013-09-06 (×3): 2 via ORAL
  Filled 2013-09-04: qty 2
  Filled 2013-09-04 (×2): qty 1

## 2013-09-04 MED ORDER — LANOLIN HYDROUS EX OINT: TOPICAL_OINTMENT | CUTANEOUS | Status: DC | PRN

## 2013-09-04 MED ORDER — TETANUS-DIPHTH-ACELL PERTUSSIS 5-2.5-18.5 LF-MCG/0.5 IM SUSP
0.5000 mL | Freq: Once | INTRAMUSCULAR | Status: AC
  Administered 2013-09-05: 0.5 mL via INTRAMUSCULAR
  Filled 2013-09-04: qty 0.5

## 2013-09-04 MED ORDER — DEXAMETHASONE SODIUM PHOSPHATE 4 MG/ML IJ SOLN
INTRAMUSCULAR | Status: DC | PRN
  Administered 2013-09-04: 10 mg via INTRAVENOUS

## 2013-09-04 MED ORDER — OXYCODONE-ACETAMINOPHEN 5-325 MG PO TABS
1.0000 | ORAL_TABLET | ORAL | Status: DC | PRN
Start: 2013-09-04 — End: 2013-09-04

## 2013-09-04 SURGICAL SUPPLY — 21 items
CATH ROBINSON RED A/P 16FR (CATHETERS) ×1 IMPLANT
CLOTH BEACON ORANGE TIMEOUT ST (SAFETY) ×2 IMPLANT
DECANTER SPIKE VIAL GLASS SM (MISCELLANEOUS) IMPLANT
GLOVE BIO SURGEON STRL SZ 6.5 (GLOVE) ×2 IMPLANT
GLOVE BIOGEL PI IND STRL 7.0 (GLOVE) ×1 IMPLANT
GLOVE BIOGEL PI INDICATOR 7.0 (GLOVE) ×1
GOWN STRL REIN XL XLG (GOWN DISPOSABLE) ×4 IMPLANT
KIT BERKELEY 1ST TRIMESTER 3/8 (MISCELLANEOUS) ×1 IMPLANT
NDL SPNL 22GX3.5 QUINCKE BK (NEEDLE) IMPLANT
NEEDLE SPNL 22GX3.5 QUINCKE BK (NEEDLE) IMPLANT
NS IRRIG 1000ML POUR BTL (IV SOLUTION) ×2 IMPLANT
PACK VAGINAL MINOR WOMEN LF (CUSTOM PROCEDURE TRAY) ×2 IMPLANT
PAD OB MATERNITY 4.3X12.25 (PERSONAL CARE ITEMS) ×2 IMPLANT
PAD PREP 24X48 CUFFED NSTRL (MISCELLANEOUS) ×2 IMPLANT
SET BERKELEY SUCTION TUBING (SUCTIONS) ×1 IMPLANT
SYR CONTROL 10ML LL (SYRINGE) IMPLANT
TOWEL OR 17X24 6PK STRL BLUE (TOWEL DISPOSABLE) ×4 IMPLANT
VACURETTE 10 RIGID CVD (CANNULA) IMPLANT
VACURETTE 7MM CVD STRL WRAP (CANNULA) IMPLANT
VACURETTE 8 RIGID CVD (CANNULA) IMPLANT
VACURETTE 9 RIGID CVD (CANNULA) IMPLANT

## 2013-09-04 NOTE — Progress Notes (Signed)
sitting

## 2013-09-04 NOTE — Progress Notes (Signed)
This note also relates to the following rows which could not be included: Arterial Line BP - Cannot attach notes to unvalidated device data   sitting

## 2013-09-04 NOTE — Progress Notes (Signed)
standing

## 2013-09-04 NOTE — Progress Notes (Signed)
Received pt from PACU s/p vaginal delivery with PPH-MTP initiated while in OR.

## 2013-09-04 NOTE — Progress Notes (Signed)
PCCM  Asked by ICU RN to assist in IV access earlier.  By the time we arrived, the pt had good functional IVs and had received 1/2 of her prescribed Blood products.    Pt /family resisting CVL placement and ICU RN now feels comfortable with access.  Plan to HOLD OFF on CVL placement at this time.  Call PCCM again prn at [phone] as needed for CVL access.  Dorcas Carrow Beeper  [phone]  Cell  [phone]  If no response or cell goes to voicemail, call beeper [phone]

## 2013-09-04 NOTE — Progress Notes (Signed)
Update given to Dr. Marice Potter. VSS. Scant vaginal bleeding. PRBC's infusing. Orders received. Will begin to remove saline from the Bakri Balloon by 50 cc at a time per MD.

## 2013-09-04 NOTE — Progress Notes (Signed)
Pt in OR .

## 2013-09-04 NOTE — Op Note (Signed)
Procedure: Exam under anesthesia and placement of Bakri balloon in postpartum uterine cavity. Preoperative diagnosis: Postpartum hemorrhage after vaginal delivery at 36 weeks 4 days Postoperative diagnosis: Same Surgeon: Dr. Scheryl Darter Anesthesia: Epidural and sedation Estimated blood loss: Estimated 2500 mL postpartum blood loss Specimens: None Complications: None Intraoperative procedures placement of arterial line Blood products given: Total 4 units of packed red blood cells, one unit of fresh frozen plasma Counts: Correct  Patient's postpartum from a vaginal delivery with postpartum hemorrhage. She received IV fluids, Pitocin, Cytotec, Methergine, and Hemabate. Prior to coming OR she received one unit of packed red blood cells and greater than 1 L of IV fluids. Bakri balloon had been placed but appeared to been displaced from the uterine cavity so we elected to come to the OR for exam under anesthesia replaced the Bakri balloon. The balloon was deflated and replaced in the uterine cavity and filled with 300 mL of saline. Her vaginal bleeding was much decreased at that point. The anesthesiologist placed an arterial line during the procedure. Vital signs were stable. She is brought in stable condition to PACU.  Dr. Scheryl Darter 09/04/2013 5:40 AM

## 2013-09-04 NOTE — Progress Notes (Signed)
First Unit PRBC's infusing.

## 2013-09-04 NOTE — Anesthesia Procedure Notes (Signed)
Epidural Patient location during procedure: OB Start time: 09/04/2013 12:45 AM End time: 09/04/2013 1:00 AM  Staffing Anesthesiologist: Lewie Loron R Performed by: anesthesiologist   Preanesthetic Checklist Completed: patient identified, pre-op evaluation, timeout performed, IV checked, risks and benefits discussed and monitors and equipment checked  Epidural Patient position: sitting Prep: DuraPrep Patient monitoring: heart rate, continuous pulse ox and blood pressure Injection technique: LOR air and LOR saline  Needle:  Needle type: Tuohy  Needle gauge: 17 G Needle length: 9 cm Needle insertion depth: 7 cm Catheter type: closed end flexible Catheter size: 19 Gauge Catheter at skin depth: 13 cm Test dose: negative  Assessment Sensory level: T8 Events: blood not aspirated, injection not painful, no injection resistance, negative IV test and no paresthesia  Additional Notes Reason for block:procedure for pain

## 2013-09-04 NOTE — Progress Notes (Signed)
See MAR for meds.

## 2013-09-04 NOTE — Progress Notes (Signed)
Pt lethargic. O2 Sat 80% on 10L via face mask. AC in room. Dr Renold Don called to room to assess pt.

## 2013-09-04 NOTE — Anesthesia Postprocedure Evaluation (Signed)
Anesthesia Post Note  Patient: Rhonda Adams  Procedure(s) Performed: Procedure(s) (LRB): exam under anesthesia placement Bakri balloon (N/A)  Anesthesia type: Epidural  Patient location: PACU  Post pain: Pain level controlled  Post assessment: Post-op Vital signs reviewed  Last Vitals: BP 101/56  Pulse 131  Temp(Src) 36.9 C (Oral)  Resp 24  Ht 5\' 5"  (1.651 m)  Wt 227 lb (102.967 kg)  BMI 37.77 kg/m2  SpO2 94%  LMP 12/22/2012  Post vital signs: Reviewed  Level of consciousness: sedated  PACU course: Continued resuscitation, but pt stabilizing. Hemoglobin > 8, but pt in DIC. Plan to focus on correcting coagulopathy. Continuing with cryo and FFP. Pt to be transported to ICU.  Complications: No apparent anesthesia complications

## 2013-09-04 NOTE — Progress Notes (Signed)
lying

## 2013-09-04 NOTE — Anesthesia Preprocedure Evaluation (Addendum)
Anesthesia Evaluation  Patient identified by MRN, date of birth, ID band Patient awake    Reviewed: Allergy & Precautions, H&P , NPO status , Patient's Chart, lab work & pertinent test results  Airway Mallampati: II TM Distance: >3 FB     Dental   Pulmonary neg pulmonary ROS,          Cardiovascular hypertension,     Neuro/Psych  Headaches, negative psych ROS   GI/Hepatic negative GI ROS, Neg liver ROS,   Endo/Other  negative endocrine ROS  Renal/GU negative Renal ROS     Musculoskeletal negative musculoskeletal ROS (+)   Abdominal   Peds  Hematology negative hematology ROS (+)   Anesthesia Other Findings   Reproductive/Obstetrics (+) Pregnancy                           Anesthesia Physical Anesthesia Plan  ASA: III and emergent  Anesthesia Plan: Epidural   Post-op Pain Management:    Induction:   Airway Management Planned:   Additional Equipment: Arterial line  Intra-op Plan:   Post-operative Plan:   Informed Consent: I have reviewed the patients History and Physical, chart, labs and discussed the procedure including the risks, benefits and alternatives for the proposed anesthesia with the patient or authorized representative who has indicated his/her understanding and acceptance.     Plan Discussed with:   Anesthesia Plan Comments:        Anesthesia Quick Evaluation

## 2013-09-04 NOTE — Progress Notes (Signed)
Results for Rhonda Adams, Rhonda Adams (MRN [id number]) as of 09/04/2013 18:32  Ref. Range 09/04/2013 15:55 09/04/2013 15:55  WBC Latest Range: 4.0-10.5 K/uL 13.5 (H)   RBC Latest Range: 3.87-5.11 MIL/uL 2.94 (L)   Hemoglobin Latest Range: 12.0-15.0 g/dL 8.6 (L)   HCT Latest Range: 36.0-46.0 % 23.6 (L)   MCV Latest Range: 78.0-100.0 fL 80.3   MCH Latest Range: 26.0-34.0 pg 29.3   MCHC Latest Range: 30.0-36.0 g/dL 40.9 (H)   RDW Latest Range: 11.5-15.5 % 13.9   Platelets Latest Range: 150-400 K/uL 89 (L) 89 (L)  Smear Review No range found  NO SCHISTOCYTES SEEN  D-Dimer, Quant Latest Range: 0.00-0.48 ug/mL-FEU  >20.00 (H)  Fibrinogen Latest Range: 204-475 mg/dL  811  Prothrombin Time Latest Range: 11.6-15.2 seconds  14.3  INR Latest Range: 0.00-1.49   1.13  APTT Latest Range: 24-37 seconds  27  Results called to Dr. Marice Potter. No new orders obtained.

## 2013-09-04 NOTE — Progress Notes (Signed)
Pt has sudden onset of lower, right sided abd pain. Pt writhing in bed and screaming.

## 2013-09-04 NOTE — Progress Notes (Signed)
PPH protocol initiated.

## 2013-09-04 NOTE — Anesthesia Postprocedure Evaluation (Signed)
  Anesthesia Post-op Note  Patient: Rhonda Adams  Procedure(s) Performed: Procedure(s): exam under anesthesia placement Bakri balloon (N/A)  Patient Location: ICU  Anesthesia Type:Epidural  Level of Consciousness: awake, alert  and oriented  Airway and Oxygen Therapy: Patient Spontanous Breathing  Post-op Pain: mild  Post-op Assessment: Patient's Cardiovascular Status Stable, Respiratory Function Stable, Patent Airway, No signs of Nausea or vomiting, Pain level controlled, No headache, No backache, No residual numbness and No residual motor weakness  Post-op Vital Signs: stable  Complications: No apparent anesthesia complications

## 2013-09-04 NOTE — Progress Notes (Signed)
Rhonda Adams is a 37 y.o. [phone] at [redacted]w[redacted]d  admitted for active preterm labor  Subjective: Comfortable w/ epidural, no complaints, husband supportive at bs  Objective: BP 114/73  Pulse 83  Temp(Src) 98 F (36.7 C) (Oral)  Resp 18  Ht 5\' 5"  (1.651 m)  Wt 102.967 kg (227 lb)  BMI 37.77 kg/m2  SpO2 96%  LMP 12/22/2012      FHT:  FHR: 125 bpm, variability: moderate,  accelerations:  Present,  decelerations:  Absent UC:   regular, every 2-6 minutes  SVE:   Dilation: 7.5 Effacement (%): 80 Station: 0 Exam by:: Joellyn Haff, CNM  Discussed pt w/ dr. Debroah Loop, preterm who made cervical change from 6 to 7.5cm, but now no change in 3hrs, dr. Debroah Loop states ok to arom to augment labor  AROM large amount clear fluid  Labs: Lab Results  Component Value Date   WBC 10.3 09/03/2013   HGB 12.1 09/03/2013   HCT 36.6 09/03/2013   MCV 84.5 09/03/2013   PLT 217 09/03/2013    Assessment / Plan: Advanced dilation in preterm gestation at 36.4wks, no cervical change in 3hrs, now augmented w/ arom  Labor: protracted active phase Preeclampsia:  n/a Fetal Wellbeing:  Category I Pain Control:  Epidural I/D:  n/a Anticipated MOD:  NSVD  Marge Duncans 09/04/2013, 2:56 AM

## 2013-09-04 NOTE — Progress Notes (Signed)
2205 - Pt. OOB ( no orthostatic changes at sitting or standing) brought pt. To NICU via wheelchair to see baby with husband. Pt. Tolerated well. Returned to 372 and assisted back to bed.

## 2013-09-04 NOTE — Transfer of Care (Signed)
Immediate Anesthesia Transfer of Care Note  Patient: Rhonda Adams  Procedure(s) Performed: Procedure(s): exam under anesthesia placement Bakri balloon (N/A)  Patient Location: PACU  Anesthesia Type:Epidural  Level of Consciousness: awake, alert  and oriented  Airway & Oxygen Therapy: Patient Spontanous Breathing  Post-op Assessment: Report given to PACU RN and Post -op Vital signs reviewed and stable  Post vital signs: Reviewed and stable  Complications: No apparent anesthesia complications

## 2013-09-04 NOTE — Progress Notes (Signed)
Massive Transfusion Protocol initiated.

## 2013-09-05 LAB — PREPARE FRESH FROZEN PLASMA
Unit division: 0
Unit division: 0

## 2013-09-05 LAB — CBC WITH DIFFERENTIAL/PLATELET
Basophils Absolute: 0 10*3/uL (ref 0.0–0.1)
HCT: 23.6 % — ABNORMAL LOW (ref 36.0–46.0)
Hemoglobin: 8.5 g/dL — ABNORMAL LOW (ref 12.0–15.0)
Lymphocytes Relative: 11 % — ABNORMAL LOW (ref 12–46)
Lymphs Abs: 1.5 10*3/uL (ref 0.7–4.0)
Monocytes Absolute: 1.2 10*3/uL — ABNORMAL HIGH (ref 0.1–1.0)
Monocytes Relative: 9 % (ref 3–12)
Neutro Abs: 10.8 10*3/uL — ABNORMAL HIGH (ref 1.7–7.7)
Platelets: 104 10*3/uL — ABNORMAL LOW (ref 150–400)
RBC: 2.89 MIL/uL — ABNORMAL LOW (ref 3.87–5.11)
WBC: 13.5 10*3/uL — ABNORMAL HIGH (ref 4.0–10.5)

## 2013-09-05 LAB — PREPARE CRYOPRECIPITATE: Unit division: 0

## 2013-09-05 LAB — CBC
Hemoglobin: 7.5 g/dL — ABNORMAL LOW (ref 12.0–15.0)
MCV: 80.2 fL (ref 78.0–100.0)
Platelets: 81 10*3/uL — ABNORMAL LOW (ref 150–400)
RBC: 2.63 MIL/uL — ABNORMAL LOW (ref 3.87–5.11)
WBC: 10.6 10*3/uL — ABNORMAL HIGH (ref 4.0–10.5)

## 2013-09-05 LAB — PREPARE PLATELET PHERESIS: Unit division: 0

## 2013-09-05 MED ORDER — SODIUM CHLORIDE 0.9 % IJ SOLN
3.0000 mL | Freq: Two times a day (BID) | INTRAMUSCULAR | Status: DC
  Administered 2013-09-05: 3 mL via INTRAVENOUS

## 2013-09-05 MED ORDER — SODIUM CHLORIDE 0.9 % IJ SOLN
3.0000 mL | INTRAMUSCULAR | Status: DC | PRN
  Administered 2013-09-05: 3 mL via INTRAVENOUS

## 2013-09-05 MED ORDER — SIMETHICONE 80 MG PO CHEW: 80.0000 mg | CHEWABLE_TABLET | ORAL | Status: DC | PRN

## 2013-09-05 MED ORDER — PRENATAL MULTIVITAMIN CH: 1.0000 | ORAL_TABLET | Freq: Every day | ORAL | Status: DC

## 2013-09-05 MED ORDER — ONDANSETRON HCL 4 MG/2ML IJ SOLN: 4.0000 mg | INTRAMUSCULAR | Status: DC | PRN

## 2013-09-05 MED ORDER — OXYCODONE-ACETAMINOPHEN 5-325 MG PO TABS: 1.0000 | ORAL_TABLET | ORAL | Status: DC | PRN

## 2013-09-05 MED ORDER — ONDANSETRON HCL 4 MG PO TABS: 4.0000 mg | ORAL_TABLET | ORAL | Status: DC | PRN

## 2013-09-05 MED ORDER — TETANUS-DIPHTH-ACELL PERTUSSIS 5-2.5-18.5 LF-MCG/0.5 IM SUSP: 0.5000 mL | Freq: Once | INTRAMUSCULAR | Status: DC

## 2013-09-05 MED ORDER — WITCH HAZEL-GLYCERIN EX PADS: 1.0000 "application " | MEDICATED_PAD | CUTANEOUS | Status: DC | PRN

## 2013-09-05 MED ORDER — DIBUCAINE 1 % RE OINT: 1.0000 "application " | TOPICAL_OINTMENT | RECTAL | Status: DC | PRN

## 2013-09-05 MED ORDER — SENNOSIDES-DOCUSATE SODIUM 8.6-50 MG PO TABS: 2.0000 | ORAL_TABLET | ORAL | Status: DC

## 2013-09-05 NOTE — Progress Notes (Signed)
Pt. Sleeping on right side.

## 2013-09-05 NOTE — Progress Notes (Signed)
Patient ID: Rhonda Adams, female   DOB: 01-15-1976, 37 y.o.   MRN: [id number]  S. She is doing well. Ambulating, eating, visiting her son in the NICU. She would like a circumcision, understands the risks.  O. VSS, AF      Heart-  rrr     Lungs- CTAB     Abd- benign  A/P. PPD #1 doing very well after DIC. She will be transferred to the floor as soon as her platelets will allow her arterial line removal.

## 2013-09-05 NOTE — Lactation Note (Signed)
This note was copied from the chart of Rhonda Adams. Lactation Consultation Note    Follow up consult with this mom of a NICU baby, 36 5/[redacted] weeks gestation, and now 35 hours post partum. Mom had fetal cells transfer to her sytem with birth, leading to anaphylaxis/DIC - she lost 3.5 L blood, and almost needed a hysterectomy. She is dong well now, after multiple blood and blood product transfusions.  The baby has RDS, on CPAP and some oxygen. Mom has very flat nipples, that invert with compression. She was not able to breast feed with her first child , due to this, but she pumped and bottle fed. This is mom's plan for this baby also, and she has a DEP at home. I started mom in the premie setting, and did teaching on providing EBm for a NICU baby. She was able to express a large drop of colostrum, which she and dad are bringing to the baby.  MOm already has pink, tender nipples. I advised her to apply EBM, and I gve her comfort gels, which mom said felt good. Lactation services reviewed with mom. Mom knows to call for questions/concerns.  Patient Name: Rhonda Vianey Caniglia [id number] Date: 09/05/2013 Reason for consult: Follow-up assessment;NICU baby   Maternal Data Formula Feeding for Exclusion: Yes (baby in NICU) Reason for exclusion: Admission to Intensive Care Unit (ICU) post-partum (mom with amnitic emboli/anaphylatctic s/DIC/ lost 3.5 L bloo) Infant to breast within first hour of birth: No Breastfeeding delayed due to:: Maternal status;Infant status (baby in RDS in NICU) Has patient been taught Hand Expression?: Yes Does the patient have breastfeeding experience prior to this delivery?: Yes  Feeding    LATCH Score/Interventions          Comfort (Breast/Nipple): Filling, red/small blisters or bruises, mild/mod discomfort  Problem noted: Mild/Moderate discomfort (pink,tender) Interventions (Mild/moderate discomfort): Comfort gels (EBM to nipples)        Lactation Tools  Discussed/Used Tools: Pump Breast pump type: Double-Electric Breast Pump WIC Program: No Pump Review: Setup, frequency, and cleaning;Milk Storage;Other (comment) (hand expression, teaching from NICU book on EBm) Initiated by:: c Ryot Burrous RN LC at 35 hours post partum - mom was too ill to pump yesterday Date initiated:: 09/05/13   Consult Status Consult Status: Follow-up Date: 09/06/13 Follow-up type: In-patient    Alfred Levins 09/05/2013, 3:56 PM

## 2013-09-05 NOTE — Progress Notes (Signed)
Pt transferred to [facility] rm#[id number] - this AICU RN will transfer & keep pt assignment.

## 2013-09-05 NOTE — Progress Notes (Signed)
As per Dr. Sherron Ales, do NOT d/c aline or epidural cath at this time and check CBC at 12noon.

## 2013-09-05 NOTE — Progress Notes (Signed)
May D/C IVF to NSL and remove foley as per Dr. Marice Potter. Check with anesthesia re: d/c of a-line and/or epidural cath. D/T low plts

## 2013-09-05 NOTE — Progress Notes (Signed)
Dr Jolayne Panther notified of CBC results - pt to transfer out of AICU

## 2013-09-05 NOTE — Progress Notes (Signed)
No blood return from Aline/unable to draw labs - phlebotomist drew labs via peripheral stick. Aline wave form now completely dampened & nonfunctional.

## 2013-09-05 NOTE — Progress Notes (Signed)
UR chart review completed.  

## 2013-09-05 NOTE — Progress Notes (Signed)
PLT= 104 results called to Dr Brayton Caves. Order rec'd to remove epidural & Aline.

## 2013-09-06 ENCOUNTER — Encounter (HOSPITAL_COMMUNITY): Payer: Self-pay | Admitting: Obstetrics & Gynecology

## 2013-09-06 ENCOUNTER — Encounter: Payer: BC Managed Care – PPO | Admitting: Family Medicine

## 2013-09-06 LAB — CBC
HCT: 23.6 % — ABNORMAL LOW (ref 36.0–46.0)
HCT: 23.3 % — ABNORMAL LOW (ref 36.0–46.0)
Hemoglobin: 8 g/dL — ABNORMAL LOW (ref 12.0–15.0)
MCH: 28.7 pg (ref 26.0–34.0)
MCHC: 36.4 g/dL — ABNORMAL HIGH (ref 30.0–36.0)
MCHC: 34.3 g/dL (ref 30.0–36.0)
MCV: 80.3 fL (ref 78.0–100.0)
MCV: 83.5 fL (ref 78.0–100.0)
Platelets: 89 10*3/uL — ABNORMAL LOW (ref 150–400)
RDW: 13.9 % (ref 11.5–15.5)
RDW: 15.3 % (ref 11.5–15.5)
WBC: 13.5 10*3/uL — ABNORMAL HIGH (ref 4.0–10.5)

## 2013-09-06 MED ORDER — MEASLES, MUMPS & RUBELLA VAC ~~LOC~~ INJ
0.5000 mL | INJECTION | Freq: Once | SUBCUTANEOUS | Status: AC
  Administered 2013-09-07: 0.5 mL via SUBCUTANEOUS
  Filled 2013-09-06: qty 0.5

## 2013-09-06 MED ORDER — DEXAMETHASONE SODIUM PHOSPHATE 10 MG/ML IJ SOLN
10.0000 mg | Freq: Once | INTRAMUSCULAR | Status: AC
  Administered 2013-09-06: 10 mg via INTRAVENOUS
  Filled 2013-09-06: qty 1

## 2013-09-06 MED ORDER — PROMETHAZINE HCL 25 MG/ML IJ SOLN
12.5000 mg | Freq: Once | INTRAMUSCULAR | Status: AC
  Administered 2013-09-06: 12.5 mg via INTRAVENOUS
  Filled 2013-09-06: qty 1

## 2013-09-06 MED ORDER — METOCLOPRAMIDE HCL 5 MG/ML IJ SOLN
10.0000 mg | Freq: Once | INTRAMUSCULAR | Status: AC
  Administered 2013-09-06: 10 mg via INTRAVENOUS
  Filled 2013-09-06: qty 2

## 2013-09-06 NOTE — Progress Notes (Signed)
Post Partum Day 2 Subjective: up ad lib, voiding, tolerating PO and pumping breast milk.  Pt reports a signficant headached which resolved overnight with IV Zosyn, Reglan and Decadron.   Pt currently has prodromal sx but, no HA.  She denies dizziness currently.  She has no SOB with movement but, had only minimal ambulation yesterday.   The infant is still in the NICU and is improving  Objective: Blood pressure 116/66, pulse 82, temperature 98.3 F (36.8 C), temperature source Oral, resp. rate 20, height 5\' 5"  (1.651 m), weight 226 lb 6.4 oz (102.694 kg), last menstrual period 12/22/2012, SpO2 97.00%, unknown if currently breastfeeding. Pt is pumping   Physical Exam:  General: alert and mild distress Lochia: appropriate Uterine Fundus: firm DVT Evaluation: No evidence of DVT seen on physical exam.   Recent Labs  09/05/13 1205 09/06/13 0510  HGB 8.5* 8.0*  HCT 23.6* 23.3*    Assessment/Plan: Plan for discharge tomorrow and Lactation consult Will observe overnight and eval status of the HA  LOS: 3 days   HARRAWAY-SMITH, Robin Petrakis 09/06/2013, 10:10 AM

## 2013-09-06 NOTE — Lactation Note (Signed)
This note was copied from the chart of Rhonda Adams. Lactation Consultation   Note  .Follow up consult with this mom of a NICU baby, now 56 hours post partum. Mom had a severe migraine yesterday evening, for which she was medicated, and slept all night until 10 am today. Mom was feeling much better, but still had a slight headache. I told her it was fine that she did not pump, and was glad she was feeling better. She did pump once she woke up, and expressed a tiny drop of colostrum, which she and dad were going to feed to their baby. I  Gave mom size 30 flanges, which are a better fit . I will follow up with this mom and baby tomorrow.  Patient Name: Rhonda Adams [id number] Date: 09/06/2013     Maternal Data    Feeding    LATCH Score/Interventions                      Lactation Tools Discussed/Used     Consult Status      Alfred Levins 09/06/2013, 3:25 PM

## 2013-09-07 ENCOUNTER — Encounter (HOSPITAL_COMMUNITY): Payer: Self-pay | Admitting: Obstetrics & Gynecology

## 2013-09-07 DIAGNOSIS — D65 Disseminated intravascular coagulation [defibrination syndrome]: Secondary | ICD-10-CM

## 2013-09-07 DIAGNOSIS — Z862 Personal history of diseases of the blood and blood-forming organs and certain disorders involving the immune mechanism: Secondary | ICD-10-CM

## 2013-09-07 LAB — TYPE AND SCREEN
ABO/RH(D): O POS
Antibody Screen: NEGATIVE
Unit division: 0
Unit division: 0
Unit division: 0
Unit division: 0
Unit division: 0
Unit division: 0

## 2013-09-07 MED ORDER — IBUPROFEN 600 MG PO TABS: 600.0000 mg | ORAL_TABLET | Freq: Four times a day (QID) | ORAL | Status: DC | PRN

## 2013-09-07 NOTE — Progress Notes (Signed)
Pt to visit  nicu and then ambulate with husband

## 2013-09-07 NOTE — Discharge Summary (Signed)
Obstetric Discharge Summary Reason for Admission: onset of labor Prenatal Procedures: none Intrapartum Procedures: spontaneous vaginal delivery and uterine exploration Postpartum Procedures: transfusion Bakri balloon placement, A-line and monitoring in ICU Complications-Operative and Postpartum: hemorrhage and possible amniotic fluid embolus Hemoglobin  Date Value Range Status  09/06/2013 8.0* 12.0 - 15.0 g/dL Final     HCT  Date Value Range Status  09/06/2013 23.3* 36.0 - 46.0 % Final    Physical Exam:  General: alert, cooperative and no distress Lochia: appropriate Uterine Fundus: firm DVT Evaluation: No evidence of DVT seen on physical exam.  Discharge Diagnoses: AFE, hemorrhage, preterm delivery  Discharge Information: Date: 09/07/2013 Activity: pelvic rest Diet: routine Medications: Ibuprofen Condition: improved Instructions: refer to practice specific booklet Discharge to: home   Newborn Data: Live born female  Birth Weight: 7 lb 0.5 oz (3188 g) APGAR: 2, 5  Baby in ICU ARNOLD,JAMES 09/07/2013, 10:36 AM

## 2013-09-07 NOTE — Progress Notes (Signed)
09/07/13 1400  Clinical Encounter Type  Visited With Patient and family together (husband)  Visit Type Follow-up;Spiritual support;Social support  Spiritual Encounters  Spiritual Needs Emotional  Stress Factors  Family Stress Factors (processing stress surrounding delivery)   Visited with parents on WU, providing pastoral listening and reflection as they shared about the stressful events surrounding delivery.  They are both relieved that [place] and baby are doing well, though her husband reports that he hasn't really had time or space to process his feelings about how worrisome it was at the time.  Now that he has finally gotten sleep, he anticipates that he will move through the emotions once he has some more downtime alone.  We talked about the different ways that people process and work through stress, and I encouraged him to make space to process his feelings, too.  Family is aware of ongoing chaplain availability.  Spiritual Care will follow.  [address] [place], [place] [phone]

## 2013-09-07 NOTE — Lactation Note (Signed)
This note was copied from the chart of Rhonda Andrell Tallman. Lactation Consultation Note      Follow up brief consult with this mom and dad. Mom is being discharged to home today. She has a DEP at home. She is beginning to transition into mature milk, expressing about 5-10 mls. Mom was excited she got to hold her baby skin to skin. I will follow this family in the NICU.  Patient Name: Rhonda Adams [id number] Date: 09/07/2013 Reason for consult: Follow-up assessment;NICU baby   Maternal Data    Feeding Feeding Type: Breast Milk with Formula added Nipple Type: Slow - flow Length of feed: 20 min  LATCH Score/Interventions                      Lactation Tools Discussed/Used     Consult Status Consult Status: PRN Follow-up type: In-patient (in NICU)    Rhonda Adams 09/07/2013, 3:00 PM

## 2013-09-07 NOTE — Addendum Note (Signed)
Addendum created 09/07/13 1017 by Gaylan Gerold, MD   Modules edited: Anesthesia Events

## 2013-10-12 ENCOUNTER — Ambulatory Visit (INDEPENDENT_AMBULATORY_CARE_PROVIDER_SITE_OTHER): Payer: BC Managed Care – PPO | Admitting: Obstetrics & Gynecology

## 2013-10-12 ENCOUNTER — Encounter: Payer: Self-pay | Admitting: Obstetrics & Gynecology

## 2013-10-12 ENCOUNTER — Telehealth: Payer: Self-pay | Admitting: *Deleted

## 2013-10-12 DIAGNOSIS — IMO0001 Reserved for inherently not codable concepts without codable children: Secondary | ICD-10-CM

## 2013-10-12 MED ORDER — NORGESTREL-ETHINYL ESTRADIOL 0.3-30 MG-MCG PO TABS: 1.0000 | ORAL_TABLET | Freq: Every day | ORAL | Status: DC

## 2013-10-12 NOTE — Progress Notes (Signed)
  Subjective:    Patient ID: Rhonda Adams, female    DOB: 07-24-1976, 37 y.o.   MRN: [id number]  HPI  She is here here for her 6 week pp visit. Her delivery was complicated by DIC, presumably from a AFE. She did well and went home on PPD #3 after multiple transfusions. She denies pp depression and scored 4 on the test. She had not had sex yet since delivery and plans to start OCPs. She is now bottle feeding. She reports normal bowel and bladder function. Baby is doing well, got a circumcision prior to discharge home.    Review of Systems Paps always normal Flu vaccine during pregnancy    Objective:   Physical Exam  Normal vulva/vagina NSSA, NT, mobile uterus and normal adnexa      Assessment & Plan:   PP- doing well RTC for annual in 1 year Lo/Ovral for contraception. Recommend back up for a month

## 2013-10-12 NOTE — Telephone Encounter (Signed)
Patient called because her rx for ocp was not sent over to her pharmacy.  After reading Dr. Norman Clay notes, I send prescription for Lo ovral to patients pharmacy.

## 2013-10-12 NOTE — Patient Instructions (Signed)

## 2014-05-25 ENCOUNTER — Telehealth: Payer: Self-pay | Admitting: *Deleted

## 2014-05-25 DIAGNOSIS — Z803 Family history of malignant neoplasm of breast: Secondary | ICD-10-CM

## 2014-05-25 NOTE — Telephone Encounter (Signed)
Patient was told to call back to get mammogram set up.  This will be her first screening mammogram to be done at the breast center.

## 2014-06-01 ENCOUNTER — Ambulatory Visit
Admission: RE | Admit: 2014-06-01 | Discharge: 2014-06-01 | Disposition: A | Discharge status: Home / Self Care | Payer: BC Managed Care – PPO | Source: Ambulatory Visit | Attending: Family Medicine | Admitting: Family Medicine

## 2014-06-01 DIAGNOSIS — Z803 Family history of malignant neoplasm of breast: Secondary | ICD-10-CM

## 2014-09-08 ENCOUNTER — Telehealth: Payer: Self-pay

## 2014-09-08 NOTE — Telephone Encounter (Signed)
Patient ran out of birth control. Gave her a month's worth. Called it in to her pharmacy [facility] in [place]. Made her an appointment for annual on 09/22/14.

## 2014-09-22 ENCOUNTER — Encounter: Payer: Self-pay | Admitting: Obstetrics & Gynecology

## 2014-09-22 ENCOUNTER — Ambulatory Visit (INDEPENDENT_AMBULATORY_CARE_PROVIDER_SITE_OTHER): Payer: BC Managed Care – PPO | Admitting: Obstetrics & Gynecology

## 2014-09-22 VITALS — BP 117/79 | HR 85 | Ht 65.0 in | Wt 214.8 lb

## 2014-09-22 DIAGNOSIS — Z23 Encounter for immunization: Secondary | ICD-10-CM

## 2014-09-22 DIAGNOSIS — Z1151 Encounter for screening for human papillomavirus (HPV): Secondary | ICD-10-CM

## 2014-09-22 DIAGNOSIS — Z0000 Encounter for general adult medical examination without abnormal findings: Secondary | ICD-10-CM

## 2014-09-22 DIAGNOSIS — Z124 Encounter for screening for malignant neoplasm of cervix: Secondary | ICD-10-CM

## 2014-09-22 MED ORDER — NORETHIN ACE-ETH ESTRAD-FE 1-20 MG-MCG(24) PO TABS: 1.0000 | ORAL_TABLET | Freq: Every day | ORAL | Status: DC

## 2014-09-22 NOTE — Progress Notes (Signed)
Subjective:    Rhonda Adams is a 39 y.o. female who presents for an annual exam. The patient has no complaints today except that her OCPs give her a non-aura type migraine. She is interested in a BTL. Her husband had planned to get a vasectomy, but just hasn't gotten around to it. The patient is sexually active. GYN screening history: last pap: was normal. The patient wears seatbelts: yes. The patient participates in regular exercise: no. Has the patient ever been transfused or tattooed?: yes. (many units of PRBC with DIC after last baby and presumed AFE :( )The patient reports that there is not domestic violence in her life.   Menstrual History: OB History   Grav Para Term Preterm Abortions TAB SAB Ect Mult Living   6 2 0 2 4 0 4 0 0 2       Menarche age: 74  Patient's last menstrual period was 09/09/2014.    The following portions of the patient's history were reviewed and updated as appropriate: allergies, current medications, past family history, past medical history, past social history, past surgical history and problem list.  Review of Systems A comprehensive review of systems was negative.    Objective:    BP 117/79  Pulse 85  Ht 5\' 5"  (1.651 m)  Wt 214 lb 12.8 oz (97.433 kg)  BMI 35.74 kg/m2  LMP 09/09/2014  General Appearance:    Alert, cooperative, no distress, appears stated age  Head:    Normocephalic, without obvious abnormality, atraumatic  Eyes:    PERRL, conjunctiva/corneas clear, EOM's intact, fundi    benign, both eyes  Ears:    Normal TM's and external ear canals, both ears  Nose:   Nares normal, septum midline, mucosa normal, no drainage    or sinus tenderness  Throat:   Lips, mucosa, and tongue normal; teeth and gums normal  Neck:   Supple, symmetrical, trachea midline, no adenopathy;    thyroid:  no enlargement/tenderness/nodules; no carotid   bruit or JVD  Back:     Symmetric, no curvature, ROM normal, no CVA tenderness  Lungs:     Clear to auscultation  bilaterally, respirations unlabored  Chest Wall:    No tenderness or deformity   Heart:    Regular rate and rhythm, S1 and S2 normal, no murmur, rub   or gallop  Breast Exam:    No tenderness, masses, or nipple abnormality  Abdomen:     Soft, non-tender, bowel sounds active all four quadrants,    no masses, no organomegaly  Genitalia:    Normal female without lesion, discharge or tenderness     Extremities:   Extremities normal, atraumatic, no cyanosis or edema  Pulses:   2+ and symmetric all extremities  Skin:   Skin color, texture, turgor normal, no rashes or lesions  Lymph nodes:   Cervical, supraclavicular, and axillary nodes normal  Neurologic:   CNII-XII intact, normal strength, sensation and reflexes    throughout  .    Assessment:    Healthy female exam.    Plan:     Breast self exam technique reviewed and patient encouraged to perform self-exam monthly. Thin prep Pap smear. with cotesting

## 2014-09-25 LAB — CYTOLOGY - PAP

## 2014-10-06 ENCOUNTER — Inpatient Hospital Stay (HOSPITAL_COMMUNITY)
Admission: EM | Admit: 2014-10-06 | Discharge: 2014-10-08 | DRG: 494 | Disposition: A | Discharge status: Home / Self Care | Payer: BC Managed Care – PPO | Attending: Orthopedic Surgery | Admitting: Orthopedic Surgery

## 2014-10-06 ENCOUNTER — Encounter (HOSPITAL_COMMUNITY): Payer: Self-pay | Admitting: Emergency Medicine

## 2014-10-06 DIAGNOSIS — Z8249 Family history of ischemic heart disease and other diseases of the circulatory system: Secondary | ICD-10-CM

## 2014-10-06 DIAGNOSIS — Z803 Family history of malignant neoplasm of breast: Secondary | ICD-10-CM

## 2014-10-06 DIAGNOSIS — M25571 Pain in right ankle and joints of right foot: Secondary | ICD-10-CM | POA: Diagnosis not present

## 2014-10-06 DIAGNOSIS — S82851A Displaced trimalleolar fracture of right lower leg, initial encounter for closed fracture: Secondary | ICD-10-CM | POA: Diagnosis not present

## 2014-10-06 DIAGNOSIS — Z91018 Allergy to other foods: Secondary | ICD-10-CM

## 2014-10-06 DIAGNOSIS — G43909 Migraine, unspecified, not intractable, without status migrainosus: Secondary | ICD-10-CM | POA: Diagnosis present

## 2014-10-06 DIAGNOSIS — E669 Obesity, unspecified: Secondary | ICD-10-CM | POA: Diagnosis present

## 2014-10-06 DIAGNOSIS — Z833 Family history of diabetes mellitus: Secondary | ICD-10-CM

## 2014-10-06 DIAGNOSIS — S82899A Other fracture of unspecified lower leg, initial encounter for closed fracture: Secondary | ICD-10-CM | POA: Diagnosis present

## 2014-10-06 DIAGNOSIS — IMO0001 Reserved for inherently not codable concepts without codable children: Secondary | ICD-10-CM

## 2014-10-06 DIAGNOSIS — Z6836 Body mass index (BMI) 36.0-36.9, adult: Secondary | ICD-10-CM

## 2014-10-06 DIAGNOSIS — Z7982 Long term (current) use of aspirin: Secondary | ICD-10-CM

## 2014-10-06 DIAGNOSIS — Y939 Activity, unspecified: Secondary | ICD-10-CM

## 2014-10-06 DIAGNOSIS — Z9889 Other specified postprocedural states: Secondary | ICD-10-CM

## 2014-10-06 DIAGNOSIS — S82891A Other fracture of right lower leg, initial encounter for closed fracture: Secondary | ICD-10-CM

## 2014-10-06 DIAGNOSIS — T148XXA Other injury of unspecified body region, initial encounter: Secondary | ICD-10-CM

## 2014-10-06 LAB — CBC
HCT: 41 % (ref 36.0–46.0)
Hemoglobin: 13.4 g/dL (ref 12.0–15.0)
MCH: 29.3 pg (ref 26.0–34.0)
MCHC: 32.7 g/dL (ref 30.0–36.0)
MCV: 89.5 fL (ref 78.0–100.0)
Platelets: 200 10*3/uL (ref 150–400)
RBC: 4.58 MIL/uL (ref 3.87–5.11)
RDW: 13.5 % (ref 11.5–15.5)
WBC: 8 10*3/uL (ref 4.0–10.5)

## 2014-10-06 MED ORDER — FENTANYL CITRATE 0.05 MG/ML IJ SOLN
50.0000 ug | Freq: Once | INTRAMUSCULAR | Status: AC
  Administered 2014-10-06: 50 ug via INTRAVENOUS
  Filled 2014-10-06: qty 2

## 2014-10-06 MED ORDER — MORPHINE SULFATE 4 MG/ML IJ SOLN
4.0000 mg | Freq: Once | INTRAMUSCULAR | Status: AC
  Administered 2014-10-07: 4 mg via INTRAVENOUS
  Filled 2014-10-06: qty 1

## 2014-10-06 MED ORDER — ONDANSETRON HCL 4 MG/2ML IJ SOLN
4.0000 mg | Freq: Once | INTRAMUSCULAR | Status: AC
Start: 2014-10-07 — End: 2014-10-07
  Administered 2014-10-07: 4 mg via INTRAVENOUS
  Filled 2014-10-06: qty 2

## 2014-10-06 NOTE — ED Notes (Signed)
ems reports patient stood, became mildly dizzy, and fell twisting right ankle.  Arrives axo, c/o right ankle pain.  Received 163mcg fentanyl pta via ems

## 2014-10-07 ENCOUNTER — Inpatient Hospital Stay (HOSPITAL_COMMUNITY): Payer: BC Managed Care – PPO

## 2014-10-07 ENCOUNTER — Encounter (HOSPITAL_COMMUNITY): Payer: BC Managed Care – PPO | Admitting: Anesthesiology

## 2014-10-07 ENCOUNTER — Encounter (HOSPITAL_COMMUNITY)
Admission: EM | Disposition: A | Discharge status: Home / Self Care | Payer: Self-pay | Source: Home / Self Care | Attending: Orthopedic Surgery

## 2014-10-07 ENCOUNTER — Emergency Department (HOSPITAL_COMMUNITY): Payer: BC Managed Care – PPO

## 2014-10-07 ENCOUNTER — Inpatient Hospital Stay (HOSPITAL_COMMUNITY): Payer: BC Managed Care – PPO | Admitting: Anesthesiology

## 2014-10-07 ENCOUNTER — Encounter (HOSPITAL_COMMUNITY): Payer: Self-pay | Admitting: Certified Registered Nurse Anesthetist

## 2014-10-07 DIAGNOSIS — Z7982 Long term (current) use of aspirin: Secondary | ICD-10-CM | POA: Diagnosis not present

## 2014-10-07 DIAGNOSIS — M25571 Pain in right ankle and joints of right foot: Secondary | ICD-10-CM | POA: Diagnosis present

## 2014-10-07 DIAGNOSIS — G43909 Migraine, unspecified, not intractable, without status migrainosus: Secondary | ICD-10-CM | POA: Diagnosis present

## 2014-10-07 DIAGNOSIS — S82851A Displaced trimalleolar fracture of right lower leg, initial encounter for closed fracture: Secondary | ICD-10-CM | POA: Diagnosis present

## 2014-10-07 DIAGNOSIS — Z8249 Family history of ischemic heart disease and other diseases of the circulatory system: Secondary | ICD-10-CM | POA: Diagnosis not present

## 2014-10-07 DIAGNOSIS — Z803 Family history of malignant neoplasm of breast: Secondary | ICD-10-CM | POA: Diagnosis not present

## 2014-10-07 DIAGNOSIS — E669 Obesity, unspecified: Secondary | ICD-10-CM | POA: Diagnosis present

## 2014-10-07 DIAGNOSIS — Y939 Activity, unspecified: Secondary | ICD-10-CM | POA: Diagnosis not present

## 2014-10-07 DIAGNOSIS — Z833 Family history of diabetes mellitus: Secondary | ICD-10-CM | POA: Diagnosis not present

## 2014-10-07 DIAGNOSIS — S82899A Other fracture of unspecified lower leg, initial encounter for closed fracture: Secondary | ICD-10-CM | POA: Diagnosis present

## 2014-10-07 DIAGNOSIS — Z6836 Body mass index (BMI) 36.0-36.9, adult: Secondary | ICD-10-CM | POA: Diagnosis not present

## 2014-10-07 DIAGNOSIS — Z91018 Allergy to other foods: Secondary | ICD-10-CM | POA: Diagnosis not present

## 2014-10-07 HISTORY — PX: ORIF ANKLE FRACTURE: SHX5408

## 2014-10-07 LAB — BASIC METABOLIC PANEL
Anion gap: 11 (ref 5–15)
BUN: 16 mg/dL (ref 6–23)
CO2: 22 mEq/L (ref 19–32)
Calcium: 8.5 mg/dL (ref 8.4–10.5)
Chloride: 105 mEq/L (ref 96–112)
Creatinine, Ser: 0.66 mg/dL (ref 0.50–1.10)
Glucose, Bld: 137 mg/dL — ABNORMAL HIGH (ref 70–99)
POTASSIUM: 3.7 meq/L (ref 3.7–5.3)
SODIUM: 138 meq/L (ref 137–147)

## 2014-10-07 LAB — POC URINE PREG, ED: PREG TEST UR: NEGATIVE

## 2014-10-07 SURGERY — OPEN REDUCTION INTERNAL FIXATION (ORIF) ANKLE FRACTURE
Anesthesia: General | Laterality: Right

## 2014-10-07 MED ORDER — DEXAMETHASONE SODIUM PHOSPHATE 4 MG/ML IJ SOLN
INTRAMUSCULAR | Status: AC
  Filled 2014-10-07: qty 1

## 2014-10-07 MED ORDER — LIDOCAINE-EPINEPHRINE (PF) 1.5 %-1:200000 IJ SOLN
INTRAMUSCULAR | Status: DC | PRN
  Administered 2014-10-07: 30 mL via PERINEURAL

## 2014-10-07 MED ORDER — CHLORHEXIDINE GLUCONATE 4 % EX LIQD
60.0000 mL | Freq: Once | CUTANEOUS | Status: AC
Start: 2014-10-07 — End: 2014-10-07
  Administered 2014-10-07: 4 via TOPICAL
  Filled 2014-10-07: qty 60

## 2014-10-07 MED ORDER — MIDAZOLAM HCL 2 MG/2ML IJ SOLN
INTRAMUSCULAR | Status: AC
  Filled 2014-10-07: qty 2

## 2014-10-07 MED ORDER — ONDANSETRON HCL 4 MG/2ML IJ SOLN
INTRAMUSCULAR | Status: DC | PRN
  Administered 2014-10-07: 4 mg via INTRAVENOUS

## 2014-10-07 MED ORDER — RIVAROXABAN 10 MG PO TABS
10.0000 mg | ORAL_TABLET | Freq: Every day | ORAL | Status: DC
Start: 2014-10-07 — End: 2016-02-08

## 2014-10-07 MED ORDER — MIDAZOLAM HCL 5 MG/5ML IJ SOLN
INTRAMUSCULAR | Status: DC | PRN
  Administered 2014-10-07: 2 mg via INTRAVENOUS

## 2014-10-07 MED ORDER — PROPOFOL 10 MG/ML IV BOLUS
INTRAVENOUS | Status: DC | PRN
  Administered 2014-10-07 (×2): 20 mg via INTRAVENOUS
  Administered 2014-10-07: 10 mg via INTRAVENOUS
  Administered 2014-10-07: 120 mg via INTRAVENOUS
  Administered 2014-10-07: 10 mg via INTRAVENOUS
  Administered 2014-10-07: 20 mg via INTRAVENOUS

## 2014-10-07 MED ORDER — METHOCARBAMOL 500 MG PO TABS: 500.0000 mg | ORAL_TABLET | Freq: Four times a day (QID) | ORAL | Status: DC

## 2014-10-07 MED ORDER — LIDOCAINE HCL (CARDIAC) 20 MG/ML IV SOLN
INTRAVENOUS | Status: DC | PRN
  Administered 2014-10-07: 100 mg via INTRAVENOUS

## 2014-10-07 MED ORDER — ETOMIDATE 2 MG/ML IV SOLN
10.0000 mg | Freq: Once | INTRAVENOUS | Status: DC
  Filled 2014-10-07: qty 10

## 2014-10-07 MED ORDER — CEFAZOLIN SODIUM-DEXTROSE 2-3 GM-% IV SOLR
2.0000 g | INTRAVENOUS | Status: AC
  Administered 2014-10-07: 2 g via INTRAVENOUS
  Filled 2014-10-07: qty 50

## 2014-10-07 MED ORDER — BUPIVACAINE-EPINEPHRINE (PF) 0.5% -1:200000 IJ SOLN
INTRAMUSCULAR | Status: DC | PRN
  Administered 2014-10-07: 30 mL via PERINEURAL

## 2014-10-07 MED ORDER — 0.9 % SODIUM CHLORIDE (POUR BTL) OPTIME
TOPICAL | Status: DC | PRN
  Administered 2014-10-07: 1000 mL
  Administered 2014-10-07: 2000 mL

## 2014-10-07 MED ORDER — RIVAROXABAN 10 MG PO TABS: 10.0000 mg | ORAL_TABLET | Freq: Every day | ORAL | Status: DC

## 2014-10-07 MED ORDER — FENTANYL CITRATE 0.05 MG/ML IJ SOLN
INTRAMUSCULAR | Status: DC | PRN
  Administered 2014-10-07 (×5): 50 ug via INTRAVENOUS

## 2014-10-07 MED ORDER — METOCLOPRAMIDE HCL 5 MG PO TABS
5.0000 mg | ORAL_TABLET | Freq: Three times a day (TID) | ORAL | Status: DC | PRN
  Filled 2014-10-07: qty 2

## 2014-10-07 MED ORDER — POTASSIUM CHLORIDE IN NACL 20-0.9 MEQ/L-% IV SOLN
INTRAVENOUS | Status: DC
  Filled 2014-10-07 (×2): qty 1000

## 2014-10-07 MED ORDER — HYDROMORPHONE HCL 1 MG/ML IJ SOLN: 0.5000 mg | INTRAMUSCULAR | Status: DC | PRN

## 2014-10-07 MED ORDER — FENTANYL CITRATE 0.05 MG/ML IJ SOLN
INTRAMUSCULAR | Status: AC
  Filled 2014-10-07: qty 5

## 2014-10-07 MED ORDER — ONDANSETRON HCL 4 MG PO TABS: 4.0000 mg | ORAL_TABLET | Freq: Four times a day (QID) | ORAL | Status: DC | PRN

## 2014-10-07 MED ORDER — LACTATED RINGERS IV SOLN
INTRAVENOUS | Status: DC | PRN
  Administered 2014-10-07 (×3): via INTRAVENOUS

## 2014-10-07 MED ORDER — ASPIRIN-ACETAMINOPHEN-CAFFEINE 250-250-65 MG PO TABS
1.0000 | ORAL_TABLET | Freq: Four times a day (QID) | ORAL | Status: DC | PRN
  Filled 2014-10-07: qty 1

## 2014-10-07 MED ORDER — ASPIRIN 81 MG PO CHEW: 81.0000 mg | CHEWABLE_TABLET | Freq: Every day | ORAL | Status: DC

## 2014-10-07 MED ORDER — OXYCODONE-ACETAMINOPHEN 5-325 MG PO TABS
2.0000 | ORAL_TABLET | ORAL | Status: DC | PRN
  Administered 2014-10-07 – 2014-10-08 (×5): 2 via ORAL
  Filled 2014-10-07 (×6): qty 2

## 2014-10-07 MED ORDER — ONDANSETRON HCL 4 MG/2ML IJ SOLN: 4.0000 mg | Freq: Four times a day (QID) | INTRAMUSCULAR | Status: DC | PRN

## 2014-10-07 MED ORDER — DEXAMETHASONE SODIUM PHOSPHATE 4 MG/ML IJ SOLN
INTRAMUSCULAR | Status: DC | PRN
  Administered 2014-10-07: 4 mg via INTRAVENOUS

## 2014-10-07 MED ORDER — RIVAROXABAN 10 MG PO TABS
10.0000 mg | ORAL_TABLET | Freq: Every day | ORAL | Status: DC
  Administered 2014-10-07 – 2014-10-08 (×2): 10 mg via ORAL
  Filled 2014-10-07 (×2): qty 1

## 2014-10-07 MED ORDER — OXYCODONE-ACETAMINOPHEN 10-325 MG PO TABS: 1.0000 | ORAL_TABLET | Freq: Four times a day (QID) | ORAL | Status: DC | PRN

## 2014-10-07 MED ORDER — ROCURONIUM BROMIDE 100 MG/10ML IV SOLN
INTRAVENOUS | Status: DC | PRN
  Administered 2014-10-07: 50 mg via INTRAVENOUS

## 2014-10-07 MED ORDER — HYDROMORPHONE HCL 1 MG/ML IJ SOLN
1.0000 mg | Freq: Once | INTRAMUSCULAR | Status: AC
  Administered 2014-10-07: 1 mg via INTRAVENOUS
  Filled 2014-10-07: qty 1

## 2014-10-07 MED ORDER — METOCLOPRAMIDE HCL 5 MG/ML IJ SOLN: 5.0000 mg | Freq: Three times a day (TID) | INTRAMUSCULAR | Status: DC | PRN

## 2014-10-07 SURGICAL SUPPLY — 73 items
BANDAGE ELASTIC 4 VELCRO ST LF (GAUZE/BANDAGES/DRESSINGS) ×3 IMPLANT
BANDAGE ELASTIC 6 VELCRO ST LF (GAUZE/BANDAGES/DRESSINGS) ×3 IMPLANT
BIT DRILL 3.5 QC 155 (BIT) ×1 IMPLANT
BIT DRILL 3.5 QC 155MM (BIT) ×1
BIT DRILL QC 2.7 6.3IN  SHORT (BIT) ×4
BIT DRILL QC 2.7 6.3IN SHORT (BIT) IMPLANT
BLADE SURG 10 STRL SS (BLADE) IMPLANT
BNDG CMPR 9X4 STRL LF SNTH (GAUZE/BANDAGES/DRESSINGS) ×1
BNDG COHESIVE 4X5 TAN STRL (GAUZE/BANDAGES/DRESSINGS) ×2 IMPLANT
BNDG COHESIVE 6X5 TAN STRL LF (GAUZE/BANDAGES/DRESSINGS) ×3 IMPLANT
BNDG ESMARK 4X9 LF (GAUZE/BANDAGES/DRESSINGS) ×3 IMPLANT
BNDG GAUZE ELAST 4 BULKY (GAUZE/BANDAGES/DRESSINGS) ×3 IMPLANT
COVER MAYO STAND STRL (DRAPES) ×3 IMPLANT
COVER SURGICAL LIGHT HANDLE (MISCELLANEOUS) ×3 IMPLANT
CUFF TOURNIQUET SINGLE 34IN LL (TOURNIQUET CUFF) IMPLANT
CUFF TOURNIQUET SINGLE 44IN (TOURNIQUET CUFF) IMPLANT
DRAPE C-ARM 42X72 X-RAY (DRAPES) ×3 IMPLANT
DRAPE INCISE IOBAN 66X45 STRL (DRAPES) IMPLANT
DRAPE SURG 17X23 STRL (DRAPES) ×3 IMPLANT
DRAPE U-SHAPE 47X51 STRL (DRAPES) ×3 IMPLANT
DRSG PAD ABDOMINAL 8X10 ST (GAUZE/BANDAGES/DRESSINGS) ×3 IMPLANT
DURAPREP 26ML APPLICATOR (WOUND CARE) IMPLANT
ELECT REM PT RETURN 9FT ADLT (ELECTROSURGICAL) ×3
ELECTRODE REM PT RTRN 9FT ADLT (ELECTROSURGICAL) ×1 IMPLANT
FACESHIELD WRAPAROUND (MASK) ×3 IMPLANT
FACESHIELD WRAPAROUND OR TEAM (MASK) ×1 IMPLANT
GAUZE SPONGE 4X4 12PLY STRL (GAUZE/BANDAGES/DRESSINGS) ×3 IMPLANT
GAUZE XEROFORM 5X9 LF (GAUZE/BANDAGES/DRESSINGS) ×3 IMPLANT
GLOVE BIOGEL PI IND STRL 8 (GLOVE) ×1 IMPLANT
GLOVE BIOGEL PI INDICATOR 8 (GLOVE) ×2
GLOVE SURG ORTHO 8.0 STRL STRW (GLOVE) ×3 IMPLANT
GOWN STRL REUS W/ TWL LRG LVL3 (GOWN DISPOSABLE) ×3 IMPLANT
GOWN STRL REUS W/TWL LRG LVL3 (GOWN DISPOSABLE) ×9
HANDPIECE INTERPULSE COAX TIP (DISPOSABLE)
KIT BASIN OR (CUSTOM PROCEDURE TRAY) ×3 IMPLANT
KIT ROOM TURNOVER OR (KITS) ×3 IMPLANT
MANIFOLD NEPTUNE II (INSTRUMENTS) ×3 IMPLANT
NDL HYPO 25GX1X1/2 BEV (NEEDLE) ×1 IMPLANT
NEEDLE HYPO 25GX1X1/2 BEV (NEEDLE) ×3 IMPLANT
NS IRRIG 1000ML POUR BTL (IV SOLUTION) ×3 IMPLANT
PACK ORTHO EXTREMITY (CUSTOM PROCEDURE TRAY) ×3 IMPLANT
PAD ARMBOARD 7.5X6 YLW CONV (MISCELLANEOUS) ×6 IMPLANT
PAD CAST 4YDX4 CTTN HI CHSV (CAST SUPPLIES) ×2 IMPLANT
PADDING CAST COTTON 4X4 STRL (CAST SUPPLIES) ×3
PADDING CAST COTTON 6X4 STRL (CAST SUPPLIES) ×2 IMPLANT
PIN GUIDE 3.2X14 1401214 (MISCELLANEOUS) ×4 IMPLANT
PLATE FIBULAR DISTAL 4H (Plate) ×2 IMPLANT
SCREW CANC 5.0X14 (Screw) ×2 IMPLANT
SCREW CANNULATED 4.1X40 (Screw) ×2 IMPLANT
SCREW LOCK 10X3.5XST NS (Screw) IMPLANT
SCREW LOCK 3.5X10 (Screw) ×6 IMPLANT
SCREW NL 3.5X22 (Screw) ×2 IMPLANT
SCREW NONLOCK 3.5X10 (Screw) ×4 IMPLANT
SET HNDPC FAN SPRY TIP SCT (DISPOSABLE) IMPLANT
SPLINT PLASTER CAST XFAST 5X30 (CAST SUPPLIES) IMPLANT
SPLINT PLASTER XFAST SET 5X30 (CAST SUPPLIES) ×8
SPONGE GAUZE 4X4 12PLY STER LF (GAUZE/BANDAGES/DRESSINGS) ×2 IMPLANT
STOCKINETTE IMPERVIOUS 9X36 MD (GAUZE/BANDAGES/DRESSINGS) IMPLANT
STOCKINETTE IMPERVIOUS LG (DRAPES) ×2 IMPLANT
SUCTION FRAZIER TIP 10 FR DISP (SUCTIONS) ×3 IMPLANT
SUT ETHILON 3 0 PS 1 (SUTURE) ×14 IMPLANT
SUT VIC AB 2-0 CT1 27 (SUTURE) ×3
SUT VIC AB 2-0 CT1 TAPERPNT 27 (SUTURE) IMPLANT
SUT VIC AB 2-0 CTB1 (SUTURE) ×6 IMPLANT
SUT VIC AB 3-0 SH 27 (SUTURE) ×9
SUT VIC AB 3-0 SH 27X BRD (SUTURE) ×2 IMPLANT
SYR CONTROL 10ML LL (SYRINGE) ×3 IMPLANT
TOWEL OR 17X24 6PK STRL BLUE (TOWEL DISPOSABLE) ×3 IMPLANT
TOWEL OR 17X26 10 PK STRL BLUE (TOWEL DISPOSABLE) ×3 IMPLANT
TUBE CONNECTING 12'X1/4 (SUCTIONS) ×1
TUBE CONNECTING 12X1/4 (SUCTIONS) ×2 IMPLANT
WATER STERILE IRR 1000ML POUR (IV SOLUTION) ×3 IMPLANT
YANKAUER SUCT BULB TIP NO VENT (SUCTIONS) IMPLANT

## 2014-10-07 NOTE — Progress Notes (Signed)
Diagnosis right ankle fracture dislocation  postop diagnosis same  procedure right ankle fracture reduction under conscious sedation  surgeon Alphonzo Severance anesthesia Dr. Vanita Panda from emergency room  indications paragraph this is a patient with right ankle fracture dislocation presents for reduction prior to surgical fixation procedure in detail, time out was called, he was given examination knee flexion and traction the ankle fracture was reduced and splinted on the right-hand side postreduction radiographs pending plan for surgery later today skin remained intact during the reduction

## 2014-10-07 NOTE — ED Provider Notes (Signed)
CSN: [id number]     Arrival date & time 10/06/14  2312 History   First MD Initiated Contact with Patient 10/06/14 2346     Chief Complaint  Patient presents with  . Ankle Pain     (Consider location/radiation/quality/duration/timing/severity/associated sxs/prior Treatment) HPI Comments: Patient is a 38 year old female who presents with right ankle pain that started prior to arrival when was getting up from her daughter's bed to walk into her room. Patient reports her right foot twisted and she fell. She reports sudden onset of right ankle pain that is throbbing and severe without radiation. Patient reports obvious deformity of right ankle. No other injuries. Patient denies head trauma. Movement and palpation of the right ankle makes the pain worse. No alleviating factors.    Past Medical History  Diagnosis Date  . Ovarian cyst   . Migraine   . Preterm labor 2012   Past Surgical History  Procedure Laterality Date  . Dilation and curettage of uterus    . Miscarriage    . Arthroscopic repair acl      left  . Dilation and evacuation N/A 09/04/2013    Procedure: exam under anesthesia placement Bakri balloon;  Surgeon: Woodroe Mode, MD;  Location: [facility];  Service: Gynecology;  Laterality: N/A;   Family History  Problem Relation Age of Onset  . Cancer Mother 22    BREAST  . Hypertension Father   . Heart disease Maternal Grandmother   . Diabetes Maternal Grandfather   . Stroke Paternal Grandfather   . Hearing loss Neg Hx    History  Substance Use Topics  . Smoking status: Never Smoker   . Smokeless tobacco: Never Used  . Alcohol Use: Yes     Comment: rare, not with preg   OB History   Grav Para Term Preterm Abortions TAB SAB Ect Mult Living   6 2 0 2 4 0 4 0 0 2      Review of Systems  Constitutional: Negative for fever, chills and fatigue.  HENT: Negative for trouble swallowing.   Eyes: Negative for visual disturbance.  Respiratory: Negative for shortness of breath.    Cardiovascular: Negative for chest pain and palpitations.  Gastrointestinal: Negative for nausea, vomiting, abdominal pain and diarrhea.  Genitourinary: Negative for dysuria and difficulty urinating.  Musculoskeletal: Positive for arthralgias. Negative for neck pain.  Skin: Negative for color change.  Neurological: Negative for dizziness and weakness.  Psychiatric/Behavioral: Negative for dysphoric mood.      Allergies  Bacon flavor  Home Medications   Prior to Admission medications   Medication Sig Start Date End Date Taking? Authorizing Provider  aspirin 81 MG chewable tablet Chew 81 mg by mouth daily.   Yes Historical Provider, MD  aspirin-acetaminophen-caffeine (EXCEDRIN MIGRAINE) [phone] MG per tablet Take 1 tablet by mouth every 6 (six) hours as needed for headache.   Yes Historical Provider, MD  ibuprofen (ADVIL,MOTRIN) 600 MG tablet Take 1 tablet (600 mg total) by mouth every 6 (six) hours as needed for pain. 09/07/13  Yes Woodroe Mode, MD  Multiple Vitamins-Minerals (MULTIVITAMIN WITH MINERALS) tablet Take 1 tablet by mouth daily.   Yes Historical Provider, MD  Norethindrone Acetate-Ethinyl Estrad-FE (LOESTRIN 24 FE) 1-20 MG-MCG(24) tablet Take 1 tablet by mouth daily. 09/22/14  Yes Myra Marijo Sanes, MD   BP 125/76  Pulse 97  Temp(Src) 98 F (36.7 C) (Oral)  Resp 19  Ht 5\' 5"  (1.651 m)  Wt 220 lb (99.791 kg)  BMI  36.61 kg/m2  SpO2 98%  LMP 09/09/2014  Breastfeeding? No Physical Exam  Nursing note and vitals reviewed. Constitutional: She is oriented to person, place, and time. She appears well-developed and well-nourished. No distress.  HENT:  Head: Normocephalic and atraumatic.  Eyes: Conjunctivae and EOM are normal.  Neck: Normal range of motion.  Cardiovascular: Normal rate, regular rhythm and intact distal pulses.  Exam reveals no gallop and no friction rub.   No murmur heard. Pulmonary/Chest: Effort normal and breath sounds normal. She has no wheezes. She has  no rales. She exhibits no tenderness.  Abdominal: Soft. She exhibits no distension. There is no tenderness. There is no rebound.  Musculoskeletal:  Obvious deformity of right proximal ankle. Limited ROM of right ankle due to pain and deformity.   Neurological: She is alert and oriented to person, place, and time. Coordination normal.  Sensation intact of bilateral feet. Speech is goal-oriented. Moves limbs without ataxia.   Skin: Skin is warm and dry.  Psychiatric: She has a normal mood and affect. Her behavior is normal.    ED Course  Procedures (including critical care time) Labs Review Labs Reviewed  BASIC METABOLIC PANEL - Abnormal; Notable for the following:    Glucose, Bld 137 (*)    All other components within normal limits  CBC  POC URINE PREG, ED    Imaging Review Dg Ankle 2 Views Right  10/07/2014   CLINICAL DATA:  Trip and fall injury. Pain, swelling, decreased range of motion in the right ankle.  EXAM: RIGHT ANKLE - 2 VIEW  COMPARISON:  None.  FINDINGS: Displaced trimalleolar fractures of the right ankle. There is an oblique comminuted fracture of the lateral malleolus with superior and medial displacement of distal fracture fragments. There is a transverse fracture of the medial malleolus. Comminuted fractures of the posterior malleolus. Posterior dislocation of the talus with respect to the tibia associated with posterior dislocation of the posterior and medial malleolar fragments. Associated soft tissue swelling.  IMPRESSION: Displaced trimalleolar fractures of the right ankle as described.   Electronically Signed   By: Lucienne Capers M.D.   On: 10/07/2014 00:36     EKG Interpretation None      MDM   Final diagnoses:  Ankle pain, right   12:01 AM Patient will have morphine and zofran for pain. Right ankle xray pending. Vitals stable and patient afebrile. No neurovascular compromise.   2:26 AM Patient has a displaced trimalleolar fracture of right ankle. Dr.  Vanita Panda spoke with Dr. Marlou Sa who will admit the patient.    Alvina Chou, PA-C 10/07/14 7902   This was a shared visit with a mid-level provided (NP or PA).  Throughout the patient's course I was available for consultation/collaboration.  I saw the ECG (if appropriate), relevant labs and studies - I agree with the interpretation.  On my exam the patient was in no distress.  Patient has appreciable pulses in the distal extremity, could move the toes and spontaneously.  I demonstrated the x-ray to the patient and her husband.  I agree with the interpretation.  Subsequently I discussed the patient's case with our orthopedist, Dr. Marlou Sa.  With Dr. Marlou Sa we performed conscious sedation, reduction of the ankle with anticipation of surgery later today. Prior to the sedation I discussed risks, benefits with patient and her husband, who agree with the procedure. Patient tolerated the procedure well, was admitted for further evaluation and management.   Conscious sedation procedure note Indication reduction of fracture dislocation of  the right ankle. Consent written, verbal, husband and wife. Time out performed Following assessment of the airway, patient was placed on monitors, had oxygen applied, and resuscitative measures were ready. Patient received 0.1 mg per kilo of etomidate, 10 mg. Patient had approximately 15 minutes of decreased responsiveness.  I was present throughout the entirety of the procedure.  Please see nursing notes for the timing of the medication. During sedation, patient had successful erection of her ankle fracture, dislocation. Patient had splinting of the extremity. This was all well tolerated. No complications.      Carmin Muskrat, MD 10/07/14 [phone]

## 2014-10-07 NOTE — Progress Notes (Signed)
Pt has prescriptions for dc sunday

## 2014-10-07 NOTE — Sedation Documentation (Signed)
Sedation consent signed by pt after procedure explained by Dr. Marlou Sa

## 2014-10-07 NOTE — Op Note (Signed)
NAME:  Rhonda Adams, Rhonda Adams NO.:  [id number]  MEDICAL RECORD NO.:  [id number]  LOCATION:  [facility]                         FACILITY:  [facility]  PHYSICIAN:  Anderson Malta, M.D.    DATE OF BIRTH:  1976/05/03  DATE OF PROCEDURE: DATE OF DISCHARGE:                              OPERATIVE REPORT   PREOPERATIVE DIAGNOSIS:  Right ankle trimalleolar ankle fracture.  POSTOPERATIVE DIAGNOSIS:  Right ankle trimalleolar ankle fracture.  PROCEDURE:  Right ankle trimalleolar ankle fracture open reduction and internal fixation of the medial and lateral malleoli.  SURGEON:  Anderson Malta, M.D.  ASSISTANT:  None.  ANESTHESIA:  General.  INDICATIONS:  [name] is the patient with right ankle fracture, unstable, required reduction in the emergency room, presents now for operative management after explanation of risks and benefits.  PROCEDURE IN DETAIL:  The patient was brought to operating room where general endotracheal anesthesia was induced.  Preoperative antibiotics administered.  Time-out was called.  Right leg was prescrubbed with alcohol and Betadine, allowed to air dry, prepped with DuraPrep solution, draped in sterile manner.  Charlie Pitter was used to cover the operative field.  Time-out was called.  Leg was elevated and exsanguinated with Esmarch wrap for calf tourniquet approximately 45 minutes.  Lateral incision was made over the lateral malleolus.  Skin and subcutaneous tissue were sharply divided.  Care was taken to avoid injury to superficial peroneal nerve.  The fracture was reduced and then placed a lag screw.  Three 5 anterior proximal to posterior distal. This gave good reduction of the fracture.  A 5-hole Smith and Nephew lateral malleolar plate was then applied.  Good fixation was achieved. Fixation was confirmed in the AP and lateral planes under fluoroscopy. At this time, attention was directed toward the medial side.  An incision was made over the medial malleolus.  Skin  and subcu tissue sharply divided.  Care was taken to avoid injury to the saphenous nerve. At this time, two screws were placed across the fracture which was reduced anatomically, good reduction was achieved.  This was a 4-0 cannulated partially threaded cancellous screws from [employer]. At this time, thorough irrigation was performed on both incisions, mortise was stable to stress.  Syndesmosis was stable.  Incision was then closed on both sides after thorough irrigation, and bleeding points controlled using electrocautery.  Following that, the incision was closed using a combination of 2-0 and 3-0 Vicryl and 3-0 nylon.  Well- padded posterior splint applied.  The patient tolerated the procedure well without immediate complication, transferred to recovery room in stable condition.     Anderson Malta, M.D.     GSD/MEDQ  D:  10/07/2014  T:  10/07/2014  Job:  [id number]

## 2014-10-07 NOTE — Brief Op Note (Signed)
10/06/2014 - 10/07/2014  3:51 PM  PATIENT:  Rhonda Adams  38 y.o. female  PRE-OPERATIVE DIAGNOSIS:  right ankle fracture dislocation  POST-OPERATIVE DIAGNOSIS:  right ankle fracture dislocation  PROCEDURE:  Procedure(s): OPEN REDUCTION INTERNAL FIXATION (ORIF) ANKLE FRACTURE  SURGEON:  Surgeon(s): Meredith Pel, MD  ASSISTANT: none  ANESTHESIA:   general  EBL: 30 ml    Total I/O In: 2300 [I.V.:2300] Out: -   BLOOD ADMINISTERED: none  DRAINS: none   LOCAL MEDICATIONS USED:  none  SPECIMEN:  No Specimen  COUNTS:  YES  TOURNIQUET:    DICTATION: .Other Dictation: Dictation Number [id number]   PLAN OF CARE: Admit for overnight observation  PATIENT DISPOSITION:  PACU - hemodynamically stable

## 2014-10-07 NOTE — Transfer of Care (Signed)
Immediate Anesthesia Transfer of Care Note  Patient: Rhonda Adams  Procedure(s) Performed: Procedure(s): OPEN REDUCTION INTERNAL FIXATION (ORIF) ANKLE FRACTURE (Right)  Patient Location: PACU  Anesthesia Type:General  Level of Consciousness: awake  Airway & Oxygen Therapy: Patient Spontanous Breathing and Patient connected to nasal cannula oxygen  Post-op Assessment: Report given to PACU RN and Post -op Vital signs reviewed and stable  Post vital signs: Reviewed and stable  Complications: No apparent anesthesia complications

## 2014-10-07 NOTE — Anesthesia Procedure Notes (Addendum)
Procedure Name: Intubation Date/Time: 10/07/2014 1:52 PM Performed by: Blair Heys E Pre-anesthesia Checklist: Patient identified, Emergency Drugs available, Suction available and Patient being monitored Patient Re-evaluated:Patient Re-evaluated prior to inductionOxygen Delivery Method: Circle system utilized Preoxygenation: Pre-oxygenation with 100% oxygen Intubation Type: IV induction Ventilation: Mask ventilation without difficulty Laryngoscope Size: Miller and 2 Grade View: Grade I Tube type: Oral Tube size: 7.5 mm Number of attempts: 1 Airway Equipment and Method: Stylet Placement Confirmation: ETT inserted through vocal cords under direct vision,  positive ETCO2 and breath sounds checked- equal and bilateral Secured at: 20 cm Tube secured with: Tape Dental Injury: Teeth and Oropharynx as per pre-operative assessment     Anesthesia Regional Block:  Popliteal block  Pre-Anesthetic Checklist: ,, timeout performed, Correct Patient, Correct Site, Correct Laterality, Correct Procedure, Correct Position, site marked, Risks and benefits discussed,  Surgical consent,  Pre-op evaluation,  At surgeon's request and post-op pain management  Laterality: Right  Prep: chloraprep       Needles:  Injection technique: Single-shot  Needle Type: Echogenic Stimulator Needle     Needle Length: 10cm 10 cm Needle Gauge: 21 and 21 G    Additional Needles:  Procedures: ultrasound guided (picture in chart) and nerve stimulator Popliteal block  Nerve Stimulator or Paresthesia:  Response: 0.4 mA,   Additional Responses:   Narrative:  Start time: 10/07/2014 1:15 PM End time: 10/07/2014 1:35 PM Injection made incrementally with aspirations every 5 mL.  Performed by: Personally  Anesthesiologist: Lillia Abed MD  Additional Notes: Monitors applied. Patient sedated. Sterile prep and drape,hand hygiene and sterile gloves were used. Relevant anatomy identified.Needle position  confirmed.Local anesthetic injected incrementally after negative aspiration. Local anesthetic spread visualized around nerve(s). Vascular puncture avoided. No complications. Image printed for medical record.The patient tolerated the procedure well.  Additional Saphenous nerve block performed. 15cc Local Anesthetic mixture placed under ultrasonic guidance along the medio-inferior border of the Sartorious muscle 6 inches above the knee.  No Problems encountered.  Lillia Abed MD

## 2014-10-07 NOTE — Anesthesia Preprocedure Evaluation (Addendum)
Anesthesia Evaluation  Patient identified by MRN, date of birth, ID band Patient awake    Reviewed: Allergy & Precautions, H&P , NPO status , Patient's Chart, lab work & pertinent test results  Airway Mallampati: II TM Distance: >3 FB Neck ROM: Full    Dental  (+) Dental Advisory Given, Teeth Intact,    Pulmonary          Cardiovascular     Neuro/Psych  Headaches,    GI/Hepatic   Endo/Other  Morbid obesity  Renal/GU      Musculoskeletal   Abdominal   Peds  Hematology   Anesthesia Other Findings   Reproductive/Obstetrics                         Anesthesia Physical Anesthesia Plan  ASA: II  Anesthesia Plan: General   Post-op Pain Management:    Induction: Intravenous  Airway Management Planned: Oral ETT  Additional Equipment:   Intra-op Plan:   Post-operative Plan: Extubation in OR  Informed Consent: I have reviewed the patients History and Physical, chart, labs and discussed the procedure including the risks, benefits and alternatives for the proposed anesthesia with the patient or authorized representative who has indicated his/her understanding and acceptance.   Dental advisory given  Plan Discussed with: CRNA, Anesthesiologist and Surgeon  Anesthesia Plan Comments:         Anesthesia Quick Evaluation

## 2014-10-07 NOTE — ED Notes (Signed)
Dr.Lockwood at bedside. Pt returned from xray with increased swelling to upper leg. Pulses present

## 2014-10-07 NOTE — Sedation Documentation (Signed)
Right lower leg dislocation reduced by Dr. Marlou Sa.  Pt tolerated well.

## 2014-10-07 NOTE — H&P (Signed)
Rhonda Adams is an 38 y.o. female.   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 She developed immediate onset of pain. There is no loss of consciousness. Denies any other orthopedic complaints. No family history of pulmonary embolism or DVT. Was planning on going to [employer] in a few hours with her daughter and husband.  Past Medical History  Diagnosis Date  . Ovarian cyst   . Migraine   . Preterm labor 2012    Past Surgical History  Procedure Laterality Date  . Dilation and curettage of uterus    . Miscarriage    . Arthroscopic repair acl      left  . Dilation and evacuation N/A 09/04/2013    Procedure: exam under anesthesia placement Bakri balloon;  Surgeon: Woodroe Mode, MD;  Location: [facility];  Service: Gynecology;  Laterality: N/A;    Family History  Problem Relation Age of Onset  . Cancer Mother 14    BREAST  . Hypertension Father   . Heart disease Maternal Grandmother   . Diabetes Maternal Grandfather   . Stroke Paternal Grandfather   . Hearing loss Neg Hx    Social History:  reports that she has never smoked. She has never used smokeless tobacco. She reports that she drinks alcohol. She reports that she does not use illicit drugs.  Allergies:  Allergies  Allergen Reactions  . Berniece Salines Flavor Nausea And Vomiting    Pt states she is allergic to bacon; not bacon flavor.     (Not in a hospital admission)  Results for orders placed during the hospital encounter of 10/06/14 (from the past 48 hour(s))  CBC     Status: None   Collection Time    10/06/14 11:35 PM      Result Value Ref Range   WBC 8.0  4.0 - 10.5 K/uL   RBC 4.58  3.87 - 5.11 MIL/uL   Hemoglobin 13.4  12.0 - 15.0 g/dL   HCT 41.0  36.0 - 46.0 %   MCV 89.5  78.0 - 100.0 fL   MCH 29.3  26.0 - 34.0 pg   MCHC 32.7  30.0 - 36.0 g/dL   RDW 13.5  11.5 - 15.5 %   Platelets  200  150 - 400 K/uL  BASIC METABOLIC PANEL     Status: Abnormal   Collection Time    10/06/14 11:35 PM      Result Value Ref Range   Sodium 138  137 - 147 mEq/L   Potassium 3.7  3.7 - 5.3 mEq/L   Chloride 105  96 - 112 mEq/L   CO2 22  19 - 32 mEq/L   Glucose, Bld 137 (*) 70 - 99 mg/dL   BUN 16  6 - 23 mg/dL   Creatinine, Ser 0.66  0.50 - 1.10 mg/dL   Calcium 8.5  8.4 - 10.5 mg/dL   GFR calc non Af Amer >90  >90 mL/min   GFR calc Af Amer >90  >90 mL/min   Comment: (NOTE)     The eGFR has been calculated using the CKD EPI equation.     This calculation has not been validated in all clinical situations.     eGFR's persistently <90 mL/min signify possible Chronic Kidney     Disease.   Anion gap 11  5 - 15  POC URINE  PREG, ED     Status: None   Collection Time    10/07/14  1:21 AM      Result Value Ref Range   Preg Test, Ur NEGATIVE  NEGATIVE   Comment:            THE SENSITIVITY OF THIS     METHODOLOGY IS >24 mIU/mL   Dg Ankle 2 Views Right  10/07/2014   CLINICAL DATA:  Trip and fall injury. Pain, swelling, decreased range of motion in the right ankle.  EXAM: RIGHT ANKLE - 2 VIEW  COMPARISON:  None.  FINDINGS: Displaced trimalleolar fractures of the right ankle. There is an oblique comminuted fracture of the lateral malleolus with superior and medial displacement of distal fracture fragments. There is a transverse fracture of the medial malleolus. Comminuted fractures of the posterior malleolus. Posterior dislocation of the talus with respect to the tibia associated with posterior dislocation of the posterior and medial malleolar fragments. Associated soft tissue swelling.  IMPRESSION: Displaced trimalleolar fractures of the right ankle as described.   Electronically Signed   By: Lucienne Capers M.D.   On: 10/07/2014 00:36    Review of Systems  Constitutional: Negative.   HENT: Negative.   Eyes: Negative.   Respiratory: Negative.   Cardiovascular: Negative.   Gastrointestinal:  Negative.   Genitourinary: Negative.   Musculoskeletal: Positive for joint pain.  Skin: Negative.   Neurological: Negative.   Endo/Heme/Allergies: Negative.   Psychiatric/Behavioral: Negative.     Blood pressure 146/77, pulse 113, temperature 98 F (36.7 C), temperature source Oral, resp. rate 21, height _0  (1.651 m), weight 99.791 kg (220 lb), last menstrual period 09/09/2014, SpO2 98.00%, not currently breastfeeding. Physical Exam  Constitutional: She appears well-developed.  HENT:  Head: Normocephalic.  Eyes: Pupils are equal, round, and reactive to light.  Neck: Normal range of motion.  Cardiovascular: Normal rate.   Respiratory: Effort normal.  Neurological: She is alert.  Skin: Skin is warm.  Psychiatric: She has a normal mood and affect.   bilateral upper extremities have full range of motion wrists elbows shoulders she has a left knee full range of motion left ankle full range of motion right ankle has swelling pedal pulses palpable ankle dorsiflexion plantar flexion intact but painful she has no paresthesias on the dorsum plantar aspect of the right foot no effusion in the right knee  Assessment/Plan Impression is right ankle fracture dislocation plan is for reduction with conscious sedation here in the emergency room details is posteriorly subluxate in relation to the tibia later today we will plan for operative fixation of the trimalleolar fracture which will include plate fixation laterally possible anterior posterior screw fixation of the posterior fragment as well as medial malleolar fixation. Risk and benefits discussed with the patient and her husband including not limited to infection ankle stiffness arthritis prolonged period of nonweightbearing in diminished weight bearing necessity for elevation postop also discussed all questions answered we'll plan for conscious sedation the emergency room and surgical fixation later today.  Marcell Chavarin SCOTT 10/07/2014, 1:42  AM

## 2014-10-08 MED ORDER — OXYCODONE-ACETAMINOPHEN 5-325 MG PO TABS: 1.0000 | ORAL_TABLET | ORAL | Status: DC | PRN

## 2014-10-08 NOTE — Progress Notes (Signed)
Patient ID: Rhonda Adams, female   DOB: 02-20-1976, 38 y.o.   MRN: [id number] Anticipate d/c to home after PT

## 2014-10-08 NOTE — Progress Notes (Signed)
IV removed per order. Compression wrap in place with no drainage showing. Crutches delivered. PT worked with patient on crutch walking. Pharmacist up to talk to patient about Xarelto which she is going home on. Discharge instructions given to patient with good teachback. Discharged via wheelchair with Right Leg elevated to husband's care with NT present. Melford Aase, RN

## 2014-10-08 NOTE — Care Management Note (Signed)
    Page 1 of 1   10/08/2014     3:27:26 PM CARE MANAGEMENT NOTE 10/08/2014  Patient:  Rhonda Adams   Account Number:  [id number]  Date Initiated:  10/08/2014  Documentation initiated by:  [facility]  Subjective/Objective Assessment:   adm: ankle fracture, right     Action/Plan:   discharge planning   Anticipated DC Date:  10/08/2014   Anticipated DC Plan:  [place]  CM consult      Choice offered to / List presented to:     DME arranged  OTHER - SEE COMMENT      DME agency  [place].        Status of service:   Medicare Important Message given?   (If response is "NO", the following Medicare IM given date fields will be blank) Date Medicare IM given:   Medicare IM given by:   Date Additional Medicare IM given:   Additional Medicare IM given by:    Discharge Disposition:  HOME/SELF CARE  Per UR Regulation:    If discussed at Long Length of Stay Meetings, dates discussed:    Comments:  10/08/14 15:20 CM received call for a knee walker for pt. Cm called DME rep, Jeneen Rinks who states he cannot deliver this and the pt's family will have to pick it up at the [facility] address in the morning when it opens at 09:00. CM put address and message of this on the pt's AVS.  CM will fax the order and facesheet to [facility] so knee walker will be ready for pickup by husband of pt in the morning.  CM called RN to make aware.  No other 'CM needs were communicated.  Mariane Masters, BSN, CM [phone].

## 2014-10-08 NOTE — Discharge Summary (Signed)
Physician Discharge Summary  Patient ID: Rhonda Adams MRN: [id number] DOB/AGE: November 26, 1976 38 y.o.  Admit date: 10/06/2014 Discharge date: 10/08/2014  Admission Diagnoses:ankle fracture, right  Discharge Diagnoses:  Active Problems:   Ankle fracture   Discharged Condition: stable  Hospital Course: ORIF right ankle  Consults: None  Significant Diagnostic Studies: labs: routine  Treatments: surgery: see op note  Discharge Exam: Blood pressure 115/61, pulse 61, temperature 98 F (36.7 C), temperature source Oral, resp. rate 16, height 5\' 5"  (1.651 m), weight 99.791 kg (220 lb), last menstrual period 09/09/2014, SpO2 96.00%, not currently breastfeeding. Incision/Wound:dry  Disposition: 01-Home or Self Care  Discharge Instructions   Call MD / Call 911    Complete by:  As directed   If you experience chest pain or shortness of breath, CALL 911 and be transported to the hospital emergency room.  If you develope a fever above 101 F, pus (white drainage) or increased drainage or redness at the wound, or calf pain, call your surgeon's office.     Call MD / Call 911    Complete by:  As directed   If you experience chest pain or shortness of breath, CALL 911 and be transported to the hospital emergency room.  If you develope a fever above 101 F, pus (white drainage) or increased drainage or redness at the wound, or calf pain, call your surgeon's office.     Constipation Prevention    Complete by:  As directed   Drink plenty of fluids.  Prune juice may be helpful.  You may use a stool softener, such as Colace (over the counter) 100 mg twice a day.  Use MiraLax (over the counter) for constipation as needed.     Constipation Prevention    Complete by:  As directed   Drink plenty of fluids.  Prune juice may be helpful.  You may use a stool softener, such as Colace (over the counter) 100 mg twice a day.  Use MiraLax (over the counter) for constipation as needed.     Diet - low sodium heart  healthy    Complete by:  As directed      Diet - low sodium heart healthy    Complete by:  As directed      Discharge instructions    Complete by:  As directed   Non weight bearing right leg Elevate right leg Keep splint dry     Elevate operative extremity    Complete by:  As directed      For home use only DME Crutches    Complete by:  As directed      Increase activity slowly as tolerated    Complete by:  As directed      Increase activity slowly as tolerated    Complete by:  As directed      Non weight bearing    Complete by:  As directed   Laterality:  right  Extremity:  Lower            Medication List    STOP taking these medications       aspirin 81 MG chewable tablet     ibuprofen 600 MG tablet  Commonly known as:  ADVIL,MOTRIN     Norethindrone Acetate-Ethinyl Estrad-FE 1-20 MG-MCG(24) tablet  Commonly known as:  LOESTRIN 24 FE      TAKE these medications       aspirin-acetaminophen-caffeine 297-989-21 MG per tablet  Commonly known as:  EXCEDRIN MIGRAINE  Take 1  tablet by mouth every 6 (six) hours as needed for headache.     methocarbamol 500 MG tablet  Commonly known as:  ROBAXIN  Take 1 tablet (500 mg total) by mouth 4 (four) times daily.     multivitamin with minerals tablet  Take 1 tablet by mouth daily.     oxyCODONE-acetaminophen 10-325 MG per tablet  Commonly known as:  PERCOCET  Take 1-2 tablets by mouth every 6 (six) hours as needed for pain.     oxyCODONE-acetaminophen 5-325 MG per tablet  Commonly known as:  ROXICET  Take 1 tablet by mouth every 4 (four) hours as needed for severe pain.     rivaroxaban 10 MG Tabs tablet  Commonly known as:  XARELTO  Take 1 tablet (10 mg total) by mouth daily.     rivaroxaban 10 MG Tabs tablet  Commonly known as:  XARELTO  Take 1 tablet (10 mg total) by mouth daily.           Follow-up Information   Follow up with Meredith Pel, MD In 1 week.   Specialty:  Orthopedic Surgery   Contact  information:   [place] [address] [phone]       Signed: Newt Minion 10/08/2014, 9:07 AM

## 2014-10-08 NOTE — Evaluation (Signed)
Physical Therapy Evaluation Patient Details Name: Rhonda Adams MRN: [id number] DOB: 07-Jan-1976 Today's Date: 10/08/2014   History of Present Illness  s/p ORIF right ankle s/p ankle fx of unknown cause (no fall)  Clinical Impression  Patient did well with mobility with crutches and knee walker.  Do feel patient will need both.  Patient intends to return to work and will benefit from knee walker for longer distances.  Patient and husband instructed up/down stairs.  Feel patient is safe for d/c from PT standpoint.      Follow Up Recommendations No PT follow up    Equipment Recommendations  Crutches (knee walker)    Recommendations for Other Services       Precautions / Restrictions Precautions Precautions: None Restrictions RLE Weight Bearing: Non weight bearing      Mobility  Bed Mobility Overal bed mobility: Independent                Transfers Overall transfer level: Needs assistance Equipment used: Crutches Transfers: Sit to/from Stand Sit to Stand: Supervision         General transfer comment: supervision for safety  Ambulation/Gait Ambulation/Gait assistance: Supervision Ambulation Distance (Feet): 40 Feet Assistive device: Crutches Gait Pattern/deviations: Step-to pattern     General Gait Details: also tried knee walker, patient supervision for safety transfer on/off and locomoting with knee walker.  Stairs Stairs: Yes Stairs assistance: Min assist Stair Management: One rail Right Number of Stairs: 2 General stair comments: min assist to assist with balance on stairs  Wheelchair Mobility    Modified Rankin (Stroke Patients Only)       Balance Overall balance assessment: Needs assistance Sitting-balance support: No upper extremity supported Sitting balance-Leahy Scale: Normal     Standing balance support: Bilateral upper extremity supported Standing balance-Leahy Scale: Poor Standing balance comment: needed crutches for balance due  to NWB                             Pertinent Vitals/Pain Pain Assessment: 0-10 Pain Score: 4  Pain Descriptors / Indicators: Aching Pain Intervention(s): Monitored during session    Home Living Family/patient expects to be discharged to:: Private residence Living Arrangements: Spouse/significant other;Children Available Help at Discharge: Family;Available PRN/intermittently Type of Home: House Home Access: Stairs to enter Entrance Stairs-Rails: Psychiatric nurse of Steps: 4 Home Layout: Two level Home Equipment: None      Prior Function Level of Independence: Independent               Hand Dominance        Extremity/Trunk Assessment   Upper Extremity Assessment: Overall WFL for tasks assessed           Lower Extremity Assessment: Overall WFL for tasks assessed         Communication   Communication: No difficulties  Cognition Arousal/Alertness: Awake/alert Behavior During Therapy: WFL for tasks assessed/performed Overall Cognitive Status: Within Functional Limits for tasks assessed                      General Comments      Exercises        Assessment/Plan    PT Assessment Patent does not need any further PT services  PT Diagnosis Difficulty walking   PT Problem List    PT Treatment Interventions     PT Goals (Current goals can be found in the Care Plan section) Acute Rehab PT Goals Patient  Stated Goal: go home PT Goal Formulation: All assessment and education complete, DC therapy    Frequency     Barriers to discharge        Co-evaluation               End of Session Equipment Utilized During Treatment: Gait belt Activity Tolerance: Patient tolerated treatment well Patient left: in chair;with family/visitor present Nurse Communication: Mobility status         Time: 8280-0349 PT Time Calculation (min): 36 min   Charges:   PT Evaluation $Initial PT Evaluation Tier I: 1  Procedure PT Treatments $Gait Training: 8-22 mins   PT G CodesShanna Cisco 10/08/2014, 2:23 PM 10/08/2014 [place], [place]

## 2014-10-08 NOTE — Discharge Instructions (Signed)
Information on my medicine - XARELTO (Rivaroxaban)  This medication education was reviewed with me or my healthcare representative as part of my discharge preparation.  The pharmacist that spoke with me during my hospital stay was:  Horatio Pel, RPH  Why was Xarelto prescribed for you? Xarelto was prescribed for you to reduce the risk of blood clots forming after orthopedic surgery. The medical term for these abnormal blood clots is venous thromboembolism (VTE).  What do you need to know about xarelto ? Take your Xarelto ONCE DAILY at the same time every day. You may take it either with or without food.  If you have difficulty swallowing the tablet whole, you may crush it and mix in applesauce just prior to taking your dose.  Take Xarelto exactly as prescribed by your doctor and DO NOT stop taking Xarelto without talking to the doctor who prescribed the medication.  Stopping without other VTE prevention medication to take the place of Xarelto may increase your risk of developing a clot.  After discharge, you should have regular check-up appointments with your healthcare provider that is prescribing your Xarelto.    What do you do if you miss a dose? If you miss a dose, take it as soon as you remember on the same day then continue your regularly scheduled once daily regimen the next day. Do not take two doses of Xarelto on the same day.   Important Safety Information A possible side effect of Xarelto is bleeding. You should call your healthcare provider right away if you experience any of the following:   Bleeding from an injury or your nose that does not stop.   Unusual colored urine (red or dark brown) or unusual colored stools (red or black).   Unusual bruising for unknown reasons.   A serious fall or if you hit your head (even if there is no bleeding).  Some medicines may interact with Xarelto and might increase your risk of bleeding while on Xarelto. To help avoid  this, consult your healthcare provider or pharmacist prior to using any new prescription or non-prescription medications, including herbals, vitamins, non-steroidal anti-inflammatory drugs (NSAIDs) and supplements.  This website has more information on Xarelto: [email or web address].[email or web address]

## 2014-10-08 NOTE — Anesthesia Postprocedure Evaluation (Signed)
Anesthesia Post Note  Patient: Rhonda Adams  Procedure(s) Performed: Procedure(s) (LRB): OPEN REDUCTION INTERNAL FIXATION (ORIF) ANKLE FRACTURE (Right)  Anesthesia type: general  Patient location: PACU  Post pain: Pain level controlled  Post assessment: Patient's Cardiovascular Status Stable  Last Vitals:  Filed Vitals:   10/08/14 1506  BP: 122/59  Pulse: 85  Temp: 36.8 C  Resp: 16    Post vital signs: Reviewed and stable  Level of consciousness: sedated  Complications: No apparent anesthesia complications

## 2014-10-09 NOTE — Progress Notes (Signed)
Utilization review completed.  

## 2014-10-10 ENCOUNTER — Encounter (HOSPITAL_COMMUNITY): Payer: Self-pay | Admitting: Orthopedic Surgery

## 2014-10-16 ENCOUNTER — Encounter (HOSPITAL_COMMUNITY): Payer: Self-pay | Admitting: Orthopedic Surgery

## 2014-10-20 ENCOUNTER — Inpatient Hospital Stay (HOSPITAL_COMMUNITY)
Admission: RE | Admit: 2014-10-20 | Payer: BC Managed Care – PPO | Source: Ambulatory Visit | Admitting: Obstetrics & Gynecology

## 2014-10-20 ENCOUNTER — Encounter (HOSPITAL_COMMUNITY): Admission: RE | Payer: Self-pay | Source: Ambulatory Visit

## 2014-10-20 SURGERY — SALPINGECTOMY, BILATERAL, LAPAROSCOPIC
Anesthesia: Choice | Laterality: Bilateral

## 2014-11-25 ENCOUNTER — Emergency Department (HOSPITAL_COMMUNITY)
Admission: EM | Admit: 2014-11-25 | Discharge: 2014-11-25 | Disposition: A | Discharge status: Home / Self Care | Payer: BC Managed Care – PPO | Source: Home / Self Care | Attending: Family Medicine | Admitting: Family Medicine

## 2014-11-25 ENCOUNTER — Encounter (HOSPITAL_COMMUNITY): Payer: Self-pay | Admitting: Emergency Medicine

## 2014-11-25 DIAGNOSIS — N390 Urinary tract infection, site not specified: Secondary | ICD-10-CM

## 2014-11-25 LAB — POCT URINALYSIS DIP (DEVICE)
Glucose, UA: NEGATIVE mg/dL
Nitrite: POSITIVE — AB
Specific Gravity, Urine: >=1.03 (ref 1.005–1.030)
UROBILINOGEN UA: 1 mg/dL (ref 0.0–1.0)
pH: 6 (ref 5.0–8.0)

## 2014-11-25 LAB — POCT PREGNANCY, URINE: PREG TEST UR: NEGATIVE

## 2014-11-25 MED ORDER — CEPHALEXIN 500 MG PO CAPS: 500.0000 mg | ORAL_CAPSULE | Freq: Four times a day (QID) | ORAL | Status: DC

## 2014-11-25 NOTE — ED Notes (Signed)
Pt states that she has urinary frequency since this morning. Pt states that she saw blood in her urine about 1130 this morning.

## 2014-11-25 NOTE — Discharge Instructions (Signed)
Take all of medicine as directed, drink lots of fluids, see your doctor if further problems. °

## 2014-11-25 NOTE — ED Provider Notes (Signed)
CSN: [id number]     Arrival date & time 11/25/14  1446 History   First MD Initiated Contact with Patient 11/25/14 1522     Chief Complaint  Patient presents with  . Urinary Frequency   (Consider location/radiation/quality/duration/timing/severity/associated sxs/prior Treatment) Patient is a 38 y.o. female presenting with frequency. The history is provided by the patient.  Urinary Frequency This is a new problem. The current episode started 6 to 12 hours ago. The problem has been gradually worsening. Pertinent negatives include no abdominal pain.    Past Medical History  Diagnosis Date  . Ovarian cyst   . Migraine   . Preterm labor 2012   Past Surgical History  Procedure Laterality Date  . Dilation and curettage of uterus    . Miscarriage    . Arthroscopic repair acl      left  . Dilation and evacuation N/A 09/04/2013    Procedure: exam under anesthesia placement Bakri balloon;  Surgeon: Woodroe Mode, MD;  Location: [facility];  Service: Gynecology;  Laterality: N/A;  . Orif ankle fracture Right 10/07/2014    Procedure: OPEN REDUCTION INTERNAL FIXATION (ORIF) ANKLE FRACTURE;  Surgeon: Meredith Pel, MD;  Location: [facility];  Service: Orthopedics;  Laterality: Right;   Family History  Problem Relation Age of Onset  . Cancer Mother 16    BREAST  . Hypertension Father   . Heart disease Maternal Grandmother   . Diabetes Maternal Grandfather   . Stroke Paternal Grandfather   . Hearing loss Neg Hx    History  Substance Use Topics  . Smoking status: Never Smoker   . Smokeless tobacco: Never Used  . Alcohol Use: Yes     Comment: rare, not with preg   OB History    Gravida Para Term Preterm AB TAB SAB Ectopic Multiple Living   6 2 0 2 4 0 4 0 0 2      Review of Systems  Constitutional: Negative for fever and chills.  Gastrointestinal: Negative.  Negative for abdominal pain.  Genitourinary: Positive for dysuria, urgency, frequency and hematuria. Negative for flank pain,  vaginal bleeding, vaginal discharge, menstrual problem and pelvic pain.    Allergies  Bacon flavor  Home Medications   Prior to Admission medications   Medication Sig Start Date End Date Taking? Authorizing Provider  aspirin-acetaminophen-caffeine (EXCEDRIN MIGRAINE) [phone] MG per tablet Take 1 tablet by mouth every 6 (six) hours as needed for headache.    Historical Provider, MD  cephALEXin (KEFLEX) 500 MG capsule Take 1 capsule (500 mg total) by mouth 4 (four) times daily. Take all of medicine and drink lots of fluids 11/25/14   Billy Fischer, MD  methocarbamol (ROBAXIN) 500 MG tablet Take 1 tablet (500 mg total) by mouth 4 (four) times daily. 10/07/14   Meredith Pel, MD  Multiple Vitamins-Minerals (MULTIVITAMIN WITH MINERALS) tablet Take 1 tablet by mouth daily.    Historical Provider, MD  oxyCODONE-acetaminophen (PERCOCET) 10-325 MG per tablet Take 1-2 tablets by mouth every 6 (six) hours as needed for pain. 10/07/14   Meredith Pel, MD  oxyCODONE-acetaminophen (ROXICET) 5-325 MG per tablet Take 1 tablet by mouth every 4 (four) hours as needed for severe pain. 10/08/14   Newt Minion, MD  rivaroxaban (XARELTO) 10 MG TABS tablet Take 1 tablet (10 mg total) by mouth daily. 10/07/14   Meredith Pel, MD  rivaroxaban (XARELTO) 10 MG TABS tablet Take 1 tablet (10 mg total) by mouth daily. 10/07/14  Meredith Pel, MD   BP 135/83 mmHg  Pulse 114  Temp(Src) 98.2 F (36.8 C) (Oral)  Resp 16  SpO2 98%  LMP 11/05/2014 Physical Exam  Constitutional: She is oriented to person, place, and time. She appears well-developed and well-nourished. No distress.  Abdominal: Soft. Bowel sounds are normal. She exhibits no distension and no mass. There is tenderness in the suprapubic area and left lower quadrant. There is no rigidity, no rebound, no guarding, no CVA tenderness, no tenderness at McBurney's point and negative Murphy's sign.  Neurological: She is alert and oriented to  person, place, and time.  Skin: Skin is warm and dry.  Nursing note and vitals reviewed.   ED Course  Procedures (including critical care time) Labs Review Labs Reviewed  POCT URINALYSIS DIP (DEVICE) - Abnormal; Notable for the following:    Bilirubin Urine SMALL (*)    Ketones, ur TRACE (*)    Hgb urine dipstick LARGE (*)    Protein, ur >=300 (*)    Nitrite POSITIVE (*)    Leukocytes, UA TRACE (*)    All other components within normal limits  POC URINE PREG, ED  POCT PREGNANCY, URINE    Imaging Review No results found.   MDM   1. UTI (lower urinary tract infection)        Billy Fischer, MD 11/25/14 [phone]

## 2015-09-03 ENCOUNTER — Other Ambulatory Visit: Payer: Self-pay | Admitting: Obstetrics & Gynecology

## 2015-09-10 NOTE — Telephone Encounter (Signed)
Pt needs refill on birth control pill, sent rx to pharmacy.  Pt will call back to schedule annual exam.

## 2016-02-08 ENCOUNTER — Encounter (HOSPITAL_COMMUNITY): Payer: Self-pay | Admitting: *Deleted

## 2016-02-08 ENCOUNTER — Ambulatory Visit (INDEPENDENT_AMBULATORY_CARE_PROVIDER_SITE_OTHER): Payer: 59 | Admitting: Obstetrics & Gynecology

## 2016-02-08 ENCOUNTER — Encounter: Payer: Self-pay | Admitting: Obstetrics & Gynecology

## 2016-02-08 VITALS — BP 106/75 | HR 89 | Ht 65.0 in | Wt 210.0 lb

## 2016-02-08 DIAGNOSIS — Z3041 Encounter for surveillance of contraceptive pills: Secondary | ICD-10-CM

## 2016-02-08 DIAGNOSIS — Z1151 Encounter for screening for human papillomavirus (HPV): Secondary | ICD-10-CM | POA: Diagnosis not present

## 2016-02-08 DIAGNOSIS — Z124 Encounter for screening for malignant neoplasm of cervix: Secondary | ICD-10-CM

## 2016-02-08 DIAGNOSIS — Z01419 Encounter for gynecological examination (general) (routine) without abnormal findings: Secondary | ICD-10-CM

## 2016-02-08 MED ORDER — NORETHIN ACE-ETH ESTRAD-FE 1-20 MG-MCG(24) PO TABS: 1.0000 | ORAL_TABLET | Freq: Every day | ORAL | Status: DC

## 2016-02-08 NOTE — Progress Notes (Signed)
Subjective:    Rhonda Adams is a 40 y.o. MW P2 (42 and 52 yo kids)  female who presents for an annual exam. The patient has no complaints today. The patient is sexually active. GYN screening history: last pap: was normal. The patient wears seatbelts: yes. The patient participates in regular exercise: yes. Has the patient ever been transfused or tattooed?: yes, transfused when she had a AFE. The patient reports that there is not domestic violence in her life.   Menstrual History: OB History    Gravida Para Term Preterm AB TAB SAB Ectopic Multiple Living   6 2 0 2 4 0 4 0 0 2       Menarche age:  Patient's last menstrual period was 01/16/2016.    The following portions of the patient's history were reviewed and updated as appropriate: allergies, current medications, past family history, past medical history, past social history, past surgical history and problem list.  Review of Systems Pertinent items noted in HPI and remainder of comprehensive ROS otherwise negative.    Objective:    BP 106/75 mmHg  Pulse 89  Ht 5\' 5"  (1.651 m)  Wt 210 lb (95.255 kg)  BMI 34.95 kg/m2  LMP 01/16/2016  General Appearance:    Alert, cooperative, no distress, appears stated age  Head:    Normocephalic, without obvious abnormality, atraumatic  Eyes:    PERRL, conjunctiva/corneas clear, EOM's intact, fundi    benign, both eyes  Ears:    Normal TM's and external ear canals, both ears  Nose:   Nares normal, septum midline, mucosa normal, no drainage    or sinus tenderness  Throat:   Lips, mucosa, and tongue normal; teeth and gums normal  Neck:   Supple, symmetrical, trachea midline, no adenopathy;    thyroid:  no enlargement/tenderness/nodules; no carotid   bruit or JVD  Back:     Symmetric, no curvature, ROM normal, no CVA tenderness  Lungs:     Clear to auscultation bilaterally, respirations unlabored  Chest Wall:    No tenderness or deformity   Heart:    Regular rate and rhythm, S1 and S2 normal, no  murmur, rub   or gallop  Breast Exam:    No tenderness, masses, or nipple abnormality  Abdomen:     Soft, non-tender, bowel sounds active all four quadrants,    no masses, no organomegaly  Genitalia:    Normal female without lesion, discharge or tenderness, NSSA, NT, mobile, normal adnexal exam     Extremities:   Extremities normal, atraumatic, no cyanosis or edema  Pulses:   2+ and symmetric all extremities  Skin:   Skin color, texture, turgor normal, no rashes or lesions  Lymph nodes:   Cervical, supraclavicular, and axillary nodes normal  Neurologic:   CNII-XII intact, normal strength, sensation and reflexes    throughout  .    Assessment:    Healthy female exam.   Contraception- Continue OCPs until her l/s BS is done Plan:     Thin prep Pap smear. with cotesting

## 2016-02-08 NOTE — Progress Notes (Signed)
Interested in scheduling tubal ligation.

## 2016-02-08 NOTE — Addendum Note (Signed)
Addended by: Ricka Burdock on: 02/08/2016 11:09 AM   Modules accepted: Orders

## 2016-02-11 LAB — CYTOLOGY - PAP

## 2016-03-25 ENCOUNTER — Encounter: Payer: Self-pay | Admitting: Internal Medicine

## 2016-03-25 ENCOUNTER — Other Ambulatory Visit (INDEPENDENT_AMBULATORY_CARE_PROVIDER_SITE_OTHER): Payer: 59

## 2016-03-25 ENCOUNTER — Ambulatory Visit (INDEPENDENT_AMBULATORY_CARE_PROVIDER_SITE_OTHER): Payer: 59 | Admitting: Internal Medicine

## 2016-03-25 VITALS — BP 124/84 | HR 71 | Temp 98.2°F | Resp 16 | Ht 65.0 in | Wt 198.0 lb

## 2016-03-25 DIAGNOSIS — G43809 Other migraine, not intractable, without status migrainosus: Secondary | ICD-10-CM

## 2016-03-25 DIAGNOSIS — Z0000 Encounter for general adult medical examination without abnormal findings: Secondary | ICD-10-CM

## 2016-03-25 DIAGNOSIS — Z1589 Genetic susceptibility to other disease: Secondary | ICD-10-CM | POA: Insufficient documentation

## 2016-03-25 DIAGNOSIS — E7212 Methylenetetrahydrofolate reductase deficiency: Secondary | ICD-10-CM | POA: Insufficient documentation

## 2016-03-25 LAB — CBC WITH DIFFERENTIAL/PLATELET
Basophils Absolute: 0 10*3/uL (ref 0.0–0.1)
Basophils Relative: 0.3 % (ref 0.0–3.0)
EOS ABS: 0.1 10*3/uL (ref 0.0–0.7)
Eosinophils Relative: 1.8 % (ref 0.0–5.0)
HEMATOCRIT: 42.2 % (ref 36.0–46.0)
Hemoglobin: 14.2 g/dL (ref 12.0–15.0)
Lymphocytes Relative: 31.1 % (ref 12.0–46.0)
Lymphs Abs: 1.7 10*3/uL (ref 0.7–4.0)
MCHC: 33.6 g/dL (ref 30.0–36.0)
MCV: 85.8 fl (ref 78.0–100.0)
MONOS PCT: 5.1 % (ref 3.0–12.0)
Monocytes Absolute: 0.3 10*3/uL (ref 0.1–1.0)
NEUTROS ABS: 3.5 10*3/uL (ref 1.4–7.7)
Neutrophils Relative %: 61.7 % (ref 43.0–77.0)
PLATELETS: 231 10*3/uL (ref 150.0–400.0)
RBC: 4.92 Mil/uL (ref 3.87–5.11)
RDW: 13.6 % (ref 11.5–15.5)
WBC: 5.6 10*3/uL (ref 4.0–10.5)

## 2016-03-25 LAB — COMPREHENSIVE METABOLIC PANEL
ALBUMIN: 3.9 g/dL (ref 3.5–5.2)
ALK PHOS: 68 U/L (ref 39–117)
ALT: 33 U/L (ref 0–35)
AST: 19 U/L (ref 0–37)
BUN: 12 mg/dL (ref 6–23)
CHLORIDE: 106 meq/L (ref 96–112)
CO2: 29 mEq/L (ref 19–32)
Calcium: 9.2 mg/dL (ref 8.4–10.5)
Creatinine, Ser: 0.72 mg/dL (ref 0.40–1.20)
GFR: 95.67 mL/min (ref >60.00)
Glucose, Bld: 92 mg/dL (ref 70–99)
POTASSIUM: 3.9 meq/L (ref 3.5–5.1)
Sodium: 141 mEq/L (ref 135–145)
TOTAL PROTEIN: 6.8 g/dL (ref 6.0–8.3)
Total Bilirubin: 0.4 mg/dL (ref 0.2–1.2)

## 2016-03-25 LAB — LIPID PANEL
CHOLESTEROL: 217 mg/dL — AB (ref 0–200)
HDL: 36.6 mg/dL — AB (ref >39.00)
LDL Cholesterol: 154 mg/dL — ABNORMAL HIGH (ref 0–99)
NonHDL: 180.37
Total CHOL/HDL Ratio: 6
Triglycerides: 132 mg/dL (ref 0.0–149.0)
VLDL: 26.4 mg/dL (ref 0.0–40.0)

## 2016-03-25 LAB — TSH: TSH: 0.52 u[IU]/mL (ref 0.35–4.50)

## 2016-03-25 NOTE — Progress Notes (Signed)
Pre visit review using our clinic review tool, if applicable. No additional management support is needed unless otherwise documented below in the visit note. 

## 2016-03-25 NOTE — Assessment & Plan Note (Signed)
Taking asa 81 mg daily

## 2016-03-25 NOTE — Progress Notes (Signed)
Subjective:    Patient ID: Rhonda Adams, female    DOB: 1976-12-08, 40 y.o.   MRN: [id number]  HPI She is here to establish with a new pcp.  She is here for a physical exam.    Migraines:  She has migraines.  She gets a migraine about 1-2 times a month.  She gets a lot of tension headaches as well that can cause migraines.  She takes excedrin as needed.  She has used prescription medication in the past.  She feels the excedrin works well.    She has lost about 12 pounds over the past 3 months.  She is working on changing her eating habits and is trying to increase her exercise. Her exercise is currently irregular. 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  She is thinking about stopping work to be at home with the kids.    MTHFR factor: During her second pregnancy she had an amniotic embolism and is taking a baby aspirin daily.    Medications and allergies reviewed with patient and updated if appropriate.  Patient Active Problem List   Diagnosis Date Noted  . MTHFR mutation ([place]) 03/25/2016  . DIC (disseminated intravascular coagulation) ([place]) 09/07/2013  . Ovarian cyst   . Migraine     Current Outpatient Prescriptions on File Prior to Visit  Medication Sig Dispense Refill  . aspirin EC 81 MG tablet Take 81 mg by mouth daily.    Marland Kitchen aspirin-acetaminophen-caffeine (EXCEDRIN MIGRAINE) 250-250-65 MG per tablet Take 1 tablet by mouth every 6 (six) hours as needed for headache.    . Multiple Vitamins-Minerals (MULTIVITAMIN WITH MINERALS) tablet Take 1 tablet by mouth daily.    . Norethindrone Acetate-Ethinyl Estrad-FE (BLISOVI 24 FE) 1-20 MG-MCG(24) tablet Take 1 tablet by mouth daily. 28 tablet 12   No current facility-administered medications on file prior to visit.    Past Medical History  Diagnosis Date  . Ovarian cyst   . Migraine   . Preterm labor 2012    Past Surgical History  Procedure Laterality Date  . Dilation and curettage of uterus    .  Miscarriage    . Arthroscopic repair acl      left  . Dilation and evacuation N/A 09/04/2013    Procedure: exam under anesthesia placement Bakri balloon;  Surgeon: Woodroe Mode, MD;  Location: [facility];  Service: Gynecology;  Laterality: N/A;  . Orif ankle fracture Right 10/07/2014    Procedure: OPEN REDUCTION INTERNAL FIXATION (ORIF) ANKLE FRACTURE;  Surgeon: Meredith Pel, MD;  Location: [facility];  Service: Orthopedics;  Laterality: Right;    Social History   Social History  . Marital Status: Married    Spouse Name: N/A  . Number of Children: 2  . Years of Education: N/A   Occupational History  . librarian    Social History Main Topics  . Smoking status: Never Smoker   . Smokeless tobacco: Never Used  . Alcohol Use: Yes     Comment: rare, not with preg  . Drug Use: No  . Sexual Activity:    Partners: Male    Birth Control/ Protection: Pill   Other Topics Concern  . None   Social History Narrative   No regular exercise    Family History  Problem Relation Age of Onset  . Cancer Mother 48    BREAST  . Hypertension Father   . Heart disease Maternal Grandmother   . Diabetes  Maternal Grandfather   . Stroke Paternal Grandfather   . Hearing loss Neg Hx     Review of Systems  Constitutional: Negative for fever, chills, appetite change and fatigue.  Eyes: Negative for visual disturbance.  Respiratory: Positive for shortness of breath (deconditioning). Negative for cough and wheezing.   Cardiovascular: Positive for palpitations (rare - caffeine) and leg swelling (left ankle - previous fracture). Negative for chest pain.  Gastrointestinal: Positive for nausea (occasional with certain foods). Negative for abdominal pain, diarrhea, constipation and blood in stool.       No gerd  Genitourinary: Negative for dysuria and hematuria.  Musculoskeletal: Positive for back pain (lower back). Negative for myalgias and arthralgias.  Skin: Negative for color change and rash.    Neurological: Positive for light-headedness and headaches (migraines and tension). Negative for dizziness.       Occasional tingling, itchy sensation on top of scalp - radiates to top of head  Psychiatric/Behavioral: Negative for dysphoric mood. The patient is not nervous/anxious.        Objective:   Filed Vitals:   03/25/16 1009  BP: 124/84  Pulse: 71  Temp: 98.2 F (36.8 C)  Resp: 16   Filed Weights   03/25/16 1009  Weight: 198 lb (89.812 kg)   Body mass index is 32.95 kg/(m^2).   Physical Exam Constitutional: She appears well-developed and well-nourished. No distress.  HENT:  Head: Normocephalic and atraumatic.  Right Ear: External ear normal. Normal ear canal and TM Left Ear: External ear normal.  Normal ear canal and TM Mouth/Throat: Oropharynx is clear and moist.  Eyes: Conjunctivae and EOM are normal.  Neck: Neck supple. No tracheal deviation present. No thyromegaly present.  No carotid bruit  Cardiovascular: Normal rate, regular rhythm and normal heart sounds.   No murmur heard.  No edema. Pulmonary/Chest: Effort normal and breath sounds normal. No respiratory distress. She has no wheezes. She has no rales.  Breast: deferred to Gyn Abdominal: Soft. She exhibits no distension. There is no tenderness.  Lymphadenopathy: She has no cervical adenopathy.  Skin: Skin is warm and dry. She is not diaphoretic.  Psychiatric: She has a normal mood and affect. Her behavior is normal.       Assessment & Plan:   Physical exam: Screening blood work  ordered Immunizations  Up to date  Gyn  Up to date  Exercise - working ot make her exercise more regular Weight - working on weight loss Skin - none Substance abuse - none  See Problem List for Assessment and Plan of chronic medical problems.  Follow up every 1-2 years for a PE.

## 2016-03-25 NOTE — Assessment & Plan Note (Signed)
Taking excedrin as needed - continue She does not feel she needs a prescription medication at this time Work on decreasing stress/tension to prevent tension headaches, which also lead to migraines.

## 2016-03-25 NOTE — Patient Instructions (Addendum)
  Test(s) ordered today. Your results will be released to [facility] (or called to you) after review, usually within 72hours after test completion. If any changes need to be made, you will be notified at that same time.  All other Health Maintenance issues reviewed.   All recommended immunizations and age-appropriate screenings are up-to-date or discussed.  No immunizations administered today.   Medications reviewed and updated.  No changes recommended at this time.   Please followup every 1-2 years for a physical exam.

## 2016-04-08 NOTE — Patient Instructions (Addendum)
Your procedure is scheduled on:  Tuesday, Apr 22, 2016  Enter through the Main Entrance of [facility] at:  8:00 AM  Pick up the phone at the desk and dial [phone].  Call this number if you have problems the morning of surgery: [phone].  Remember:  Do NOT eat food or drink after: Midnight Monday, Apr 21, 2016  Take these medicines the morning of surgery with a SIP OF WATER:  None  Do NOT wear jewelry (body piercing), metal hair clips/bobby pins, make-up, or nail polish. Do NOT wear lotions, powders, or perfumes.  You may wear deodorant. Do NOT shave for 48 hours prior to surgery. Do NOT bring valuables to the hospital. Contacts, dentures, or bridgework may not be worn into surgery.  Have a responsible adult drive you home and stay with you for 24 hours after your procedure  Bring a copy of healthcare power of attorney day of surgery.

## 2016-04-10 ENCOUNTER — Encounter (HOSPITAL_COMMUNITY): Payer: Self-pay

## 2016-04-10 ENCOUNTER — Encounter (HOSPITAL_COMMUNITY)
Admission: RE | Admit: 2016-04-10 | Discharge: 2016-04-10 | Disposition: A | Discharge status: Home / Self Care | Payer: 59 | Source: Ambulatory Visit | Attending: Obstetrics & Gynecology | Admitting: Obstetrics & Gynecology

## 2016-04-10 DIAGNOSIS — Z01812 Encounter for preprocedural laboratory examination: Secondary | ICD-10-CM | POA: Diagnosis present

## 2016-04-10 HISTORY — DX: Amniotic fluid embolism in pregnancy, unspecified trimester: O88.119

## 2016-04-10 HISTORY — DX: Other fracture of unspecified lower leg, initial encounter for closed fracture: S82.899A

## 2016-04-10 HISTORY — DX: Disseminated intravascular coagulation (defibrination syndrome): D65

## 2016-04-10 LAB — CBC
HCT: 41.1 % (ref 36.0–46.0)
Hemoglobin: 13.6 g/dL (ref 12.0–15.0)
MCH: 29 pg (ref 26.0–34.0)
MCHC: 33.1 g/dL (ref 30.0–36.0)
MCV: 87.6 fL (ref 78.0–100.0)
PLATELETS: 198 10*3/uL (ref 150–400)
RBC: 4.69 MIL/uL (ref 3.87–5.11)
RDW: 13.4 % (ref 11.5–15.5)
WBC: 5 10*3/uL (ref 4.0–10.5)

## 2016-04-22 ENCOUNTER — Ambulatory Visit (HOSPITAL_COMMUNITY): Payer: 59 | Admitting: Anesthesiology

## 2016-04-22 ENCOUNTER — Encounter (HOSPITAL_COMMUNITY)
Admission: RE | Disposition: A | Discharge status: Home / Self Care | Payer: Self-pay | Source: Ambulatory Visit | Attending: Obstetrics & Gynecology

## 2016-04-22 ENCOUNTER — Encounter (HOSPITAL_COMMUNITY): Payer: Self-pay

## 2016-04-22 ENCOUNTER — Ambulatory Visit (HOSPITAL_COMMUNITY)
Admission: RE | Admit: 2016-04-22 | Discharge: 2016-04-22 | Disposition: A | Discharge status: Home / Self Care | Payer: 59 | Source: Ambulatory Visit | Attending: Obstetrics & Gynecology | Admitting: Obstetrics & Gynecology

## 2016-04-22 DIAGNOSIS — Z302 Encounter for sterilization: Secondary | ICD-10-CM | POA: Diagnosis not present

## 2016-04-22 DIAGNOSIS — Z7982 Long term (current) use of aspirin: Secondary | ICD-10-CM | POA: Insufficient documentation

## 2016-04-22 HISTORY — PX: LAPAROSCOPIC BILATERAL SALPINGECTOMY: SHX5889

## 2016-04-22 LAB — PREGNANCY, URINE: Preg Test, Ur: NEGATIVE

## 2016-04-22 SURGERY — SALPINGECTOMY, BILATERAL, LAPAROSCOPIC
Anesthesia: General | Site: Abdomen | Laterality: Bilateral

## 2016-04-22 MED ORDER — ONDANSETRON HCL 4 MG/2ML IJ SOLN
INTRAMUSCULAR | Status: AC
  Filled 2016-04-22: qty 2

## 2016-04-22 MED ORDER — GLYCOPYRROLATE 0.2 MG/ML IJ SOLN
INTRAMUSCULAR | Status: DC | PRN
  Administered 2016-04-22: .4 mg via INTRAVENOUS

## 2016-04-22 MED ORDER — PROPOFOL 10 MG/ML IV BOLUS
INTRAVENOUS | Status: DC | PRN
  Administered 2016-04-22: 150 mg via INTRAVENOUS

## 2016-04-22 MED ORDER — NEOSTIGMINE METHYLSULFATE 10 MG/10ML IV SOLN
INTRAVENOUS | Status: DC | PRN
  Administered 2016-04-22: 2.5 mg via INTRAVENOUS

## 2016-04-22 MED ORDER — NEOSTIGMINE METHYLSULFATE 10 MG/10ML IV SOLN
INTRAVENOUS | Status: AC
  Filled 2016-04-22: qty 1

## 2016-04-22 MED ORDER — PROPOFOL 10 MG/ML IV BOLUS
INTRAVENOUS | Status: AC
Start: 2016-04-22 — End: 2016-04-22
  Filled 2016-04-22: qty 20

## 2016-04-22 MED ORDER — FENTANYL CITRATE (PF) 100 MCG/2ML IJ SOLN
INTRAMUSCULAR | Status: AC
  Administered 2016-04-22: 25 ug via INTRAVENOUS
  Filled 2016-04-22: qty 2

## 2016-04-22 MED ORDER — MIDAZOLAM HCL 2 MG/2ML IJ SOLN
INTRAMUSCULAR | Status: AC
  Filled 2016-04-22: qty 2

## 2016-04-22 MED ORDER — SCOPOLAMINE 1 MG/3DAYS TD PT72
1.0000 | MEDICATED_PATCH | Freq: Once | TRANSDERMAL | Status: DC
Start: 2016-04-22 — End: 2016-04-22
  Administered 2016-04-22: 1.5 mg via TRANSDERMAL

## 2016-04-22 MED ORDER — OXYCODONE HCL 5 MG PO TABS
ORAL_TABLET | ORAL | Status: AC
  Filled 2016-04-22: qty 1

## 2016-04-22 MED ORDER — FENTANYL CITRATE (PF) 100 MCG/2ML IJ SOLN
INTRAMUSCULAR | Status: DC | PRN
  Administered 2016-04-22: 50 ug via INTRAVENOUS
  Administered 2016-04-22: 100 ug via INTRAVENOUS

## 2016-04-22 MED ORDER — ONDANSETRON HCL 4 MG/2ML IJ SOLN: 4.0000 mg | Freq: Once | INTRAMUSCULAR | Status: DC | PRN

## 2016-04-22 MED ORDER — LIDOCAINE HCL (CARDIAC) 20 MG/ML IV SOLN
INTRAVENOUS | Status: AC
  Filled 2016-04-22: qty 5

## 2016-04-22 MED ORDER — ONDANSETRON HCL 4 MG/2ML IJ SOLN
INTRAMUSCULAR | Status: DC | PRN
  Administered 2016-04-22: 4 mg via INTRAVENOUS

## 2016-04-22 MED ORDER — DEXAMETHASONE SODIUM PHOSPHATE 4 MG/ML IJ SOLN
INTRAMUSCULAR | Status: DC | PRN
  Administered 2016-04-22: 10 mg via INTRAVENOUS

## 2016-04-22 MED ORDER — OXYCODONE HCL 5 MG PO TABS
5.0000 mg | ORAL_TABLET | Freq: Once | ORAL | Status: AC | PRN
  Administered 2016-04-22: 5 mg via ORAL

## 2016-04-22 MED ORDER — DEXAMETHASONE SODIUM PHOSPHATE 10 MG/ML IJ SOLN
INTRAMUSCULAR | Status: AC
  Filled 2016-04-22: qty 1

## 2016-04-22 MED ORDER — BUPIVACAINE HCL (PF) 0.5 % IJ SOLN
INTRAMUSCULAR | Status: DC | PRN
  Administered 2016-04-22: 10 mL

## 2016-04-22 MED ORDER — FENTANYL CITRATE (PF) 100 MCG/2ML IJ SOLN
25.0000 ug | INTRAMUSCULAR | Status: DC | PRN
  Administered 2016-04-22 (×2): 25 ug via INTRAVENOUS

## 2016-04-22 MED ORDER — SCOPOLAMINE 1 MG/3DAYS TD PT72
MEDICATED_PATCH | TRANSDERMAL | Status: DC
Start: 2016-04-22 — End: 2016-04-22
  Filled 2016-04-22: qty 1

## 2016-04-22 MED ORDER — FENTANYL CITRATE (PF) 250 MCG/5ML IJ SOLN
INTRAMUSCULAR | Status: AC
  Filled 2016-04-22: qty 5

## 2016-04-22 MED ORDER — KETOROLAC TROMETHAMINE 30 MG/ML IJ SOLN
INTRAMUSCULAR | Status: DC | PRN
  Administered 2016-04-22: 30 mg via INTRAVENOUS

## 2016-04-22 MED ORDER — LACTATED RINGERS IV SOLN
INTRAVENOUS | Status: DC
  Administered 2016-04-22: 10:00:00 via INTRAVENOUS
  Administered 2016-04-22: 125 mL/h via INTRAVENOUS

## 2016-04-22 MED ORDER — BUPIVACAINE HCL (PF) 0.5 % IJ SOLN
INTRAMUSCULAR | Status: AC
  Filled 2016-04-22: qty 30

## 2016-04-22 MED ORDER — GLYCOPYRROLATE 0.2 MG/ML IJ SOLN
INTRAMUSCULAR | Status: AC
  Filled 2016-04-22: qty 2

## 2016-04-22 MED ORDER — ROCURONIUM BROMIDE 100 MG/10ML IV SOLN
INTRAVENOUS | Status: AC
  Filled 2016-04-22: qty 1

## 2016-04-22 MED ORDER — OXYCODONE HCL 5 MG/5ML PO SOLN: 5.0000 mg | Freq: Once | ORAL | Status: AC | PRN

## 2016-04-22 MED ORDER — OXYCODONE-ACETAMINOPHEN 5-325 MG PO TABS: 1.0000 | ORAL_TABLET | Freq: Four times a day (QID) | ORAL | Status: DC | PRN

## 2016-04-22 MED ORDER — ROCURONIUM BROMIDE 100 MG/10ML IV SOLN
INTRAVENOUS | Status: DC | PRN
  Administered 2016-04-22: 40 mg via INTRAVENOUS

## 2016-04-22 MED ORDER — MIDAZOLAM HCL 5 MG/5ML IJ SOLN
INTRAMUSCULAR | Status: DC | PRN
  Administered 2016-04-22: 2 mg via INTRAVENOUS

## 2016-04-22 SURGICAL SUPPLY — 26 items
APPLICATOR COTTON TIP 6IN STRL (MISCELLANEOUS) ×3 IMPLANT
CATH ROBINSON RED A/P 16FR (CATHETERS) ×3 IMPLANT
CLOTH BEACON ORANGE TIMEOUT ST (SAFETY) ×3 IMPLANT
DEVICE TROCAR PUNCTURE CLOSURE (ENDOMECHANICALS) ×2 IMPLANT
DRSG COVADERM PLUS 2X2 (GAUZE/BANDAGES/DRESSINGS) ×6 IMPLANT
DRSG OPSITE POSTOP 3X4 (GAUZE/BANDAGES/DRESSINGS) IMPLANT
DURAPREP 26ML APPLICATOR (WOUND CARE) ×3 IMPLANT
GLOVE BIO SURGEON STRL SZ 6.5 (GLOVE) ×2 IMPLANT
GLOVE BIO SURGEONS STRL SZ 6.5 (GLOVE) ×1
GLOVE BIOGEL PI IND STRL 7.0 (GLOVE) ×1 IMPLANT
GLOVE BIOGEL PI INDICATOR 7.0 (GLOVE) ×2
GOWN STRL REUS W/TWL LRG LVL3 (GOWN DISPOSABLE) ×6 IMPLANT
NDL SAFETY ECLIPSE 18X1.5 (NEEDLE) ×1 IMPLANT
NEEDLE HYPO 18GX1.5 SHARP (NEEDLE) ×3
NEEDLE INSUFFLATION 120MM (ENDOMECHANICALS) ×3 IMPLANT
NS IRRIG 1000ML POUR BTL (IV SOLUTION) ×3 IMPLANT
PACK LAPAROSCOPY BASIN (CUSTOM PROCEDURE TRAY) ×3 IMPLANT
PAD TRENDELENBURG POSITION (MISCELLANEOUS) ×3 IMPLANT
SHEARS HARMONIC ACE PLUS 36CM (ENDOMECHANICALS) ×3 IMPLANT
SUT VICRYL 0 UR6 27IN ABS (SUTURE) ×3 IMPLANT
SUT VICRYL 4-0 PS2 18IN ABS (SUTURE) ×6 IMPLANT
SYR 30ML LL (SYRINGE) ×3 IMPLANT
TOWEL OR 17X24 6PK STRL BLUE (TOWEL DISPOSABLE) ×6 IMPLANT
TROCAR OPTI TIP 5M 100M (ENDOMECHANICALS) ×6 IMPLANT
TROCAR XCEL DIL TIP R 11M (ENDOMECHANICALS) ×3 IMPLANT
WARMER LAPAROSCOPE (MISCELLANEOUS) ×3 IMPLANT

## 2016-04-22 NOTE — Transfer of Care (Signed)
Immediate Anesthesia Transfer of Care Note  Patient: Rhonda Adams  Procedure(s) Performed: Procedure(s): LAPAROSCOPIC BILATERAL SALPINGECTOMY (Bilateral)  Patient Location: PACU  Anesthesia Type:General  Level of Consciousness: awake and oriented  Airway & Oxygen Therapy: Patient Spontanous Breathing and Patient connected to nasal cannula oxygen  Post-op Assessment: Report given to RN and Post -op Vital signs reviewed and stable  Post vital signs: Reviewed and stable  Last Vitals:  Filed Vitals:   04/22/16 0756  BP: 120/74  Pulse: 85  Temp: 36.7 C  Resp: 16    Last Pain: There were no vitals filed for this visit.    Patients Stated Pain Goal: 3 ([id number])  Complications: No apparent anesthesia complications

## 2016-04-22 NOTE — Anesthesia Postprocedure Evaluation (Signed)
Anesthesia Post Note  Patient: Rhonda Adams  Procedure(s) Performed: Procedure(s) (LRB): LAPAROSCOPIC BILATERAL SALPINGECTOMY (Bilateral)  Patient location during evaluation: PACU Anesthesia Type: General Level of consciousness: awake, awake and alert and oriented Pain management: pain level controlled Vital Signs Assessment: post-procedure vital signs reviewed and stable Respiratory status: spontaneous breathing, nonlabored ventilation and respiratory function stable Cardiovascular status: blood pressure returned to baseline Anesthetic complications: no    Last Vitals:  Filed Vitals:   04/22/16 1115 04/22/16 1130  BP: 123/53 113/66  Pulse: 62 66  Temp:    Resp: 15 16    Last Pain:  Filed Vitals:   04/22/16 1140  PainSc: 3                  Tylar Merendino COKER

## 2016-04-22 NOTE — Discharge Instructions (Signed)
Laparoscopic Tubal Ligation, Care After Refer to this sheet in the next few weeks. These instructions provide you with information about caring for yourself after your procedure. Your health care provider may also give you more specific instructions. Your treatment has been planned according to current medical practices, but problems sometimes occur. Call your health care provider if you have any problems or questions after your procedure. WHAT TO EXPECT AFTER THE PROCEDURE After your procedure, it is common to have:  Sore throat.  Soreness at the incision site.  Mild cramping.  Tiredness.  Mild nausea or vomiting.  Shoulder pain. HOME CARE INSTRUCTIONS  Rest for the remainder of the day.  Take medicines only as directed by your health care provider. These include over-the-counter medicines and prescription medicines. Do not take aspirin, which can cause bleeding.  Over the next few days, gradually return to your normal activities and your normal diet.  Avoid sexual intercourse for 2 weeks or as directed by your health care provider.  Do not use tampons, and do not douche.  Do not drive or operate heavy machinery while taking pain medicine.  Do not lift anything that is heavier than 5 lb (2.3 kg) for 2 weeks or as directed by your health care provider.  Do not take baths. Take showers only. Ask your health care provider when you can start taking baths.  Take your temperature twice each day and write it down.  Try to have help for your household needs for the first 7-10 days.  There are many different ways to close and cover an incision, including stitches (sutures), skin glue, and adhesive strips. Follow instructions from your health care provider about:  Incision care.  Bandage (dressing) changes and removal.  Incision closure removal.  Check your incision area every day for signs of infection. Watch for:  Redness, swelling, or pain.  Fluid, blood, or pus.  Keep  all follow-up visits as directed by your health care provider. SEEK MEDICAL CARE IF:  You have redness, swelling, or increasing pain in your incision area.  You have fluid, blood, or pus coming from your incision for longer than 1 day.  You notice a bad smell coming from your incision or your dressing.  The edges of your incision break open after the sutures have been removed.  Your pain does not decrease after 2-3 days.  You have a rash.  You repeatedly become dizzy or light-headed.  You have a reaction to your medicine.  Your pain medicine is not helping.  You are constipated. SEEK IMMEDIATE MEDICAL CARE IF:  You have a fever.  You faint.  You have increasing pain in your abdomen.  You have severe pain in one or both of your shoulders.  You have bleeding or drainage from your suture sites or your vagina after surgery.  You have shortness of breath or have difficulty breathing.  You have chest pain or leg pain.  You have ongoing nausea, vomiting, or diarrhea.   This information is not intended to replace advice given to you by your health care provider. Make sure you discuss any questions you have with your health care provider.   Document Released: 06/20/2005 Document Revised: 04/17/2015 Document Reviewed: 03/13/2012 Elsevier Interactive Patient Education 2016 [place] Anesthesia Home Care Instructions  Activity: Get plenty of rest for the remainder of the day. A responsible adult should stay with you for 24 hours following the procedure.  For the next 24 hours, DO NOT: -Drive a  car -Paediatric nurse -Drink alcoholic beverages -Take any medication unless instructed by your physician -Make any legal decisions or sign important papers.  Meals: Start with liquid foods such as gelatin or soup. Progress to regular foods as tolerated. Avoid greasy, spicy, heavy foods. If nausea and/or vomiting occur, drink only clear liquids until the nausea and/or  vomiting subsides. Call your physician if vomiting continues.  Special Instructions/Symptoms: Your throat may feel dry or sore from the anesthesia or the breathing tube placed in your throat during surgery. If this causes discomfort, gargle with warm salt water. The discomfort should disappear within 24 hours.  If you had a scopolamine patch placed behind your ear for the management of post- operative nausea and/or vomiting:  1. The medication in the patch is effective for 72 hours, after which it should be removed.  Wrap patch in a tissue and discard in the trash. Wash hands thoroughly with soap and water. 2. You may remove the patch earlier than 72 hours if you experience unpleasant side effects which may include dry mouth, dizziness or visual disturbances. 3. Avoid touching the patch. Wash your hands with soap and water after contact with the patch.

## 2016-04-22 NOTE — Anesthesia Preprocedure Evaluation (Signed)
Anesthesia Evaluation  Patient identified by MRN, date of birth, ID band Patient awake    Reviewed: Allergy & Precautions, NPO status , Patient's Chart, lab work & pertinent test results  Airway Mallampati: II  TM Distance: >3 FB Neck ROM: Full    Dental  (+) Teeth Intact, Dental Advisory Given   Pulmonary    breath sounds clear to auscultation       Cardiovascular  Rhythm:Regular Rate:Normal     Neuro/Psych    GI/Hepatic   Endo/Other    Renal/GU      Musculoskeletal   Abdominal   Peds  Hematology   Anesthesia Other Findings   Reproductive/Obstetrics                             Anesthesia Physical Anesthesia Plan  ASA: I  Anesthesia Plan: General   Post-op Pain Management:    Induction: Intravenous  Airway Management Planned: Oral ETT  Additional Equipment:   Intra-op Plan:   Post-operative Plan:   Informed Consent: I have reviewed the patients History and Physical, chart, labs and discussed the procedure including the risks, benefits and alternatives for the proposed anesthesia with the patient or authorized representative who has indicated his/her understanding and acceptance.   Dental advisory given  Plan Discussed with: CRNA and Anesthesiologist  Anesthesia Plan Comments:         Anesthesia Quick Evaluation

## 2016-04-22 NOTE — H&P (Signed)
Rhonda Adams is an 40 y.o. female.MW P2 (40 and 49 yo kids) here for a laparoscopic bilateral salpingectomy for permanent sterility. She is not interested in other forms of birth control. They are certain that they want NO MORE KIDS.   No LMP recorded.    Past Medical History  Diagnosis Date  . Ovarian cyst   . Preterm labor 2012  . Migraine   . DIC (disseminated intravascular coagulation) ([place]) 2012  . Amniotic fluid embolism during pregnancy   . Broken ankle     Past Surgical History  Procedure Laterality Date  . Dilation and curettage of uterus    . Miscarriage    . Arthroscopic repair acl      left  . Dilation and evacuation N/A 09/04/2013    Procedure: exam under anesthesia placement Bakri balloon;  Surgeon: Woodroe Mode, MD;  Location: [facility];  Service: Gynecology;  Laterality: N/A;  . Orif ankle fracture Right 10/07/2014    Procedure: OPEN REDUCTION INTERNAL FIXATION (ORIF) ANKLE FRACTURE;  Surgeon: Meredith Pel, MD;  Location: [facility];  Service: Orthopedics;  Laterality: Right;    Family History  Problem Relation Age of Onset  . Cancer Mother 58    BREAST  . Hypertension Father   . Heart disease Maternal Grandmother   . Diabetes Maternal Grandfather   . Stroke Paternal Grandfather   . Hearing loss Neg Hx     Social History:  reports that she has never smoked. She has never used smokeless tobacco. She reports that she drinks alcohol. She reports that she does not use illicit drugs.  Allergies:  Allergies  Allergen Reactions  . Berniece Salines Flavor Nausea And Vomiting    Pt states she is allergic to bacon; not bacon flavor.    Prescriptions prior to admission  Medication Sig Dispense Refill Last Dose  . aspirin EC 81 MG tablet Take 81 mg by mouth daily.   04/21/2016 at 0730  . aspirin-acetaminophen-caffeine (EXCEDRIN MIGRAINE) 250-250-65 MG per tablet Take 1 tablet by mouth every 6 (six) hours as needed for headache.   Past Week at Unknown time  . ibuprofen  (ADVIL,MOTRIN) 200 MG tablet Take 400 mg by mouth every 6 (six) hours as needed for headache or moderate pain.   Past Month at Unknown time  . Multiple Vitamins-Minerals (MULTIVITAMIN WITH MINERALS) tablet Take 1 tablet by mouth daily.   04/21/2016 at 0700  . Norethindrone Acetate-Ethinyl Estrad-FE (BLISOVI 24 FE) 1-20 MG-MCG(24) tablet Take 1 tablet by mouth daily. 28 tablet 12 04/21/2016 at 0730    ROS  Blood pressure 120/74, pulse 85, temperature 98 F (36.7 C), temperature source Oral, resp. rate 16, SpO2 99 %. Physical Exam Heart- rrr Lungs- CTAB Abd- benign  Results for orders placed or performed during the hospital encounter of 04/22/16 (from the past 24 hour(s))  Pregnancy, urine     Status: None   Collection Time: 04/22/16  8:00 AM  Result Value Ref Range   Preg Test, Ur NEGATIVE NEGATIVE    No results found.  Assessment/Plan: Desire for permanent sterility. Plan for l/s BS, aware of decreased incidence of ovarian cancer in the future.  She understands the risks of surgery, including, but not to infection, bleeding, DVTs, damage to bowel, bladder, ureters. She wishes to proceed.     Charleen Madera C. 04/22/2016, 8:55 AM

## 2016-04-23 ENCOUNTER — Encounter (HOSPITAL_COMMUNITY): Payer: Self-pay | Admitting: Obstetrics & Gynecology

## 2016-04-28 NOTE — Op Note (Signed)
04/22/2016  3:16 PM  PATIENT:  Rhonda Adams  40 y.o. female  PRE-OPERATIVE DIAGNOSIS:  Undesired fertility  POST-OPERATIVE DIAGNOSIS:  Undesired fertility  PROCEDURE:  Procedure(s): LAPAROSCOPIC BILATERAL SALPINGECTOMY (Bilateral)  SURGEON:  Surgeon(s) and Role:    * Emily Filbert, MD - Primary   ANESTHESIA:   general  EBL:   minimal  BLOOD ADMINISTERED:none  DRAINS: none   LOCAL MEDICATIONS USED:  MARCAINE     SPECIMEN:  Source of Specimen:  bilateral oviducts  DISPOSITION OF SPECIMEN:  PATHOLOGY  COUNTS:  YES  TOURNIQUET:  * No tourniquets in log *  DICTATION: .Dragon Dictation  PLAN OF CARE: Discharge to home after PACU  PATIENT DISPOSITION:  PACU - hemodynamically stable.   Delay start of Pharmacological VTE agent (>24hrs) due to surgical blood loss or risk of bleeding: not applicable  The risks, benefits, and alternatives of surgery were explained, understood, accepted. She is certain that she wants permanent sterility. She understands that this is not reversible. She understands there is a small failure rate of this procedure. She also would like to have both oviducts removed her due newest recommendations to help prevent ovarian/peritoneal cancer. In the operating room she was placed in the dorsal lithotomy position, and general anesthesia was given without complication. Her abdomen and vagina were prepped and draped in the usual sterile fashion. A timeout procedure was done. A bimanual exam revealed a small anteverted and mobile uterus. Her adnexa felt normal. A Hulka manipulator was placed. Her bladder was emptied with a Robinson catheter. Gloves were changed, and attention was turned to the abdomen. Approximately the 5 mL of 0.5% Marcaine was injected into the umbilicus. A vertical incision was made at the site. A varies needle was placed intraperitoneally. Low-flow CO2 was used to insufflate the abdomen to approximately 3-1/2 L. Once a good pneumoperitoneum was  established, a 5 mm Excel trocar was placed in the umbilical incision. Laparoscopy confirmed correct placement. A 5 mm port was placed in the left lower quadrant and a 10 mm port in the right lower quadrant under direct laparoscopic visualization after injecting 0.5% Marcaine in the incision sites. Her pelvis and upper abdomen appeared normal. A Harmonic scapel was used to hemostatically removed both oviducts. I removed the oviducts through the 10 mm port. I removed the 5 mm ports and noted hemostasis. The fascia at the 10 mm incision site was closed with a 0 vicryl suture using the [employer] fascial closure device. No defects were palpable. A subcuticular closure was done with 4-0 Vicryl suture at all incision sites. A Steri-Strip was placed across each incision. She was extubated and taken to the recovery room in stable condition.

## 2016-06-02 ENCOUNTER — Encounter: Payer: Self-pay | Admitting: Obstetrics & Gynecology

## 2016-06-02 ENCOUNTER — Ambulatory Visit (INDEPENDENT_AMBULATORY_CARE_PROVIDER_SITE_OTHER): Payer: 59 | Admitting: Obstetrics & Gynecology

## 2016-06-02 VITALS — BP 116/76 | HR 99 | Resp 16

## 2016-06-02 DIAGNOSIS — Z9889 Other specified postprocedural states: Secondary | ICD-10-CM | POA: Diagnosis not present

## 2016-06-02 NOTE — Progress Notes (Signed)
   Subjective:    Patient ID: Rhonda Adams, female    DOB: [birth year], 40 y.o.   MRN: [id number]  HPI 40 yo lady here for a 6 week post op check after having a L/S BS. She has no problems. She has not had sex since surgery as her husband has a back injury.   Review of Systems     Objective:   Physical Exam [id number] Breathing, conversing, and ambulating normally Abd- benign Incisions- healed well       Assessment & Plan:  Post op- doing well RTC 1 year/prn sooner We did discuss ACOG recs re pap smears.

## 2016-09-03 ENCOUNTER — Ambulatory Visit (INDEPENDENT_AMBULATORY_CARE_PROVIDER_SITE_OTHER): Payer: BLUE CROSS/BLUE SHIELD | Admitting: Nurse Practitioner

## 2016-09-03 ENCOUNTER — Encounter: Payer: Self-pay | Admitting: Nurse Practitioner

## 2016-09-03 ENCOUNTER — Other Ambulatory Visit: Payer: Self-pay | Admitting: *Deleted

## 2016-09-03 VITALS — BP 132/68 | HR 72 | Temp 97.7°F | Ht 60.0 in | Wt 163.0 lb

## 2016-09-03 DIAGNOSIS — B0229 Other postherpetic nervous system involvement: Secondary | ICD-10-CM | POA: Diagnosis not present

## 2016-09-03 DIAGNOSIS — B0089 Other herpesviral infection: Secondary | ICD-10-CM

## 2016-09-03 MED ORDER — GABAPENTIN 300 MG PO CAPS: ORAL_CAPSULE | ORAL | 1 refills | Status: DC

## 2016-09-03 NOTE — Progress Notes (Signed)
Subjective:  Patient ID: Rhonda Adams, female    DOB: Apr 14, 1976  Age: 40 y.o. MRN: [id number]  CC: Rash (Pt stated went to urgent care diagnosed  shingle abdominal later side for about 1 week)   Rash  This is a new problem. The current episode started 1 to 4 weeks ago. The problem is unchanged. The affected locations include the abdomen (left side). The rash is characterized by burning, itchiness and pain. She was exposed to nothing. Pertinent negatives include no fatigue, fever, joint pain or vomiting. Past treatments include topical steroids. The treatment provided no relief. Her past medical history is significant for varicella. There is no history of allergies.    Outpatient Medications Prior to Visit  Medication Sig Dispense Refill  . aspirin EC 81 MG tablet Take 81 mg by mouth daily.    . Multiple Vitamins-Minerals (MULTIVITAMIN WITH MINERALS) tablet Take 1 tablet by mouth daily.    Marland Kitchen triamcinolone cream (KENALOG) 0.1 % APP ON THE SKIN TID PRF RASH AND ITCHING  0  . ibuprofen (ADVIL,MOTRIN) 200 MG tablet Take 400 mg by mouth every 6 (six) hours as needed for headache or moderate pain.    Marland Kitchen oxyCODONE-acetaminophen (PERCOCET/ROXICET) 5-325 MG tablet Take 1-2 tablets by mouth every 6 (six) hours as needed. (Patient not taking: Reported on 09/03/2016) 30 tablet 0   No facility-administered medications prior to visit.     ROS See HPI  Objective:  BP 132/68 (BP Location: Left Arm, Patient Position: Sitting, Cuff Size: Normal)   Pulse 72   Temp 97.7 F (36.5 C)   Ht 5' (1.524 m)   Wt 163 lb (73.9 kg)   BMI 31.83 kg/m   BP Readings from Last 3 Encounters:  09/03/16 132/68  06/02/16 116/76  04/22/16 (!) 151/87    Wt Readings from Last 3 Encounters:  09/03/16 163 lb (73.9 kg)  04/10/16 196 lb 6 oz (89.1 kg)  03/25/16 198 lb (89.8 kg)    Physical Exam  Constitutional: She is oriented to person, place, and time. No distress.  Neck: Normal range of motion. Neck supple.    Cardiovascular: Normal rate.   Pulmonary/Chest: Effort normal.  Abdominal: Soft. She exhibits no distension. There is no tenderness.  Neurological: She is alert and oriented to person, place, and time.  Skin: Skin is warm and dry. Lesion and rash noted. Rash is maculopapular. Rash is not macular. There is erythema.     Clustered erythematous rash. No induration, no drainage.  Psychiatric: She has a normal mood and affect. Her behavior is normal.  Vitals reviewed.   Lab Results  Component Value Date   WBC 5.0 04/10/2016   HGB 13.6 04/10/2016   HCT 41.1 04/10/2016   PLT 198 04/10/2016   GLUCOSE 92 03/25/2016   CHOL 217 (H) 03/25/2016   TRIG 132.0 03/25/2016   HDL 36.60 (L) 03/25/2016   LDLCALC 154 (H) 03/25/2016   ALT 33 03/25/2016   AST 19 03/25/2016   NA 141 03/25/2016   K 3.9 03/25/2016   CL 106 03/25/2016   CREATININE 0.72 03/25/2016   BUN 12 03/25/2016   CO2 29 03/25/2016   TSH 0.52 03/25/2016   INR 1.13 09/04/2013    No results found.  Assessment & Plan:   Siobahn was seen today for rash.  Diagnoses and all orders for this visit:  Neuralgia, post-herpetic  Herpetic dermatitis   I am having Ms. Bame maintain her multivitamin with minerals, aspirin EC, ibuprofen, oxyCODONE-acetaminophen, and triamcinolone  cream.  No orders of the defined types were placed in this encounter.   Follow-up: Return if symptoms worsen or fail to improve.  Wilfred Lacy, NP

## 2016-09-03 NOTE — Patient Instructions (Signed)
May also use lidocaine or capsaicin cream OTC as needed for topical pain.  Postherpetic Neuralgia Postherpetic neuralgia (PHN) is nerve pain that occurs after a shingles infection. Shingles is a painful rash that appears on one side of the body, usually on your trunk or face. Shingles is caused by the varicella-zoster virus. This is the same virus that causes chickenpox. In people who have had chickenpox, the virus can resurface years later and cause shingles. You may have PHN if you continue to have pain for 3 months after your shingles rash has gone away. PHN appears in the same area where you had the shingles rash. For most people, PHN goes away within 1 year.  Getting a vaccination for shingles can prevent PHN. This vaccine is recommended for people older than 50. It may prevent shingles and may also lower your risk of PHN if you do get shingles. CAUSES PHN is caused by damage to your nerves from the varicella-zoster virus. This damage makes your nerves overly sensitive.  RISK FACTORS Aging is the biggest risk factor for developing PHN. Most people who get PHN are older than 18. Other risk factors include:  Having very bad pain before your shingles rash starts.  Having a very bad rash.  Having shingles in the nerve that supplies your face and eye (trigeminal nerve). SIGNS AND SYMPTOMS Pain is the main symptom of PHN. The pain is often very bad and may be described as stabbing, burning, or feeling like an electric shock. The pain may come and go or may be there all the time. Pain may be triggered by light touches on the skin or changes in temperature. You may have itching along with the pain. DIAGNOSIS  Your health care provider may diagnose PHN based on your symptoms and your history of shingles. Lab studies and other diagnostic tests are usually not needed. TREATMENT  There is no cure for PHN. Treatment for PHN will focus on pain relief. Over-the-counter pain relievers do not usually  relieve PHN pain. You may need to work with a pain specialist. Treatment may include:  Antidepressant medicines to help with pain and improve sleep.  Antiseizure medicines to relieve nerve pain.  Strong pain relievers (opioids).  A numbing patch worn on the skin (lidocaine patch). HOME CARE INSTRUCTIONS It may take a long time to recover from PHN. Work closely with your health care provider, and have a good support system at home.   Take all medicines as directed by your health care provider.  Wear loose, comfortable clothing.  Cover sensitive areas with a dressing to reduce friction from clothing rubbing on the area.  If cold does not make your pain worse, try applying a cool compress or cooling gel pack to the area.  Talk to your health care provider if you feel depressed or desperate. Living with long-term pain can be depressing. SEEK MEDICAL CARE IF:  Your medicine is not helping.  You are struggling to manage your pain at home.   This information is not intended to replace advice given to you by your health care provider. Make sure you discuss any questions you have with your health care provider.   Document Released: 02/21/2003 Document Revised: 12/22/2014 Document Reviewed: 11/22/2013 Elsevier Interactive Patient Education [employer].

## 2016-09-11 ENCOUNTER — Telehealth: Payer: Self-pay | Admitting: Nurse Practitioner

## 2016-09-11 NOTE — Telephone Encounter (Signed)
Routing to greg in dr burns absence, please advise, thanks

## 2016-09-11 NOTE — Telephone Encounter (Signed)
Please have her reduce back to twice daily and follow up if she needs to.

## 2016-09-11 NOTE — Telephone Encounter (Signed)
gabapentin (NEURONTIN) 300 MG   Patient states that she needs a nurse to call her back about this medication. She can not take the 3 pills a day, it is making her feel weird. If she take 2 pills she is ok. Please follow up with patient.

## 2016-09-12 NOTE — Telephone Encounter (Signed)
Pt aware.

## 2016-12-30 DIAGNOSIS — J342 Deviated nasal septum: Secondary | ICD-10-CM | POA: Insufficient documentation

## 2017-01-29 ENCOUNTER — Ambulatory Visit (INDEPENDENT_AMBULATORY_CARE_PROVIDER_SITE_OTHER): Payer: BLUE CROSS/BLUE SHIELD | Admitting: Internal Medicine

## 2017-01-29 VITALS — BP 118/78 | HR 101 | Temp 98.1°F | Resp 16 | Ht 60.0 in | Wt 167.1 lb

## 2017-01-29 DIAGNOSIS — J029 Acute pharyngitis, unspecified: Secondary | ICD-10-CM | POA: Diagnosis not present

## 2017-01-29 DIAGNOSIS — B9789 Other viral agents as the cause of diseases classified elsewhere: Secondary | ICD-10-CM

## 2017-01-29 DIAGNOSIS — J069 Acute upper respiratory infection, unspecified: Secondary | ICD-10-CM | POA: Diagnosis not present

## 2017-01-29 LAB — POCT RAPID STREP A (OFFICE): Rapid Strep A Screen: NEGATIVE

## 2017-01-29 NOTE — Progress Notes (Signed)
Pre visit review using our clinic review tool, if applicable. No additional management support is needed unless otherwise documented below in the visit note. 

## 2017-01-29 NOTE — Patient Instructions (Signed)
Upper Respiratory Infection, Adult Most upper respiratory infections (URIs) are caused by a virus. A URI affects the nose, throat, and upper air passages. The most common type of URI is often called "the common cold." Follow these instructions at home:  Take medicines only as told by your doctor.  Gargle warm saltwater or take cough drops to comfort your throat as told by your doctor.  Use a warm mist humidifier or inhale steam from a shower to increase air moisture. This may make it easier to breathe.  Drink enough fluid to keep your pee (urine) clear or pale yellow.  Eat soups and other clear broths.  Have a healthy diet.  Rest as needed.  Go back to work when your fever is gone or your doctor says it is okay.  You may need to stay home longer to avoid giving your URI to others.  You can also wear a face mask and wash your hands often to prevent spread of the virus.  Use your inhaler more if you have asthma.  Do not use any tobacco products, including cigarettes, chewing tobacco, or electronic cigarettes. If you need help quitting, ask your doctor. Contact a doctor if:  You are getting worse, not better.  Your symptoms are not helped by medicine.  You have chills.  You are getting more short of breath.  You have brown or red mucus.  You have yellow or brown discharge from your nose.  You have pain in your face, especially when you bend forward.  You have a fever.  You have puffy (swollen) neck glands.  You have pain while swallowing.  You have white areas in the back of your throat. Get help right away if:  You have very bad or constant:  Headache.  Ear pain.  Pain in your forehead, behind your eyes, and over your cheekbones (sinus pain).  Chest pain.  You have long-lasting (chronic) lung disease and any of the following:  Wheezing.  Long-lasting cough.  Coughing up blood.  A change in your usual mucus.  You have a stiff neck.  You have  changes in your:  Vision.  Hearing.  Thinking.  Mood. This information is not intended to replace advice given to you by your health care provider. Make sure you discuss any questions you have with your health care provider. Document Released: 05/19/2008 Document Revised: 08/03/2016 Document Reviewed: 03/08/2014 Elsevier Interactive Patient Education  2017 Elsevier Inc.  

## 2017-02-01 ENCOUNTER — Encounter: Payer: Self-pay | Admitting: Internal Medicine

## 2017-02-01 NOTE — Progress Notes (Signed)
Subjective:  Patient ID: Rhonda Adams, female    DOB: 1976/07/20  Age: 41 y.o. MRN: [id number]  CC: URI   HPI Rhonda Adams presents for A 4 day history of sore throat and mild nonproductive cough. She denies fevers, chills, lymphadenopathy, or rash.  Outpatient Medications Prior to Visit  Medication Sig Dispense Refill  . aspirin EC 81 MG tablet Take 81 mg by mouth daily.    Marland Kitchen ibuprofen (ADVIL,MOTRIN) 200 MG tablet Take 400 mg by mouth every 6 (six) hours as needed for headache or moderate pain.    . Multiple Vitamins-Minerals (MULTIVITAMIN WITH MINERALS) tablet Take 1 tablet by mouth daily.    Marland Kitchen gabapentin (NEURONTIN) 300 MG capsule Start with 1cap once a day at bedtime x3days, then 1cap every 12hrs x 3days, then 1cap every 8hrs continuously x 89months, then taper off in similar fashion. (Patient not taking: Reported on 01/29/2017) 90 capsule 1  . oxyCODONE-acetaminophen (PERCOCET/ROXICET) 5-325 MG tablet Take 1-2 tablets by mouth every 6 (six) hours as needed. (Patient not taking: Reported on 09/03/2016) 30 tablet 0  . triamcinolone cream (KENALOG) 0.1 % APP ON THE SKIN TID PRF RASH AND ITCHING  0   No facility-administered medications prior to visit.     ROS Review of Systems  Constitutional: Negative for chills, diaphoresis, fatigue, fever and unexpected weight change.  HENT: Positive for sore throat. Negative for congestion, facial swelling, rhinorrhea, sinus pain, sinus pressure and trouble swallowing.   Eyes: Negative.   Respiratory: Positive for cough. Negative for choking, chest tightness, shortness of breath and stridor.   Cardiovascular: Negative for chest pain, palpitations and leg swelling.  Gastrointestinal: Negative for abdominal pain, diarrhea, nausea and vomiting.  Endocrine: Negative.   Genitourinary: Negative.   Musculoskeletal: Negative.  Negative for back pain, myalgias and neck pain.  Skin: Negative.  Negative for color change and rash.  Allergic/Immunologic:  Negative.   Neurological: Negative.   Hematological: Negative.  Negative for adenopathy. Does not bruise/bleed easily.  Psychiatric/Behavioral: Negative.     Objective:  BP 118/78 (BP Location: Right Arm, Patient Position: Sitting, Cuff Size: Normal)   Pulse (!) 101   Temp 98.1 F (36.7 C) (Oral)   Resp 16   Ht 5' (1.524 m)   Wt 167 lb 1.3 oz (75.8 kg)   SpO2 98%   BMI 32.63 kg/m   BP Readings from Last 3 Encounters:  01/29/17 118/78  09/03/16 132/68  06/02/16 116/76    Wt Readings from Last 3 Encounters:  01/29/17 167 lb 1.3 oz (75.8 kg)  09/03/16 163 lb (73.9 kg)  04/10/16 196 lb 6 oz (89.1 kg)    Physical Exam  Constitutional: She is oriented to person, place, and time. No distress.  HENT:  Mouth/Throat: Oropharynx is clear and moist and mucous membranes are normal. Mucous membranes are not pale and not cyanotic. No oropharyngeal exudate, posterior oropharyngeal edema, posterior oropharyngeal erythema or tonsillar abscesses.  Eyes: Conjunctivae are normal. Right eye exhibits no discharge. Left eye exhibits no discharge. No scleral icterus.  Neck: Normal range of motion. Neck supple. No JVD present. No tracheal deviation present. No thyromegaly present.  Cardiovascular: Normal rate, regular rhythm, normal heart sounds and intact distal pulses.  Exam reveals no gallop and no friction rub.   No murmur heard. Pulmonary/Chest: Effort normal and breath sounds normal. No stridor. No respiratory distress. She has no wheezes. She has no rales. She exhibits no tenderness.  Abdominal: Soft. Bowel sounds are normal. She exhibits  no distension and no mass. There is no tenderness. There is no rebound and no guarding.  Musculoskeletal: Normal range of motion. She exhibits no edema, tenderness or deformity.  Neurological: She is oriented to person, place, and time.  Skin: Skin is warm and dry. No rash noted. She is not diaphoretic. No erythema. No pallor.  Vitals reviewed.   Lab  Results  Component Value Date   WBC 5.0 04/10/2016   HGB 13.6 04/10/2016   HCT 41.1 04/10/2016   PLT 198 04/10/2016   GLUCOSE 92 03/25/2016   CHOL 217 (H) 03/25/2016   TRIG 132.0 03/25/2016   HDL 36.60 (L) 03/25/2016   LDLCALC 154 (H) 03/25/2016   ALT 33 03/25/2016   AST 19 03/25/2016   NA 141 03/25/2016   K 3.9 03/25/2016   CL 106 03/25/2016   CREATININE 0.72 03/25/2016   BUN 12 03/25/2016   CO2 29 03/25/2016   TSH 0.52 03/25/2016   INR 1.13 09/04/2013    No results found.  Assessment & Plan:   Rhonda Adams was seen today for uri.  Diagnoses and all orders for this visit:  Sore throat- her strep screen is negative -     POCT rapid strep A  Viral URI with cough- symptoms are mild and she does not want a medication to help control her symptoms.   I am having Rhonda Adams maintain her multivitamin with minerals, aspirin EC, ibuprofen, oxyCODONE-acetaminophen, triamcinolone cream, and gabapentin.  No orders of the defined types were placed in this encounter.    Follow-up: Return if symptoms worsen or fail to improve.  Scarlette Calico, MD

## 2017-10-13 ENCOUNTER — Encounter: Payer: Self-pay | Admitting: Internal Medicine

## 2017-10-13 ENCOUNTER — Other Ambulatory Visit (INDEPENDENT_AMBULATORY_CARE_PROVIDER_SITE_OTHER): Payer: 59

## 2017-10-13 ENCOUNTER — Ambulatory Visit (INDEPENDENT_AMBULATORY_CARE_PROVIDER_SITE_OTHER): Payer: 59 | Admitting: Internal Medicine

## 2017-10-13 VITALS — BP 122/76 | HR 104 | Temp 98.3°F | Resp 16 | Ht 62.0 in | Wt 187.0 lb

## 2017-10-13 DIAGNOSIS — Z0000 Encounter for general adult medical examination without abnormal findings: Secondary | ICD-10-CM | POA: Diagnosis not present

## 2017-10-13 DIAGNOSIS — G43809 Other migraine, not intractable, without status migrainosus: Secondary | ICD-10-CM | POA: Diagnosis not present

## 2017-10-13 DIAGNOSIS — E7212 Methylenetetrahydrofolate reductase deficiency: Secondary | ICD-10-CM | POA: Diagnosis not present

## 2017-10-13 DIAGNOSIS — Z23 Encounter for immunization: Secondary | ICD-10-CM | POA: Diagnosis not present

## 2017-10-13 DIAGNOSIS — Z1589 Genetic susceptibility to other disease: Secondary | ICD-10-CM

## 2017-10-13 LAB — CBC WITH DIFFERENTIAL/PLATELET
BASOS ABS: 0 10*3/uL (ref 0.0–0.1)
Basophils Relative: 0.6 % (ref 0.0–3.0)
Eosinophils Absolute: 0.1 10*3/uL (ref 0.0–0.7)
Eosinophils Relative: 1.4 % (ref 0.0–5.0)
HCT: 41.4 % (ref 36.0–46.0)
Hemoglobin: 13.5 g/dL (ref 12.0–15.0)
LYMPHS ABS: 0.8 10*3/uL (ref 0.7–4.0)
LYMPHS PCT: 22.9 % (ref 12.0–46.0)
MCHC: 32.7 g/dL (ref 30.0–36.0)
MCV: 88.7 fl (ref 78.0–100.0)
MONOS PCT: 13.6 % — AB (ref 3.0–12.0)
Monocytes Absolute: 0.5 10*3/uL (ref 0.1–1.0)
NEUTROS ABS: 2.2 10*3/uL (ref 1.4–7.7)
NEUTROS PCT: 61.5 % (ref 43.0–77.0)
PLATELETS: 164 10*3/uL (ref 150.0–400.0)
RBC: 4.66 Mil/uL (ref 3.87–5.11)
RDW: 13.2 % (ref 11.5–15.5)
WBC: 3.6 10*3/uL — ABNORMAL LOW (ref 4.0–10.5)

## 2017-10-13 LAB — COMPREHENSIVE METABOLIC PANEL
ALT: 16 U/L (ref 0–35)
AST: 15 U/L (ref 0–37)
Albumin: 4 g/dL (ref 3.5–5.2)
Alkaline Phosphatase: 67 U/L (ref 39–117)
BUN: 12 mg/dL (ref 6–23)
CALCIUM: 9.1 mg/dL (ref 8.4–10.5)
CHLORIDE: 105 meq/L (ref 96–112)
CO2: 27 mEq/L (ref 19–32)
Creatinine, Ser: 0.68 mg/dL (ref 0.40–1.20)
GFR: 101.39 mL/min (ref >60.00)
Glucose, Bld: 85 mg/dL (ref 70–99)
Potassium: 4 mEq/L (ref 3.5–5.1)
Sodium: 140 mEq/L (ref 135–145)
Total Bilirubin: 0.3 mg/dL (ref 0.2–1.2)
Total Protein: 6.7 g/dL (ref 6.0–8.3)

## 2017-10-13 LAB — LIPID PANEL
Cholesterol: 225 mg/dL — ABNORMAL HIGH (ref 0–200)
HDL: 47.2 mg/dL (ref >39.00)
LDL CALC: 162 mg/dL — AB (ref 0–99)
NonHDL: 178.06
TRIGLYCERIDES: 82 mg/dL (ref 0.0–149.0)
Total CHOL/HDL Ratio: 5
VLDL: 16.4 mg/dL (ref 0.0–40.0)

## 2017-10-13 LAB — TSH: TSH: 0.95 u[IU]/mL (ref 0.35–4.50)

## 2017-10-13 NOTE — Progress Notes (Signed)
Subjective:    Patient ID: Rhonda Adams, female    DOB: 1976-03-21, 41 y.o.   MRN: [id number]  HPI She is here for a physical exam.   She has a bump that formed under her right eye.  It has gotten a little.  It feels a twinge there once a while.  She first noticed it about 8 months ago.  She did stop working and has been home and it has been a transition.  Overall it is going well and she is happy with her decision.  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  Medications and allergies reviewed with patient and updated if appropriate.  Patient Active Problem List   Diagnosis Date Noted  . Deviated septum 12/30/2016  . MTHFR mutation ([place]) 03/25/2016  . DIC (disseminated intravascular coagulation) ([place]) 09/07/2013  . Migraine     Current Outpatient Prescriptions on File Prior to Visit  Medication Sig Dispense Refill  . aspirin EC 81 MG tablet Take 81 mg by mouth daily.    Marland Kitchen ibuprofen (ADVIL,MOTRIN) 200 MG tablet Take 400 mg by mouth every 6 (six) hours as needed for headache or moderate pain.    . Multiple Vitamins-Minerals (MULTIVITAMIN WITH MINERALS) tablet Take 1 tablet by mouth daily.     No current facility-administered medications on file prior to visit.     Past Medical History:  Diagnosis Date  . Amniotic fluid embolism during pregnancy   . Broken ankle   . DIC (disseminated intravascular coagulation) ([place]) 2012  . Migraine   . Ovarian cyst   . Preterm labor 2012    Past Surgical History:  Procedure Laterality Date  . ARTHROSCOPIC REPAIR ACL     left  . DILATION AND CURETTAGE OF UTERUS    . DILATION AND EVACUATION N/A 09/04/2013   Procedure: exam under anesthesia placement Bakri balloon;  Surgeon: Woodroe Mode, MD;  Location: [facility];  Service: Gynecology;  Laterality: N/A;  . LAPAROSCOPIC BILATERAL SALPINGECTOMY Bilateral 04/22/2016   Procedure: LAPAROSCOPIC BILATERAL SALPINGECTOMY;  Surgeon: Emily Filbert, MD;  Location: [facility];  Service: Gynecology;   Laterality: Bilateral;  . MISCARRIAGE    . ORIF ANKLE FRACTURE Right 10/07/2014   Procedure: OPEN REDUCTION INTERNAL FIXATION (ORIF) ANKLE FRACTURE;  Surgeon: Meredith Pel, MD;  Location: [facility];  Service: Orthopedics;  Laterality: Right;    Social History   Social History  . Marital status: Married    Spouse name: N/A  . Number of children: 2  . Years of education: N/A   Occupational History  . librarian    Social History Main Topics  . Smoking status: Never Smoker  . Smokeless tobacco: Never Used  . Alcohol use Yes     Comment: rare  . Drug use: No  . Sexual activity: Yes    Partners: Male    Birth control/ protection: Pill, Surgical   Other Topics Concern  . None   Social History Narrative   No regular exercise    Family History  Problem Relation Age of Onset  . Cancer Mother 45       BREAST  . Hypertension Father   . Heart disease Maternal Grandmother   . Diabetes Maternal Grandfather   . Stroke Paternal Grandfather   . Hearing loss Neg Hx     Review of Systems  Constitutional: Negative for chills and fever.  Eyes: Negative for visual disturbance.  Respiratory: Negative for cough, shortness of breath and wheezing.  Cardiovascular: Positive for palpitations (occ - too much caffeine). Negative for chest pain and leg swelling.  Gastrointestinal: Negative for abdominal pain, blood in stool, constipation, diarrhea and nausea.  Genitourinary: Negative for dysuria and hematuria.  Musculoskeletal: Positive for back pain. Negative for arthralgias.  Skin: Negative for rash.       Lesion right lower eye lid  Neurological: Positive for dizziness (a couple of times) and headaches (migrianes on occasion).  Psychiatric/Behavioral: Negative for dysphoric mood. The patient is not nervous/anxious.        Objective:   Vitals:   10/13/17 1119  BP: 122/76  Pulse: (!) 104  Resp: 16  Temp: 98.3 F (36.8 C)  SpO2: 98%   Filed Weights   10/13/17 1119  Weight:  187 lb (84.8 kg)   Body mass index is 34.2 kg/m.  Wt Readings from Last 3 Encounters:  10/13/17 187 lb (84.8 kg)  01/29/17 167 lb 1.3 oz (75.8 kg)  09/03/16 163 lb (73.9 kg)     Physical Exam Constitutional: She appears well-developed and well-nourished. No distress.  HENT:  Head: Normocephalic and atraumatic.  Right Ear: External ear normal. Normal ear canal and TM Left Ear: External ear normal.  Normal ear canal and TM Mouth/Throat: Oropharynx is clear and moist.  Eyes: Conjunctivae and EOM are normal.  Neck: Neck supple. No tracheal deviation present. No thyromegaly present.  No carotid bruit  Cardiovascular: Normal rate, regular rhythm and normal heart sounds.   No murmur heard.  No edema. Pulmonary/Chest: Effort normal and breath sounds normal. No respiratory distress. She has no wheezes. She has no rales.  Breast: deferred to Gyn Abdominal: Soft. She exhibits no distension. There is no tenderness.  Lymphadenopathy: She has no cervical adenopathy.  Skin: Skin is warm and dry. She is not diaphoretic.  Psychiatric: She has a normal mood and affect. Her behavior is normal.        Assessment & Plan:   Physical exam: Screening blood work  ordered Immunizations  Flu vaccine today, td up to date Mammogram   Will schedule   Gyn      Up to date  Eye exams  Not up to date Exercise  irregular Weight - advised weight loss Skin   Lesion on right lower eye lid Substance abuse  none  See Problem List for Assessment and Plan of chronic medical problems.  Follow-up yearly

## 2017-10-13 NOTE — Patient Instructions (Addendum)
Test(s) ordered today. Your results will be released to MyChart (or called to you) after review, usually within 72hours after test completion. If any changes need to be made, you will be notified at that same time.  All other Health Maintenance issues reviewed.   All recommended immunizations and age-appropriate screenings are up-to-date or discussed.  Flu immunization administered today.   Medications reviewed and updated.  No changes recommended at this time.   Please followup in one year   Health Maintenance, Female Adopting a healthy lifestyle and getting preventive care can go a long way to promote health and wellness. Talk with your health care provider about what schedule of regular examinations is right for you. This is a good chance for you to check in with your provider about disease prevention and staying healthy. In between checkups, there are plenty of things you can do on your own. Experts have done a lot of research about which lifestyle changes and preventive measures are most likely to keep you healthy. Ask your health care provider for more information. Weight and diet Eat a healthy diet  Be sure to include plenty of vegetables, fruits, low-fat dairy products, and lean protein.  Do not eat a lot of foods high in solid fats, added sugars, or salt.  Get regular exercise. This is one of the most important things you can do for your health. ? Most adults should exercise for at least 150 minutes each week. The exercise should increase your heart rate and make you sweat (moderate-intensity exercise). ? Most adults should also do strengthening exercises at least twice a week. This is in addition to the moderate-intensity exercise.  Maintain a healthy weight  Body mass index (BMI) is a measurement that can be used to identify possible weight problems. It estimates body fat based on height and weight. Your health care provider can help determine your BMI and help you achieve or  maintain a healthy weight.  For females 20 years of age and older: ? A BMI below 18.5 is considered underweight. ? A BMI of 18.5 to 24.9 is normal. ? A BMI of 25 to 29.9 is considered overweight. ? A BMI of 30 and above is considered obese.  Watch levels of cholesterol and blood lipids  You should start having your blood tested for lipids and cholesterol at 41 years of age, then have this test every 5 years.  You may need to have your cholesterol levels checked more often if: ? Your lipid or cholesterol levels are high. ? You are older than 41 years of age. ? You are at high risk for heart disease.  Cancer screening Lung Cancer  Lung cancer screening is recommended for adults 55-80 years old who are at high risk for lung cancer because of a history of smoking.  A yearly low-dose CT scan of the lungs is recommended for people who: ? Currently smoke. ? Have quit within the past 15 years. ? Have at least a 30-pack-year history of smoking. A pack year is smoking an average of one pack of cigarettes a day for 1 year.  Yearly screening should continue until it has been 15 years since you quit.  Yearly screening should stop if you develop a health problem that would prevent you from having lung cancer treatment.  Breast Cancer  Practice breast self-awareness. This means understanding how your breasts normally appear and feel.  It also means doing regular breast self-exams. Let your health care provider know about any changes,   changes, no matter how small.  If you are in your 20s or 30s, you should have a clinical breast exam (CBE) by a health care provider every 1-3 years as part of a regular health exam.  If you are 40 or older, have a CBE every year. Also consider having a breast X-ray (mammogram) every year.  If you have a family history of breast cancer, talk to your health care provider about genetic screening.  If you are at high risk for breast cancer, talk to your health care  provider about having an MRI and a mammogram every year.  Breast cancer gene (BRCA) assessment is recommended for women who have family members with BRCA-related cancers. BRCA-related cancers include: ? Breast. ? Ovarian. ? Tubal. ? Peritoneal cancers.  Results of the assessment will determine the need for genetic counseling and BRCA1 and BRCA2 testing.  Cervical Cancer Your health care provider may recommend that you be screened regularly for cancer of the pelvic organs (ovaries, uterus, and vagina). This screening involves a pelvic examination, including checking for microscopic changes to the surface of your cervix (Pap test). You may be encouraged to have this screening done every 3 years, beginning at age 21.  For women ages 30-65, health care providers may recommend pelvic exams and Pap testing every 3 years, or they may recommend the Pap and pelvic exam, combined with testing for human papilloma virus (HPV), every 5 years. Some types of HPV increase your risk of cervical cancer. Testing for HPV may also be done on women of any age with unclear Pap test results.  Other health care providers may not recommend any screening for nonpregnant women who are considered low risk for pelvic cancer and who do not have symptoms. Ask your health care provider if a screening pelvic exam is right for you.  If you have had past treatment for cervical cancer or a condition that could lead to cancer, you need Pap tests and screening for cancer for at least 20 years after your treatment. If Pap tests have been discontinued, your risk factors (such as having a new sexual partner) need to be reassessed to determine if screening should resume. Some women have medical problems that increase the chance of getting cervical cancer. In these cases, your health care provider may recommend more frequent screening and Pap tests.  Colorectal Cancer  This type of cancer can be detected and often prevented.  Routine  colorectal cancer screening usually begins at 41 years of age and continues through 41 years of age.  Your health care provider may recommend screening at an earlier age if you have risk factors for colon cancer.  Your health care provider may also recommend using home test kits to check for hidden blood in the stool.  A small camera at the end of a tube can be used to examine your colon directly (sigmoidoscopy or colonoscopy). This is done to check for the earliest forms of colorectal cancer.  Routine screening usually begins at age 50.  Direct examination of the colon should be repeated every 5-10 years through 41 years of age. However, you may need to be screened more often if early forms of precancerous polyps or small growths are found.  Skin Cancer  Check your skin from head to toe regularly.  Tell your health care provider about any new moles or changes in moles, especially if there is a change in a mole's shape or color.  Also tell your health care provider if   you have a mole that is larger than the size of a pencil eraser.  Always use sunscreen. Apply sunscreen liberally and repeatedly throughout the day.  Protect yourself by wearing long sleeves, pants, a wide-brimmed hat, and sunglasses whenever you are outside.  Heart disease, diabetes, and high blood pressure  High blood pressure causes heart disease and increases the risk of stroke. High blood pressure is more likely to develop in: ? People who have blood pressure in the high end of the normal range (130-139/85-89 mm Hg). ? People who are overweight or obese. ? People who are African American.  If you are 55-1 years of age, have your blood pressure checked every 3-5 years. If you are 86 years of age or older, have your blood pressure checked every year. You should have your blood pressure measured twice-once when you are at a hospital or clinic, and once when you are not at a hospital or clinic. Record the average of the  two measurements. To check your blood pressure when you are not at a hospital or clinic, you can use: ? An automated blood pressure machine at a pharmacy. ? A home blood pressure monitor.  If you are between 15 years and 81 years old, ask your health care provider if you should take aspirin to prevent strokes.  Have regular diabetes screenings. This involves taking a blood sample to check your fasting blood sugar level. ? If you are at a normal weight and have a low risk for diabetes, have this test once every three years after 41 years of age. ? If you are overweight and have a high risk for diabetes, consider being tested at a younger age or more often. Preventing infection Hepatitis B  If you have a higher risk for hepatitis B, you should be screened for this virus. You are considered at high risk for hepatitis B if: ? You were born in a country where hepatitis B is common. Ask your health care provider which countries are considered high risk. ? Your parents were born in a high-risk country, and you have not been immunized against hepatitis B (hepatitis B vaccine). ? You have HIV or AIDS. ? You use needles to inject street drugs. ? You live with someone who has hepatitis B. ? You have had sex with someone who has hepatitis B. ? You get hemodialysis treatment. ? You take certain medicines for conditions, including cancer, organ transplantation, and autoimmune conditions.  Hepatitis C  Blood testing is recommended for: ? Everyone born from [birth year] through 1965. ? Anyone with known risk factors for hepatitis C.  Sexually transmitted infections (STIs)  You should be screened for sexually transmitted infections (STIs) including gonorrhea and chlamydia if: ? You are sexually active and are younger than 41 years of age. ? You are older than 41 years of age and your health care provider tells you that you are at risk for this type of infection. ? Your sexual activity has changed since you  were last screened and you are at an increased risk for chlamydia or gonorrhea. Ask your health care provider if you are at risk.  If you do not have HIV, but are at risk, it may be recommended that you take a prescription medicine daily to prevent HIV infection. This is called pre-exposure prophylaxis (PrEP). You are considered at risk if: ? You are sexually active and do not regularly use condoms or know the HIV status of your partner(s). ? You take drugs by  injection. ? You are sexually active with a partner who has HIV.  Talk with your health care provider about whether you are at high risk of being infected with HIV. If you choose to begin PrEP, you should first be tested for HIV. You should then be tested every 3 months for as long as you are taking PrEP. Pregnancy  If you are premenopausal and you may become pregnant, ask your health care provider about preconception counseling.  If you may become pregnant, take 400 to 800 micrograms (mcg) of folic acid every day.  If you want to prevent pregnancy, talk to your health care provider about birth control (contraception). Osteoporosis and menopause  Osteoporosis is a disease in which the bones lose minerals and strength with aging. This can result in serious bone fractures. Your risk for osteoporosis can be identified using a bone density scan.  If you are 69 years of age or older, or if you are at risk for osteoporosis and fractures, ask your health care provider if you should be screened.  Ask your health care provider whether you should take a calcium or vitamin D supplement to lower your risk for osteoporosis.  Menopause may have certain physical symptoms and risks.  Hormone replacement therapy may reduce some of these symptoms and risks. Talk to your health care provider about whether hormone replacement therapy is right for you. Follow these instructions at home:  Schedule regular health, dental, and eye exams.  Stay current  with your immunizations.  Do not use any tobacco products including cigarettes, chewing tobacco, or electronic cigarettes.  If you are pregnant, do not drink alcohol.  If you are breastfeeding, limit how much and how often you drink alcohol.  Limit alcohol intake to no more than 1 drink per day for nonpregnant women. One drink equals 12 ounces of beer, 5 ounces of wine, or 1 ounces of hard liquor.  Do not use street drugs.  Do not share needles.  Ask your health care provider for help if you need support or information about quitting drugs.  Tell your health care provider if you often feel depressed.  Tell your health care provider if you have ever been abused or do not feel safe at home. This information is not intended to replace advice given to you by your health care provider. Make sure you discuss any questions you have with your health care provider. Document Released: 06/16/2011 Document Revised: 05/08/2016 Document Reviewed: 09/04/2015 Elsevier Interactive Patient Education  [employer].

## 2017-10-13 NOTE — Assessment & Plan Note (Signed)
Having occaisional migraines  - some of them from neck tightness or weather changes Taking motrin or excedrin migraine as needed Both are effective if she catches it

## 2017-10-13 NOTE — Assessment & Plan Note (Signed)
Taking ASA 81 mg daily

## 2017-11-10 DIAGNOSIS — H02822 Cysts of right lower eyelid: Secondary | ICD-10-CM | POA: Diagnosis not present

## 2018-02-16 DIAGNOSIS — D492 Neoplasm of unspecified behavior of bone, soft tissue, and skin: Secondary | ICD-10-CM | POA: Diagnosis not present

## 2018-02-16 DIAGNOSIS — D23112 Other benign neoplasm of skin of right lower eyelid, including canthus: Secondary | ICD-10-CM | POA: Diagnosis not present

## 2018-02-19 ENCOUNTER — Encounter: Payer: Self-pay | Admitting: *Deleted

## 2018-02-19 ENCOUNTER — Ambulatory Visit (INDEPENDENT_AMBULATORY_CARE_PROVIDER_SITE_OTHER): Payer: 59 | Admitting: Obstetrics & Gynecology

## 2018-02-19 VITALS — BP 124/84 | HR 71 | Wt 191.2 lb

## 2018-02-19 DIAGNOSIS — Z124 Encounter for screening for malignant neoplasm of cervix: Secondary | ICD-10-CM | POA: Diagnosis not present

## 2018-02-19 DIAGNOSIS — Z01419 Encounter for gynecological examination (general) (routine) without abnormal findings: Secondary | ICD-10-CM

## 2018-02-19 DIAGNOSIS — Z1151 Encounter for screening for human papillomavirus (HPV): Secondary | ICD-10-CM | POA: Diagnosis not present

## 2018-02-19 DIAGNOSIS — N920 Excessive and frequent menstruation with regular cycle: Secondary | ICD-10-CM

## 2018-02-19 NOTE — Progress Notes (Signed)
Subjective:    Rhonda Adams is a 42 y.o. married P85 (54 and 67 year old kids) female who presents for an annual exam. Her periods are very heavy and clotting on day #2-3, periods last about 5 days. The patient is sexually active. GYN screening history: last pap: was normal. The patient wears seatbelts: yes. The patient participates in regular exercise: yes. Has the patient ever been transfused or tattooed?: no. The patient reports that there is not domestic violence in her life.   Menstrual History: OB History    Gravida Para Term Preterm AB Living   6 2 0 _0 SAB TAB Ectopic Multiple Live Births   4 0 0 0 2      Menarche age: 20 Patient's last menstrual period was 02/02/2018.    The following portions of the patient's history were reviewed and updated as appropriate: allergies, current medications, past family history, past medical history, past social history, past surgical history and problem list.  Review of Systems Pertinent items are noted in HPI.   Married for 17 years, denies dyspareunia She has had her tubes removed Fh- + breast cancer in mom (diagnosed at 69, died at 65 yo), maybe a maternal great aunt No gyn/colon cancer She had a negative BRCA test   Objective:    BP 124/84   Pulse 71   Wt 191 lb 3.2 oz (86.7 kg)   LMP 02/02/2018   BMI 34.97 kg/m   General Appearance:    Alert, cooperative, no distress, appears stated age  Head:    Normocephalic, without obvious abnormality, atraumatic  Eyes:    PERRL, conjunctiva/corneas clear, EOM's intact, fundi    benign, both eyes  Ears:    Normal TM's and external ear canals, both ears  Nose:   Nares normal, septum midline, mucosa normal, no drainage    or sinus tenderness  Throat:   Lips, mucosa, and tongue normal; teeth and gums normal  Neck:   Supple, symmetrical, trachea midline, no adenopathy;    thyroid:  no enlargement/tenderness/nodules; no carotid   bruit or JVD  Back:     Symmetric, no curvature, ROM normal,  no CVA tenderness  Lungs:     Clear to auscultation bilaterally, respirations unlabored  Chest Wall:    No tenderness or deformity   Heart:    Regular rate and rhythm, S1 and S2 normal, no murmur, rub   or gallop  Breast Exam:    No tenderness, masses, or nipple abnormality  Abdomen:     Soft, non-tender, bowel sounds active all four quadrants,    no masses, no organomegaly  Genitalia:    Normal female without lesion, discharge or tenderness, normal size and shape, anteverted, mobile, non-tender, normal adnexal exam      Extremities:   Extremities normal, atraumatic, no cyanosis or edema  Pulses:   2+ and symmetric all extremities  Skin:   Skin color, texture, turgor normal, no rashes or lesions  Lymph nodes:   Cervical, supraclavicular, and axillary nodes normal  Neurologic:   CNII-XII intact, normal strength, sensation and reflexes    throughout  .    Assessment:    Healthy female exam.   menorrhagia   Plan:     Thin prep Pap smear. with cotesting mammogram Check CBC and gyn u/s Come back 1 month

## 2018-02-19 NOTE — Progress Notes (Signed)
NEEDS MAMMOGRAM  

## 2018-02-22 LAB — CYTOLOGY - PAP
DIAGNOSIS: NEGATIVE
HPV: NOT DETECTED

## 2018-02-23 ENCOUNTER — Other Ambulatory Visit: Payer: 59

## 2018-02-23 DIAGNOSIS — N920 Excessive and frequent menstruation with regular cycle: Secondary | ICD-10-CM | POA: Diagnosis not present

## 2018-02-23 LAB — CBC
HEMATOCRIT: 40.4 % (ref 34.0–46.6)
HEMOGLOBIN: 13 g/dL (ref 11.1–15.9)
MCH: 27.7 pg (ref 26.6–33.0)
MCHC: 32.2 g/dL (ref 31.5–35.7)
MCV: 86 fL (ref 79–97)
Platelets: 216 10*3/uL (ref 150–379)
RBC: 4.69 x10E6/uL (ref 3.77–5.28)
RDW: 13.5 % (ref 12.3–15.4)
WBC: 5.3 10*3/uL (ref 3.4–10.8)

## 2018-02-26 ENCOUNTER — Ambulatory Visit (HOSPITAL_COMMUNITY): Payer: 59

## 2018-03-04 ENCOUNTER — Ambulatory Visit (HOSPITAL_COMMUNITY)
Admission: RE | Admit: 2018-03-04 | Discharge: 2018-03-04 | Disposition: A | Discharge status: Home / Self Care | Payer: 59 | Source: Ambulatory Visit | Attending: Obstetrics & Gynecology | Admitting: Obstetrics & Gynecology

## 2018-03-04 DIAGNOSIS — N920 Excessive and frequent menstruation with regular cycle: Secondary | ICD-10-CM | POA: Diagnosis not present

## 2018-03-05 NOTE — Telephone Encounter (Signed)
-----   Message from Emily Filbert, MD sent at 03/05/2018  8:23 AM EDT ----- She will need a sonohysterogram ordered.  Thanks

## 2018-03-09 ENCOUNTER — Other Ambulatory Visit: Payer: Self-pay | Admitting: *Deleted

## 2018-03-09 DIAGNOSIS — N920 Excessive and frequent menstruation with regular cycle: Secondary | ICD-10-CM

## 2018-03-16 ENCOUNTER — Ambulatory Visit
Admission: RE | Admit: 2018-03-16 | Discharge: 2018-03-16 | Disposition: A | Discharge status: Home / Self Care | Payer: 59 | Source: Ambulatory Visit | Attending: Obstetrics & Gynecology | Admitting: Obstetrics & Gynecology

## 2018-03-16 DIAGNOSIS — Z1231 Encounter for screening mammogram for malignant neoplasm of breast: Secondary | ICD-10-CM | POA: Diagnosis not present

## 2018-03-16 DIAGNOSIS — Z01419 Encounter for gynecological examination (general) (routine) without abnormal findings: Secondary | ICD-10-CM

## 2018-03-17 ENCOUNTER — Other Ambulatory Visit (HOSPITAL_COMMUNITY): Payer: Self-pay | Admitting: Obstetrics & Gynecology

## 2018-03-17 DIAGNOSIS — R928 Other abnormal and inconclusive findings on diagnostic imaging of breast: Secondary | ICD-10-CM

## 2018-03-19 ENCOUNTER — Ambulatory Visit: Payer: 59 | Admitting: Obstetrics & Gynecology

## 2018-03-23 ENCOUNTER — Ambulatory Visit
Admission: RE | Admit: 2018-03-23 | Discharge: 2018-03-23 | Disposition: A | Discharge status: Home / Self Care | Payer: 59 | Source: Ambulatory Visit | Attending: Obstetrics & Gynecology | Admitting: Obstetrics & Gynecology

## 2018-03-23 ENCOUNTER — Other Ambulatory Visit (HOSPITAL_COMMUNITY): Payer: Self-pay | Admitting: Obstetrics & Gynecology

## 2018-03-23 DIAGNOSIS — R928 Other abnormal and inconclusive findings on diagnostic imaging of breast: Secondary | ICD-10-CM

## 2018-03-23 DIAGNOSIS — N6489 Other specified disorders of breast: Secondary | ICD-10-CM | POA: Diagnosis not present

## 2018-03-23 DIAGNOSIS — R922 Inconclusive mammogram: Secondary | ICD-10-CM | POA: Diagnosis not present

## 2018-03-23 DIAGNOSIS — N6310 Unspecified lump in the right breast, unspecified quadrant: Secondary | ICD-10-CM

## 2018-03-23 DIAGNOSIS — N6321 Unspecified lump in the left breast, upper outer quadrant: Secondary | ICD-10-CM | POA: Diagnosis not present

## 2018-03-30 ENCOUNTER — Other Ambulatory Visit (HOSPITAL_COMMUNITY): Payer: Self-pay | Admitting: Obstetrics & Gynecology

## 2018-03-30 ENCOUNTER — Ambulatory Visit
Admission: RE | Admit: 2018-03-30 | Discharge: 2018-03-30 | Disposition: A | Discharge status: Home / Self Care | Payer: 59 | Source: Ambulatory Visit | Attending: Obstetrics & Gynecology | Admitting: Obstetrics & Gynecology

## 2018-03-30 ENCOUNTER — Other Ambulatory Visit: Payer: Self-pay | Admitting: Diagnostic Radiology

## 2018-03-30 DIAGNOSIS — N6310 Unspecified lump in the right breast, unspecified quadrant: Secondary | ICD-10-CM

## 2018-03-30 DIAGNOSIS — D241 Benign neoplasm of right breast: Secondary | ICD-10-CM | POA: Diagnosis not present

## 2018-03-30 DIAGNOSIS — N6312 Unspecified lump in the right breast, upper inner quadrant: Secondary | ICD-10-CM | POA: Diagnosis not present

## 2018-03-31 ENCOUNTER — Ambulatory Visit (HOSPITAL_COMMUNITY)
Admission: RE | Admit: 2018-03-31 | Discharge: 2018-03-31 | Disposition: A | Discharge status: Home / Self Care | Payer: 59 | Source: Ambulatory Visit | Attending: Obstetrics & Gynecology | Admitting: Obstetrics & Gynecology

## 2018-03-31 DIAGNOSIS — N920 Excessive and frequent menstruation with regular cycle: Secondary | ICD-10-CM | POA: Diagnosis present

## 2018-04-30 ENCOUNTER — Ambulatory Visit: Payer: 59 | Admitting: Obstetrics & Gynecology

## 2018-04-30 ENCOUNTER — Ambulatory Visit (INDEPENDENT_AMBULATORY_CARE_PROVIDER_SITE_OTHER): Payer: 59 | Admitting: Obstetrics & Gynecology

## 2018-04-30 ENCOUNTER — Encounter: Payer: Self-pay | Admitting: Obstetrics & Gynecology

## 2018-04-30 VITALS — BP 124/88 | HR 72 | Wt 195.4 lb

## 2018-04-30 DIAGNOSIS — N920 Excessive and frequent menstruation with regular cycle: Secondary | ICD-10-CM

## 2018-04-30 NOTE — Progress Notes (Signed)
   Subjective:    Patient ID: Rhonda Adams, female    DOB: 07-24-1976, 42 y.o.   MRN: [id number]  HPI  42 yo married P2 here for follow up for her menorrhagia with regular cycle. Her sonohysterogram and CBC were normal.  Review of Systems Pap smear normal 3/19    Objective:   Physical Exam Breathing, conversing, and ambulating normally Well nourished, well hydrated White female, no apparent distress     Assessment & Plan:  DUB- offered OCPs, Mirena, and d&c with endometrial ablation She is considering these options and will let me know what she chooses.

## 2018-10-20 ENCOUNTER — Encounter (HOSPITAL_COMMUNITY): Payer: Self-pay

## 2018-12-21 ENCOUNTER — Encounter (HOSPITAL_BASED_OUTPATIENT_CLINIC_OR_DEPARTMENT_OTHER): Payer: Self-pay | Admitting: *Deleted

## 2018-12-21 ENCOUNTER — Other Ambulatory Visit: Payer: Self-pay

## 2018-12-29 ENCOUNTER — Ambulatory Visit (HOSPITAL_BASED_OUTPATIENT_CLINIC_OR_DEPARTMENT_OTHER): Payer: 59 | Admitting: Anesthesiology

## 2018-12-29 ENCOUNTER — Ambulatory Visit (HOSPITAL_BASED_OUTPATIENT_CLINIC_OR_DEPARTMENT_OTHER)
Admission: RE | Admit: 2018-12-29 | Discharge: 2018-12-29 | Disposition: A | Discharge status: Home / Self Care | Payer: 59 | Source: Ambulatory Visit | Attending: Obstetrics & Gynecology | Admitting: Obstetrics & Gynecology

## 2018-12-29 ENCOUNTER — Other Ambulatory Visit: Payer: Self-pay

## 2018-12-29 ENCOUNTER — Encounter (HOSPITAL_BASED_OUTPATIENT_CLINIC_OR_DEPARTMENT_OTHER): Payer: Self-pay

## 2018-12-29 ENCOUNTER — Encounter (HOSPITAL_BASED_OUTPATIENT_CLINIC_OR_DEPARTMENT_OTHER)
Admission: RE | Disposition: A | Discharge status: Home / Self Care | Payer: Self-pay | Source: Ambulatory Visit | Attending: Obstetrics & Gynecology

## 2018-12-29 DIAGNOSIS — N840 Polyp of corpus uteri: Secondary | ICD-10-CM | POA: Insufficient documentation

## 2018-12-29 DIAGNOSIS — Z7982 Long term (current) use of aspirin: Secondary | ICD-10-CM | POA: Insufficient documentation

## 2018-12-29 DIAGNOSIS — R51 Headache: Secondary | ICD-10-CM | POA: Insufficient documentation

## 2018-12-29 DIAGNOSIS — N938 Other specified abnormal uterine and vaginal bleeding: Secondary | ICD-10-CM | POA: Insufficient documentation

## 2018-12-29 HISTORY — PX: DILATION AND CURETTAGE OF UTERUS: SHX78

## 2018-12-29 LAB — POCT PREGNANCY, URINE: Preg Test, Ur: NEGATIVE

## 2018-12-29 SURGERY — DILATION AND CURETTAGE
Anesthesia: General | Site: Vagina

## 2018-12-29 MED ORDER — SCOPOLAMINE 1 MG/3DAYS TD PT72: 1.0000 | MEDICATED_PATCH | Freq: Once | TRANSDERMAL | Status: DC | PRN

## 2018-12-29 MED ORDER — BUPIVACAINE HCL (PF) 0.5 % IJ SOLN
INTRAMUSCULAR | Status: DC | PRN
  Administered 2018-12-29: 30 mL

## 2018-12-29 MED ORDER — MIDAZOLAM HCL 2 MG/2ML IJ SOLN
INTRAMUSCULAR | Status: AC
  Filled 2018-12-29: qty 2

## 2018-12-29 MED ORDER — MIDAZOLAM HCL 2 MG/2ML IJ SOLN
1.0000 mg | INTRAMUSCULAR | Status: DC | PRN
  Administered 2018-12-29: 2 mg via INTRAVENOUS

## 2018-12-29 MED ORDER — FENTANYL CITRATE (PF) 100 MCG/2ML IJ SOLN
50.0000 ug | INTRAMUSCULAR | Status: DC | PRN
  Administered 2018-12-29 (×2): 50 ug via INTRAVENOUS

## 2018-12-29 MED ORDER — MEPERIDINE HCL 25 MG/ML IJ SOLN: 6.2500 mg | INTRAMUSCULAR | Status: DC | PRN

## 2018-12-29 MED ORDER — OXYCODONE-ACETAMINOPHEN 5-325 MG PO TABS: 1.0000 | ORAL_TABLET | Freq: Four times a day (QID) | ORAL | 0 refills | Status: DC | PRN

## 2018-12-29 MED ORDER — OXYCODONE HCL 5 MG/5ML PO SOLN: 5.0000 mg | Freq: Once | ORAL | Status: DC | PRN

## 2018-12-29 MED ORDER — LACTATED RINGERS IV SOLN
INTRAVENOUS | Status: DC
  Administered 2018-12-29: 13:00:00 via INTRAVENOUS

## 2018-12-29 MED ORDER — DEXAMETHASONE SODIUM PHOSPHATE 10 MG/ML IJ SOLN
INTRAMUSCULAR | Status: AC
  Filled 2018-12-29: qty 1

## 2018-12-29 MED ORDER — ONDANSETRON HCL 4 MG/2ML IJ SOLN
INTRAMUSCULAR | Status: DC | PRN
  Administered 2018-12-29: 4 mg via INTRAVENOUS

## 2018-12-29 MED ORDER — LIDOCAINE HCL (CARDIAC) PF 100 MG/5ML IV SOSY
PREFILLED_SYRINGE | INTRAVENOUS | Status: DC | PRN
  Administered 2018-12-29: 80 mg via INTRAVENOUS

## 2018-12-29 MED ORDER — PROPOFOL 10 MG/ML IV BOLUS
INTRAVENOUS | Status: DC | PRN
  Administered 2018-12-29: 50 mg via INTRAVENOUS
  Administered 2018-12-29: 150 mg via INTRAVENOUS

## 2018-12-29 MED ORDER — OXYCODONE HCL 5 MG PO TABS: 5.0000 mg | ORAL_TABLET | Freq: Once | ORAL | Status: DC | PRN

## 2018-12-29 MED ORDER — ONDANSETRON HCL 4 MG/2ML IJ SOLN
INTRAMUSCULAR | Status: AC
  Filled 2018-12-29: qty 2

## 2018-12-29 MED ORDER — LIDOCAINE 2% (20 MG/ML) 5 ML SYRINGE
INTRAMUSCULAR | Status: AC
  Filled 2018-12-29: qty 5

## 2018-12-29 MED ORDER — PROMETHAZINE HCL 25 MG/ML IJ SOLN: 6.2500 mg | INTRAMUSCULAR | Status: DC | PRN

## 2018-12-29 MED ORDER — FENTANYL CITRATE (PF) 100 MCG/2ML IJ SOLN: 25.0000 ug | INTRAMUSCULAR | Status: DC | PRN

## 2018-12-29 MED ORDER — DEXAMETHASONE SODIUM PHOSPHATE 4 MG/ML IJ SOLN
INTRAMUSCULAR | Status: DC | PRN
  Administered 2018-12-29: 10 mg via INTRAVENOUS

## 2018-12-29 MED ORDER — FENTANYL CITRATE (PF) 100 MCG/2ML IJ SOLN
INTRAMUSCULAR | Status: AC
  Filled 2018-12-29: qty 2

## 2018-12-29 SURGICAL SUPPLY — 25 items
BIPOLAR CUTTING LOOP 21FR (ELECTRODE)
BRIEF STRETCH FOR OB PAD XXL (UNDERPADS AND DIAPERS) ×4 IMPLANT
CANISTER SUCT 3000ML PPV (MISCELLANEOUS) ×4 IMPLANT
DILATOR CANAL MILEX (MISCELLANEOUS) IMPLANT
ELECT REM PT RETURN 9FT ADLT (ELECTROSURGICAL)
ELECTRODE REM PT RTRN 9FT ADLT (ELECTROSURGICAL) IMPLANT
GAS ARGON MINERVA CANISTER (MEDICAL GASES) ×2 IMPLANT
GLOVE BIO SURGEON STRL SZ 6.5 (GLOVE) ×3 IMPLANT
GLOVE BIO SURGEON STRL SZ7 (GLOVE) ×2 IMPLANT
GLOVE BIO SURGEONS STRL SZ 6.5 (GLOVE) ×1
GLOVE BIOGEL PI IND STRL 7.0 (GLOVE) ×2 IMPLANT
GLOVE BIOGEL PI IND STRL 7.5 (GLOVE) IMPLANT
GLOVE BIOGEL PI INDICATOR 7.0 (GLOVE) ×4
GLOVE BIOGEL PI INDICATOR 7.5 (GLOVE) ×2
GOWN STRL REUS W/ TWL XL LVL3 (GOWN DISPOSABLE) ×2 IMPLANT
GOWN STRL REUS W/TWL LRG LVL3 (GOWN DISPOSABLE) ×4 IMPLANT
GOWN STRL REUS W/TWL XL LVL3 (GOWN DISPOSABLE) ×4
HANDPIECE ABLA MINERVA ENDO (MISCELLANEOUS) ×2 IMPLANT
KIT PROCEDURE FLUENT (KITS) ×2 IMPLANT
LOOP CUTTING BIPOLAR 21FR (ELECTRODE) IMPLANT
PACK VAGINAL MINOR WOMEN LF (CUSTOM PROCEDURE TRAY) ×4 IMPLANT
PAD OB MATERNITY 4.3X12.25 (PERSONAL CARE ITEMS) ×4 IMPLANT
PAD PREP 24X48 CUFFED NSTRL (MISCELLANEOUS) ×4 IMPLANT
SLEEVE SCD COMPRESS KNEE MED (MISCELLANEOUS) ×4 IMPLANT
TOWEL GREEN STERILE FF (TOWEL DISPOSABLE) ×8 IMPLANT

## 2018-12-29 NOTE — H&P (Signed)
Rhonda Adams is an 43 y.o. married P2 here for a d&c and endometrial ablation. She has had menorrhagia with regular cycle but this is challenging her lifestyle. A sonohysterogram and CBC were normal. She has had a BTL and doesn't want more kids.   Patient's last menstrual period was 12/01/2018 (exact date).    Past Medical History:  Diagnosis Date  . Amniotic fluid embolism during pregnancy   . Broken ankle   . DIC (disseminated intravascular coagulation) ([place]) 2012  . Migraine   . Ovarian cyst   . Preterm labor 2012    Past Surgical History:  Procedure Laterality Date  . ARTHROSCOPIC REPAIR ACL     left  . DILATION AND CURETTAGE OF UTERUS    . DILATION AND EVACUATION N/A 09/04/2013   Procedure: exam under anesthesia placement Bakri balloon;  Surgeon: Woodroe Mode, MD;  Location: [facility];  Service: Gynecology;  Laterality: N/A;  . LAPAROSCOPIC BILATERAL SALPINGECTOMY Bilateral 04/22/2016   Procedure: LAPAROSCOPIC BILATERAL SALPINGECTOMY;  Surgeon: Emily Filbert, MD;  Location: [facility];  Service: Gynecology;  Laterality: Bilateral;  . MISCARRIAGE    . ORIF ANKLE FRACTURE Right 10/07/2014   Procedure: OPEN REDUCTION INTERNAL FIXATION (ORIF) ANKLE FRACTURE;  Surgeon: Meredith Pel, MD;  Location: [facility];  Service: Orthopedics;  Laterality: Right;    Family History  Problem Relation Age of Onset  . Cancer Mother 55       BREAST  . Breast cancer Mother   . Hypertension Father   . Heart disease Maternal Grandmother   . Diabetes Maternal Grandfather   . Stroke Paternal Grandfather   . Hearing loss Neg Hx     Social History:  reports that she has never smoked. She has never used smokeless tobacco. She reports current alcohol use. She reports that she does not use drugs.  Allergies:  Allergies  Allergen Reactions  . Berniece Salines Flavor Nausea And Vomiting    Pt states she is allergic to bacon; not bacon flavor.    Medications Prior to Admission  Medication Sig Dispense Refill Last  Dose  . aspirin EC 81 MG tablet Take 81 mg by mouth daily.   Past Week at Unknown time  . ibuprofen (ADVIL,MOTRIN) 200 MG tablet Take 400 mg by mouth every 6 (six) hours as needed for headache or moderate pain.   12/28/2018 at Unknown time  . Multiple Vitamins-Minerals (MULTIVITAMIN WITH MINERALS) tablet Take 1 tablet by mouth daily.   Past Week at Unknown time    ROS  Blood pressure 119/70, pulse 74, temperature 97.9 F (36.6 C), temperature source Oral, resp. rate 18, height 5\' 5"  (1.651 m), weight 91.2 kg, last menstrual period 12/01/2018, SpO2 100 %. Physical Exam  Breathing, conversing, and ambulating normally Well nourished, well hydrated White female, no apparent distress Heart- rrr Lungs- CTAB Abd- benign  Results for orders placed or performed during the hospital encounter of 12/29/18 (from the past 24 hour(s))  Pregnancy, urine POC     Status: None   Collection Time: 12/29/18 12:09 PM  Result Value Ref Range   Preg Test, Ur NEGATIVE NEGATIVE    No results found.  Assessment/Plan: Menorrhagia- plan for d&c and Minerva endometrial ablation  She understands the risks of surgery, including, but not to infection, bleeding, DVTs, damage to bowel, bladder, ureters. She wishes to proceed.     Ming Mcmannis C Ravonda Brecheen 12/29/2018, 1:00 PM

## 2018-12-29 NOTE — Anesthesia Postprocedure Evaluation (Signed)
Anesthesia Post Note  Patient: Rhonda Adams  Procedure(s) Performed: DILATATION AND CURETTAGE WITH MINERVA ABLATION ATTEMPTED (N/A Vagina )     Patient location during evaluation: PACU Anesthesia Type: General Level of consciousness: sedated and patient cooperative Pain management: pain level controlled Vital Signs Assessment: post-procedure vital signs reviewed and stable Respiratory status: spontaneous breathing Cardiovascular status: stable Anesthetic complications: no    Last Vitals:  Vitals:   12/29/18 1415 12/29/18 1430  BP: 139/86 125/81  Pulse: 70 77  Resp: 19 16  Temp:  36.7 C  SpO2: 95% 98%    Last Pain:  Vitals:   12/29/18 1430  TempSrc:   PainSc: 0-No pain                 Nolon Nations

## 2018-12-29 NOTE — Op Note (Addendum)
12/29/2018  1:54 PM  PATIENT:  Rhonda Adams  43 y.o. female  PRE-OPERATIVE DIAGNOSIS:  DUB  POST-OPERATIVE DIAGNOSIS:  Dysfunctional uterine bleeding  PROCEDURE:  DILATION AND CURETTAGE, ATTEMPTED ENDOMETRIAL ABLATION  SURGEON:  Surgeon(s) and Role:    * Kano Heckmann, Wilhemina Cash, MD - Primary  PHYSICIAN ASSISTANT:   ASSISTANTS: none   ANESTHESIA:   local and general  EBL:  minimal   BLOOD ADMINISTERED:none  DRAINS: none   LOCAL MEDICATIONS USED:  MARCAINE     SPECIMEN:  Source of Specimen:  Uterine curettings  DISPOSITION OF SPECIMEN:  PATHOLOGY  COUNTS:  YES  TOURNIQUET:  * No tourniquets in log *  DICTATION: .Dragon Dictation  PLAN OF CARE: Discharge to home after PACU  PATIENT DISPOSITION:  PACU - hemodynamically stable.   Delay start of Pharmacological VTE agent (>24hrs) due to surgical blood loss or risk of bleeding: yes    The risks, benefits, and alternatives of surgery were explained, understood, and accepted. All questions were answered. Consents were signed. In the operating room  anesthesia was applied without complication, and she was placed in the dorsal lithotomy position. Her vagina was prepped and draped in the usual sterile fashion.  A bimanual exam revealed a very small, anteverted mobile uterus. Her uterus had a moderate amount of desensus with traction and she has a wide pubic arch. Her adnexa were nonenlarged. A speculum was placed and a single-tooth tenaculum was used to grasp the anterior lip of her cervix. A total of 30 mL of 0.5% Marcaine was used to perform a paracervical block. Her uterus sounded to 7.5 cm. Her cervix was carefully and slowly dilated to accommodate a small curette. A curettage was done in all quadrants and the fundus of the uterus. A small amount of tissue was obtained. I then placed the Minerva device with the cervical length being 4 cm. The balloon was still outside of the cervix in spite of multiple attempts to get it into the cervix. I  then repeated the curettage until a gritty sensation was appreciated throughout. There was no bleeding noted at the end of the case. She was taken to the recovery room after being extubated. She tolerated the procedure well.

## 2018-12-29 NOTE — Anesthesia Procedure Notes (Signed)
Procedure Name: LMA Insertion Date/Time: 12/29/2018 1:13 PM Performed by: Lyndee Leo, CRNA Pre-anesthesia Checklist: Patient identified, Emergency Drugs available, Suction available and Patient being monitored Patient Re-evaluated:Patient Re-evaluated prior to induction Oxygen Delivery Method: Circle system utilized Preoxygenation: Pre-oxygenation with 100% oxygen Induction Type: IV induction Ventilation: Mask ventilation without difficulty LMA: LMA inserted LMA Size: 4.0 Number of attempts: 1 Airway Equipment and Method: Bite block Placement Confirmation: positive ETCO2 Tube secured with: Tape Dental Injury: Teeth and Oropharynx as per pre-operative assessment

## 2018-12-29 NOTE — Discharge Instructions (Signed)
Dilation and Curettage or Vacuum Curettage, Care After °This sheet gives you information about how to care for yourself after your procedure. Your health care provider may also give you more specific instructions. If you have problems or questions, contact your health care provider. °What can I expect after the procedure? °After your procedure, it is common to have: °· Mild pain or cramping. °· Some vaginal bleeding or spotting. °These may last for up to 2 weeks after your procedure. °Follow these instructions at home: °Activity ° °· Do not drive or use heavy machinery while taking prescription pain medicine. °· Avoid driving for the first 24 hours after your procedure. °· Take frequent, short walks, followed by rest periods, throughout the day. Ask your health care provider what activities are safe for you. After 1-2 days, you may be able to return to your normal activities. °· Do not lift anything heavier than 10 lb (4.5 kg) until your health care provider approves. °· For at least 2 weeks, or as long as told by your health care provider, do not: °? Douche. °? Use tampons. °? Have sexual intercourse. °General instructions ° °· Take over-the-counter and prescription medicines only as told by your health care provider. This is especially important if you take blood thinning medicine. °· Do not take baths, swim, or use a hot tub until your health care provider approves. Take showers instead of baths. °· Wear compression stockings as told by your health care provider. These stockings help to prevent blood clots and reduce swelling in your legs. °· It is your responsibility to get the results of your procedure. Ask your health care provider, or the department performing the procedure, when your results will be ready. °· Keep all follow-up visits as told by your health care provider. This is important. °Contact a health care provider if: °· You have severe cramps that get worse or that do not get better with  medicine. °· You have severe abdominal pain. °· You cannot drink fluids without vomiting. °· You develop pain in a different area of your pelvis. °· You have bad-smelling vaginal discharge. °· You have a rash. °Get help right away if: °· You have vaginal bleeding that soaks more than one sanitary pad in 1 hour, for 2 hours in a row. °· You pass large blood clots from your vagina. °· You have a fever that is above 100.4°F (38.0°C). °· Your abdomen feels very tender or hard. °· You have chest pain. °· You have shortness of breath. °· You cough up blood. °· You feel dizzy or light-headed. °· You faint. °· You have pain in your neck or shoulder area. °This information is not intended to replace advice given to you by your health care provider. Make sure you discuss any questions you have with your health care provider. °Document Released: 11/28/2000 Document Revised: 07/30/2016 Document Reviewed: 07/03/2016 °Elsevier Interactive Patient Education © 2019 [employer] ° ° ° ° °Post Anesthesia Home Care Instructions ° °Activity: °Get plenty of rest for the remainder of the day. A responsible individual must stay with you for 24 hours following the procedure.  °For the next 24 hours, DO NOT: °-Drive a car °-Operate machinery °-Drink alcoholic beverages °-Take any medication unless instructed by your physician °-Make any legal decisions or sign important papers. ° °Meals: °Start with liquid foods such as gelatin or soup. Progress to regular foods as tolerated. Avoid greasy, spicy, heavy foods. If nausea and/or vomiting occur, drink only clear liquids until the   nausea and/or vomiting subsides. Call your physician if vomiting continues. ° °Special Instructions/Symptoms: °Your throat may feel dry or sore from the anesthesia or the breathing tube placed in your throat during surgery. If this causes discomfort, gargle with warm salt water. The discomfort should disappear within 24 hours. ° °If you had a scopolamine patch placed  behind your ear for the management of post- operative nausea and/or vomiting: ° °1. The medication in the patch is effective for 72 hours, after which it should be removed.  Wrap patch in a tissue and discard in the trash. Wash hands thoroughly with soap and water. °2. You may remove the patch earlier than 72 hours if you experience unpleasant side effects which may include dry mouth, dizziness or visual disturbances. °3. Avoid touching the patch. Wash your hands with soap and water after contact with the patch. °   ° °

## 2018-12-29 NOTE — Anesthesia Preprocedure Evaluation (Signed)
Anesthesia Evaluation  Patient identified by MRN, date of birth, ID band Patient awake    Reviewed: Allergy & Precautions, NPO status , Patient's Chart, lab work & pertinent test results  Airway Mallampati: II  TM Distance: >3 FB Neck ROM: Full    Dental  (+) Teeth Intact, Dental Advisory Given   Pulmonary neg pulmonary ROS,    breath sounds clear to auscultation       Cardiovascular negative cardio ROS   Rhythm:Regular Rate:Normal     Neuro/Psych  Headaches, negative psych ROS   GI/Hepatic negative GI ROS, Neg liver ROS,   Endo/Other  negative endocrine ROS  Renal/GU negative Renal ROS     Musculoskeletal negative musculoskeletal ROS (+)   Abdominal (+) + obese,   Peds  Hematology negative hematology ROS (+)   Anesthesia Other Findings   Reproductive/Obstetrics negative OB ROS                             Anesthesia Physical  Anesthesia Plan  ASA: II  Anesthesia Plan: General   Post-op Pain Management:    Induction: Intravenous  PONV Risk Score and Plan: 4 or greater and Ondansetron, Dexamethasone, Midazolam and Treatment may vary due to age or medical condition  Airway Management Planned: LMA  Additional Equipment: None  Intra-op Plan:   Post-operative Plan: Extubation in OR  Informed Consent: I have reviewed the patients History and Physical, chart, labs and discussed the procedure including the risks, benefits and alternatives for the proposed anesthesia with the patient or authorized representative who has indicated his/her understanding and acceptance.     Dental advisory given  Plan Discussed with: CRNA  Anesthesia Plan Comments:         Anesthesia Quick Evaluation

## 2018-12-29 NOTE — Transfer of Care (Signed)
Immediate Anesthesia Transfer of Care Note  Patient: Alejandro Mulling  Procedure(s) Performed: DILATATION AND CURETTAGE WITH MINERVA ABLATION (N/A Vagina )  Patient Location: PACU  Anesthesia Type:General  Level of Consciousness: sedated and responds to stimulation  Airway & Oxygen Therapy: Patient Spontanous Breathing and Patient connected to face mask oxygen  Post-op Assessment: Report given to RN and Post -op Vital signs reviewed and stable  Post vital signs: Reviewed and stable  Last Vitals:  Vitals Value Taken Time  BP    Temp    Pulse 80 12/29/2018  1:55 PM  Resp    SpO2 100 % 12/29/2018  1:55 PM  Vitals shown include unvalidated device data.  Last Pain:  Vitals:   12/29/18 1224  TempSrc: Oral  PainSc: 0-No pain         Complications: No apparent anesthesia complications

## 2018-12-30 ENCOUNTER — Encounter (HOSPITAL_BASED_OUTPATIENT_CLINIC_OR_DEPARTMENT_OTHER): Payer: Self-pay | Admitting: Obstetrics & Gynecology

## 2019-02-08 ENCOUNTER — Ambulatory Visit (INDEPENDENT_AMBULATORY_CARE_PROVIDER_SITE_OTHER): Payer: 59 | Admitting: Obstetrics & Gynecology

## 2019-02-08 ENCOUNTER — Encounter: Payer: Self-pay | Admitting: Obstetrics & Gynecology

## 2019-02-08 VITALS — BP 129/81 | HR 72 | Wt 207.4 lb

## 2019-02-08 DIAGNOSIS — Z1231 Encounter for screening mammogram for malignant neoplasm of breast: Secondary | ICD-10-CM | POA: Diagnosis not present

## 2019-02-08 DIAGNOSIS — Z09 Encounter for follow-up examination after completed treatment for conditions other than malignant neoplasm: Secondary | ICD-10-CM

## 2019-02-08 DIAGNOSIS — Z9889 Other specified postprocedural states: Secondary | ICD-10-CM

## 2019-02-08 NOTE — Progress Notes (Signed)
Subjective:     Rhonda Adams is a 43 y.o. [phone] female who presents to the clinic status post [place], [place] on 12/29/2018. Since then, she reports mild bleeding. Also reports some itching and irritation around upper vulva near clitoral area; this happened after previous procedure too. It is getting better though. No abnormal discharge. Pain is controlled on Ibuprofen.   The following portions of the patient's history were reviewed and updated as appropriate: allergies, current medications, past family history, past medical history, past social history, past surgical history and problem list.  Review of Systems Pertinent items noted in HPI and remainder of comprehensive ROS otherwise negative.    Objective:    BP 129/81   Pulse 72   Wt 207 lb 6.4 oz (94.1 kg)   BMI 34.51 kg/m  General:  alert and no distress  Abdomen: soft, bowel sounds active, non-tender  Pelvic:   no erythema or lesions noted in upper vulva area, no abnormal discharge    Pathology Endometrium, curettage - BENIGN ENDOMETRIAL POLYP. - NEGATIVE FOR HYPERPLASIA OR MALIGNANCY.    Assessment:    Doing well postoperatively. Operative findings again reviewed.  Negative pathology.    Plan:   1. Continue any current medications. 2. Recommended hydrocortisone cream as needed for vulvar itching 3. Activity restrictions: none 4. Anticipated return to work: not applicable. 5. Annual mammogram scheduled, she is up to date on her pap (normal with negative HPV on 02/19/18) 6. Follow up as needed.    Verita Schneiders, MD, [place] for [employer], [place]

## 2019-02-08 NOTE — Patient Instructions (Signed)
Return to clinic for any scheduled appointments or for any gynecologic concerns as needed.   

## 2019-04-01 ENCOUNTER — Ambulatory Visit: Payer: 59

## 2019-05-23 ENCOUNTER — Other Ambulatory Visit: Payer: Self-pay

## 2019-05-23 ENCOUNTER — Ambulatory Visit
Admission: RE | Admit: 2019-05-23 | Discharge: 2019-05-23 | Disposition: A | Discharge status: Home / Self Care | Payer: 59 | Source: Ambulatory Visit | Attending: Obstetrics & Gynecology | Admitting: Obstetrics & Gynecology

## 2019-05-23 DIAGNOSIS — Z1231 Encounter for screening mammogram for malignant neoplasm of breast: Secondary | ICD-10-CM

## 2019-09-13 NOTE — Progress Notes (Signed)
Subjective:    Patient ID: Rhonda Adams, female    DOB: 1976/05/31, 43 y.o.   MRN: [id number]  HPI She is here for a physical exam.   She is having more stress headaches in the back on her neck and head.  She has a neck massager than help.  Advil helps.  She has not tried anything topical.  Is having more stress/anxiety.  She denies depression.  Her children are the major source.  Her son has ADHD and her daughter has sensory processing issues.  Both for more recent diagnoses.  They are currently doing in-person schooling.  She is having increased anxiety.  She is walking and trying to increase her walking, which helps.  There has been issues with her son at school because of his ADHD.    Medications and allergies reviewed with patient and updated if appropriate.  Patient Active Problem List   Diagnosis Date Noted  . Deviated septum 12/30/2016  . MTHFR mutation ([place]) 03/25/2016  . DIC (disseminated intravascular coagulation) ([place]) 09/07/2013  . Migraine     Current Outpatient Medications on File Prior to Visit  Medication Sig Dispense Refill  . Ascorbic Acid (VITAMIN C) 100 MG tablet Take 100 mg by mouth daily.    Marland Kitchen aspirin EC 81 MG tablet Take 81 mg by mouth daily.    . cholecalciferol (VITAMIN D3) 25 MCG (1000 UT) tablet Take 1,000 Units by mouth daily.    Marland Kitchen ibuprofen (ADVIL,MOTRIN) 200 MG tablet Take 400 mg by mouth every 6 (six) hours as needed for headache or moderate pain.    . Multiple Vitamins-Minerals (MULTIVITAMIN WITH MINERALS) tablet Take 1 tablet by mouth daily.     No current facility-administered medications on file prior to visit.     Past Medical History:  Diagnosis Date  . Amniotic fluid embolism during pregnancy   . Broken ankle   . DIC (disseminated intravascular coagulation) ([place]) 2012  . Migraine   . Ovarian cyst   . Preterm labor 2012    Past Surgical History:  Procedure Laterality Date  . ARTHROSCOPIC REPAIR ACL     left  . DILATION AND  CURETTAGE OF UTERUS    . DILATION AND CURETTAGE OF UTERUS N/A 12/29/2018   Procedure: DILATATION AND CURETTAGE WITH ATTEMPTED ENDOMETRIAL MINERVA ABLATION;  Surgeon: Emily Filbert, MD;  Location: [facility];  Service: Gynecology;  Laterality: N/A;  . DILATION AND EVACUATION N/A 09/04/2013   Procedure: exam under anesthesia placement Bakri balloon;  Surgeon: Woodroe Mode, MD;  Location: [facility];  Service: Gynecology;  Laterality: N/A;  . LAPAROSCOPIC BILATERAL SALPINGECTOMY Bilateral 04/22/2016   Procedure: LAPAROSCOPIC BILATERAL SALPINGECTOMY;  Surgeon: Emily Filbert, MD;  Location: [facility];  Service: Gynecology;  Laterality: Bilateral;  . MISCARRIAGE    . ORIF ANKLE FRACTURE Right 10/07/2014   Procedure: OPEN REDUCTION INTERNAL FIXATION (ORIF) ANKLE FRACTURE;  Surgeon: Meredith Pel, MD;  Location: [facility];  Service: Orthopedics;  Laterality: Right;    Social History   Socioeconomic History  . Marital status: Married    Spouse name: Not on file  . Number of children: 2  . Years of education: Not on file  . Highest education level: Not on file  Occupational History  . Occupation: Licensed conveyancer  Social Needs  . Financial resource strain: Not on file  . Food insecurity    Worry: Not on file    Inability: Not on file  . Transportation needs  Medical: Not on file    Non-medical: Not on file  Tobacco Use  . Smoking status: Never Smoker  . Smokeless tobacco: Never Used  Substance and Sexual Activity  . Alcohol use: Yes    Comment: rare  . Drug use: No  . Sexual activity: Yes    Partners: Male    Birth control/protection: Pill, Surgical  Lifestyle  . Physical activity    Days per week: Not on file    Minutes per session: Not on file  . Stress: Not on file  Relationships  . Social Herbalist on phone: Not on file    Gets together: Not on file    Attends religious service: Not on file    Active member of club or organization: Not on file    Attends  meetings of clubs or organizations: Not on file    Relationship status: Not on file  Other Topics Concern  . Not on file  Social History Narrative   No regular exercise    Family History  Problem Relation Age of Onset  . Cancer Mother 58       BREAST  . Breast cancer Mother   . Hypertension Father   . Heart disease Maternal Grandmother   . Diabetes Maternal Grandfather   . Stroke Paternal Grandfather   . Hearing loss Neg Hx     Review of Systems  Constitutional: Negative for chills and fever.  HENT: Positive for postnasal drip (clearing throat).   Eyes: Negative for visual disturbance.  Respiratory: Negative for cough, shortness of breath and wheezing.   Cardiovascular: Positive for palpitations (stres related). Negative for chest pain and leg swelling.  Gastrointestinal: Negative for abdominal pain, blood in stool, constipation, diarrhea and nausea.       Rare gerd  Genitourinary: Negative for dysuria and hematuria.  Musculoskeletal: Positive for back pain. Negative for arthralgias.  Skin: Negative for color change and rash.       Recurring irritation from gyn surgery, skin tag  Neurological: Positive for headaches (posterior head - stress related). Negative for dizziness, light-headedness and numbness.  Psychiatric/Behavioral: Negative for dysphoric mood. The patient is nervous/anxious.        Objective:   Vitals:   09/14/19 0820  BP: 132/80  Pulse: 79  Resp: 16  Temp: 98.3 F (36.8 C)  SpO2: 97%   Filed Weights   09/14/19 0820  Weight: 210 lb (95.3 kg)   Body mass index is 34.95 kg/m.  BP Readings from Last 3 Encounters:  09/14/19 132/80  02/08/19 129/81  12/29/18 125/81    Wt Readings from Last 3 Encounters:  09/14/19 210 lb (95.3 kg)  02/08/19 207 lb 6.4 oz (94.1 kg)  12/29/18 201 lb 1 oz (91.2 kg)     Physical Exam Constitutional: She appears well-developed and well-nourished. No distress.  HENT:  Head: Normocephalic and atraumatic.  Right  Ear: External ear normal. Normal ear canal and TM Left Ear: External ear normal.  Normal ear canal and TM Mouth/Throat: Oropharynx is clear and moist.  Eyes: Conjunctivae and EOM are normal.  Neck: Neck supple. No tracheal deviation present. No thyromegaly present.  No carotid bruit  Cardiovascular: Normal rate, regular rhythm and normal heart sounds.   No murmur heard.  No edema. Pulmonary/Chest: Effort normal and breath sounds normal. No respiratory distress. She has no wheezes. She has no rales.  Breast: deferred   Abdominal: Soft. She exhibits no distension. There is no tenderness.  Lymphadenopathy:  She has no cervical adenopathy.  Skin: Skin is warm and dry. She is not diaphoretic.  Psychiatric: She has a normal mood and affect. Her behavior is normal.        Assessment & Plan:   Physical exam: Screening blood work    ordered Immunizations  Flu vaccine today, tdap up to date Mammogram  Up to date  Gyn  Up to date  Eye exams  Up to date  Exercise   Walking-stressed the importance of keeping this regular Weight  Advised weight loss.  Discussed continuing regular exercise and revising diet Substance abuse  none  See Problem List for Assessment and Plan of chronic medical problems.   FU in one year

## 2019-09-13 NOTE — Patient Instructions (Addendum)
Tests ordered today. Your results will be released to [facility] (or called to you) after review.  If any changes need to be made, you will be notified at that same time.  All other Health Maintenance issues reviewed.   All recommended immunizations and age-appropriate screenings are up-to-date or discussed.  Flu immunization administered today.    Medications reviewed and updated.  Changes include :  none     Follow up in one year for a physical.    Health Maintenance, Female Adopting a healthy lifestyle and getting preventive care are important in promoting health and wellness. Ask your health care provider about:  The right schedule for you to have regular tests and exams.  Things you can do on your own to prevent diseases and keep yourself healthy. What should I know about diet, weight, and exercise? Eat a healthy diet   Eat a diet that includes plenty of vegetables, fruits, low-fat dairy products, and lean protein.  Do not eat a lot of foods that are high in solid fats, added sugars, or sodium. Maintain a healthy weight Body mass index (BMI) is used to identify weight problems. It estimates body fat based on height and weight. Your health care provider can help determine your BMI and help you achieve or maintain a healthy weight. Get regular exercise Get regular exercise. This is one of the most important things you can do for your health. Most adults should:  Exercise for at least 150 minutes each week. The exercise should increase your heart rate and make you sweat (moderate-intensity exercise).  Do strengthening exercises at least twice a week. This is in addition to the moderate-intensity exercise.  Spend less time sitting. Even light physical activity can be beneficial. Watch cholesterol and blood lipids Have your blood tested for lipids and cholesterol at 43 years of age, then have this test every 5 years. Have your cholesterol levels checked more often if:  Your  lipid or cholesterol levels are high.  You are older than 43 years of age.  You are at high risk for heart disease. What should I know about cancer screening? Depending on your health history and family history, you may need to have cancer screening at various ages. This may include screening for:  Breast cancer.  Cervical cancer.  Colorectal cancer.  Skin cancer.  Lung cancer. What should I know about heart disease, diabetes, and high blood pressure? Blood pressure and heart disease  High blood pressure causes heart disease and increases the risk of stroke. This is more likely to develop in people who have high blood pressure readings, are of African descent, or are overweight.  Have your blood pressure checked: ? Every 3-5 years if you are 27-52 years of age. ? Every year if you are 61 years old or older. Diabetes Have regular diabetes screenings. This checks your fasting blood sugar level. Have the screening done:  Once every three years after age 55 if you are at a normal weight and have a low risk for diabetes.  More often and at a younger age if you are overweight or have a high risk for diabetes. What should I know about preventing infection? Hepatitis B If you have a higher risk for hepatitis B, you should be screened for this virus. Talk with your health care provider to find out if you are at risk for hepatitis B infection. Hepatitis C Testing is recommended for:  Everyone born from [birth year] through 1965.  Anyone with known risk  factors for hepatitis C. Sexually transmitted infections (STIs)  Get screened for STIs, including gonorrhea and chlamydia, if: ? You are sexually active and are younger than 43 years of age. ? You are older than 43 years of age and your health care provider tells you that you are at risk for this type of infection. ? Your sexual activity has changed since you were last screened, and you are at increased risk for chlamydia or gonorrhea. Ask  your health care provider if you are at risk.  Ask your health care provider about whether you are at high risk for HIV. Your health care provider may recommend a prescription medicine to help prevent HIV infection. If you choose to take medicine to prevent HIV, you should first get tested for HIV. You should then be tested every 3 months for as long as you are taking the medicine. Pregnancy  If you are about to stop having your period (premenopausal) and you may become pregnant, seek counseling before you get pregnant.  Take 400 to 800 micrograms (mcg) of folic acid every day if you become pregnant.  Ask for birth control (contraception) if you want to prevent pregnancy. Osteoporosis and menopause Osteoporosis is a disease in which the bones lose minerals and strength with aging. This can result in bone fractures. If you are 78 years old or older, or if you are at risk for osteoporosis and fractures, ask your health care provider if you should:  Be screened for bone loss.  Take a calcium or vitamin D supplement to lower your risk of fractures.  Be given hormone replacement therapy (HRT) to treat symptoms of menopause. Follow these instructions at home: Lifestyle  Do not use any products that contain nicotine or tobacco, such as cigarettes, e-cigarettes, and chewing tobacco. If you need help quitting, ask your health care provider.  Do not use street drugs.  Do not share needles.  Ask your health care provider for help if you need support or information about quitting drugs. Alcohol use  Do not drink alcohol if: ? Your health care provider tells you not to drink. ? You are pregnant, may be pregnant, or are planning to become pregnant.  If you drink alcohol: ? Limit how much you use to 0-1 drink a day. ? Limit intake if you are breastfeeding.  Be aware of how much alcohol is in your drink. In the [place] one drink equals one 12 oz bottle of beer (355 mL), one 5 oz glass of wine  (148 mL), or one 1 oz glass of hard liquor (44 mL). General instructions  Schedule regular health, dental, and eye exams.  Stay current with your vaccines.  Tell your health care provider if: ? You often feel depressed. ? You have ever been abused or do not feel safe at home. Summary  Adopting a healthy lifestyle and getting preventive care are important in promoting health and wellness.  Follow your health care provider's instructions about healthy diet, exercising, and getting tested or screened for diseases.  Follow your health care provider's instructions on monitoring your cholesterol and blood pressure. This information is not intended to replace advice given to you by your health care provider. Make sure you discuss any questions you have with your health care provider. Document Released: 06/16/2011 Document Revised: 11/24/2018 Document Reviewed: 11/24/2018 Elsevier Patient Education  2020 [employer].

## 2019-09-14 ENCOUNTER — Other Ambulatory Visit: Payer: Self-pay

## 2019-09-14 ENCOUNTER — Encounter: Payer: Self-pay | Admitting: Internal Medicine

## 2019-09-14 ENCOUNTER — Ambulatory Visit (INDEPENDENT_AMBULATORY_CARE_PROVIDER_SITE_OTHER): Payer: 59 | Admitting: Internal Medicine

## 2019-09-14 ENCOUNTER — Other Ambulatory Visit (INDEPENDENT_AMBULATORY_CARE_PROVIDER_SITE_OTHER): Payer: 59

## 2019-09-14 VITALS — BP 132/80 | HR 79 | Temp 98.3°F | Resp 16 | Ht 65.0 in | Wt 210.0 lb

## 2019-09-14 DIAGNOSIS — Z23 Encounter for immunization: Secondary | ICD-10-CM | POA: Diagnosis not present

## 2019-09-14 DIAGNOSIS — F419 Anxiety disorder, unspecified: Secondary | ICD-10-CM | POA: Insufficient documentation

## 2019-09-14 DIAGNOSIS — Z0000 Encounter for general adult medical examination without abnormal findings: Secondary | ICD-10-CM

## 2019-09-14 DIAGNOSIS — E782 Mixed hyperlipidemia: Secondary | ICD-10-CM

## 2019-09-14 DIAGNOSIS — E785 Hyperlipidemia, unspecified: Secondary | ICD-10-CM | POA: Insufficient documentation

## 2019-09-14 LAB — CBC WITH DIFFERENTIAL/PLATELET
Basophils Absolute: 0 10*3/uL (ref 0.0–0.1)
Basophils Relative: 0.7 % (ref 0.0–3.0)
Eosinophils Absolute: 0.1 10*3/uL (ref 0.0–0.7)
Eosinophils Relative: 1.3 % (ref 0.0–5.0)
HCT: 41.5 % (ref 36.0–46.0)
Hemoglobin: 13.7 g/dL (ref 12.0–15.0)
Lymphocytes Relative: 22.7 % (ref 12.0–46.0)
Lymphs Abs: 1.4 10*3/uL (ref 0.7–4.0)
MCHC: 33 g/dL (ref 30.0–36.0)
MCV: 86.5 fl (ref 78.0–100.0)
Monocytes Absolute: 0.5 10*3/uL (ref 0.1–1.0)
Monocytes Relative: 7.6 % (ref 3.0–12.0)
Neutro Abs: 4.3 10*3/uL (ref 1.4–7.7)
Neutrophils Relative %: 67.7 % (ref 43.0–77.0)
Platelets: 245 10*3/uL (ref 150.0–400.0)
RBC: 4.8 Mil/uL (ref 3.87–5.11)
RDW: 13.8 % (ref 11.5–15.5)
WBC: 6.3 10*3/uL (ref 4.0–10.5)

## 2019-09-14 LAB — LIPID PANEL
Cholesterol: 222 mg/dL — ABNORMAL HIGH (ref 0–200)
HDL: 43.6 mg/dL (ref >39.00)
LDL Cholesterol: 152 mg/dL — ABNORMAL HIGH (ref 0–99)
NonHDL: 178
Total CHOL/HDL Ratio: 5
Triglycerides: 131 mg/dL (ref 0.0–149.0)
VLDL: 26.2 mg/dL (ref 0.0–40.0)

## 2019-09-14 LAB — COMPREHENSIVE METABOLIC PANEL
ALT: 16 U/L (ref 0–35)
AST: 12 U/L (ref 0–37)
Albumin: 4.1 g/dL (ref 3.5–5.2)
Alkaline Phosphatase: 65 U/L (ref 39–117)
BUN: 14 mg/dL (ref 6–23)
CO2: 28 mEq/L (ref 19–32)
Calcium: 9.2 mg/dL (ref 8.4–10.5)
Chloride: 104 mEq/L (ref 96–112)
Creatinine, Ser: 0.88 mg/dL (ref 0.40–1.20)
GFR: 70.19 mL/min (ref >60.00)
Glucose, Bld: 95 mg/dL (ref 70–99)
Potassium: 4.2 mEq/L (ref 3.5–5.1)
Sodium: 140 mEq/L (ref 135–145)
Total Bilirubin: 0.4 mg/dL (ref 0.2–1.2)
Total Protein: 7.4 g/dL (ref 6.0–8.3)

## 2019-09-14 LAB — TSH: TSH: 0.89 u[IU]/mL (ref 0.35–4.50)

## 2019-09-14 NOTE — Assessment & Plan Note (Signed)
She is experiencing increased anxiety and stress related to her kids She is walking regularly and I stressed the importance of continuing to do that Discussed that we could start a low dose daily antianxiety medication or she could speak with a therapist.  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

## 2019-09-14 NOTE — Assessment & Plan Note (Signed)
Check lipid panel, CMP, TSH Not on medication Encouraged her to continue regular exercise, work on weight loss Low fat/cholesterol diet

## 2019-09-17 ENCOUNTER — Encounter: Payer: Self-pay | Admitting: Internal Medicine

## 2021-01-11 ENCOUNTER — Other Ambulatory Visit: Payer: 59

## 2021-01-11 DIAGNOSIS — Z20822 Contact with and (suspected) exposure to covid-19: Secondary | ICD-10-CM

## 2021-01-12 LAB — SARS-COV-2, NAA 2 DAY TAT

## 2021-01-12 LAB — NOVEL CORONAVIRUS, NAA: SARS-CoV-2, NAA: NOT DETECTED

## 2021-08-27 ENCOUNTER — Other Ambulatory Visit: Payer: Self-pay | Admitting: Obstetrics & Gynecology

## 2021-08-27 DIAGNOSIS — Z1231 Encounter for screening mammogram for malignant neoplasm of breast: Secondary | ICD-10-CM

## 2021-09-09 ENCOUNTER — Encounter: Payer: Self-pay | Admitting: Internal Medicine

## 2021-09-09 ENCOUNTER — Other Ambulatory Visit: Payer: Self-pay

## 2021-09-09 DIAGNOSIS — Z833 Family history of diabetes mellitus: Secondary | ICD-10-CM | POA: Insufficient documentation

## 2021-09-09 NOTE — Progress Notes (Signed)
Subjective:    Patient ID: Rhonda Adams, female    DOB: 1976/03/31, 45 y.o.   MRN: [id number]   This visit occurred during the SARS-CoV-2 public health emergency.  Safety protocols were in place, including screening questions prior to the visit, additional usage of staff PPE, and extensive cleaning of exam room while observing appropriate contact time as indicated for disinfecting solutions.    HPI She is here for a physical exam.    She has had a stressful summer - her dad has had many medical problems and lives in [place] - he will be moving down here soon.  Kids are good - 2nd and 5th grade.    Medications and allergies reviewed with patient and updated if appropriate.  Patient Active Problem List   Diagnosis Date Noted   Family history of diabetes mellitus in grandfather 09/09/2021   Anxiety 09/14/2019   Hyperlipidemia 09/14/2019   Deviated septum 12/30/2016   MTHFR mutation 03/25/2016   History of DIC syndrome 09/07/2013   Migraine     Current Outpatient Medications on File Prior to Visit  Medication Sig Dispense Refill   Ascorbic Acid (VITAMIN C) 100 MG tablet Take 100 mg by mouth daily.     aspirin EC 81 MG tablet Take 81 mg by mouth daily.     cholecalciferol (VITAMIN D3) 25 MCG (1000 UT) tablet Take 1,000 Units by mouth daily.     ibuprofen (ADVIL,MOTRIN) 200 MG tablet Take 400 mg by mouth every 6 (six) hours as needed for headache or moderate pain.     Multiple Vitamins-Minerals (MULTIVITAMIN WITH MINERALS) tablet Take 1 tablet by mouth daily.     No current facility-administered medications on file prior to visit.    Past Medical History:  Diagnosis Date   Amniotic fluid embolism during pregnancy    Broken ankle    DIC (disseminated intravascular coagulation) ([facility]) 2012   Migraine    Ovarian cyst    Preterm labor 2012    Past Surgical History:  Procedure Laterality Date   ARTHROSCOPIC REPAIR ACL     left   DILATION AND CURETTAGE OF UTERUS     DILATION  AND CURETTAGE OF UTERUS N/A 12/29/2018   Procedure: DILATATION AND CURETTAGE WITH ATTEMPTED ENDOMETRIAL MINERVA ABLATION;  Surgeon: Emily Filbert, MD;  Location: [facility];  Service: Gynecology;  Laterality: N/A;   DILATION AND EVACUATION N/A 09/04/2013   Procedure: exam under anesthesia placement Bakri balloon;  Surgeon: Woodroe Mode, MD;  Location: [facility];  Service: Gynecology;  Laterality: N/A;   LAPAROSCOPIC BILATERAL SALPINGECTOMY Bilateral 04/22/2016   Procedure: LAPAROSCOPIC BILATERAL SALPINGECTOMY;  Surgeon: Emily Filbert, MD;  Location: [facility];  Service: Gynecology;  Laterality: Bilateral;   MISCARRIAGE     ORIF ANKLE FRACTURE Right 10/07/2014   Procedure: OPEN REDUCTION INTERNAL FIXATION (ORIF) ANKLE FRACTURE;  Surgeon: Meredith Pel, MD;  Location: [facility];  Service: Orthopedics;  Laterality: Right;    Social History   Socioeconomic History   Marital status: Married    Spouse name: Not on file   Number of children: 2   Years of education: Not on file   Highest education level: Not on file  Occupational History   Occupation: librarian  Tobacco Use   Smoking status: Never   Smokeless tobacco: Never  Substance and Sexual Activity   Alcohol use: Yes    Comment: rare   Drug use: No   Sexual activity: Yes  Partners: Male    Birth control/protection: Pill, Surgical  Other Topics Concern   Not on file  Social History Narrative   No regular exercise   Social Determinants of Health   Financial Resource Strain: Not on file  Food Insecurity: Not on file  Transportation Needs: Not on file  Physical Activity: Not on file  Stress: Not on file  Social Connections: Not on file    Family History  Problem Relation Age of Onset   Cancer Mother 96       BREAST   Breast cancer Mother    Hypertension Father    Heart disease Maternal Grandmother    Diabetes Maternal Grandfather    Stroke Paternal Grandfather    Hearing loss Neg Hx     Review of Systems   Constitutional:  Positive for fatigue. Negative for chills and fever.  Eyes:  Negative for visual disturbance.  Respiratory:  Negative for cough, shortness of breath and wheezing.   Cardiovascular:  Positive for palpitations (with moderate exertion). Negative for chest pain and leg swelling.  Gastrointestinal:  Negative for abdominal pain, blood in stool, constipation, diarrhea and nausea.       Rare gerd  Genitourinary:  Negative for dysuria.  Musculoskeletal:  Positive for arthralgias (both knee, r ankle), back pain (mild, occ) and neck pain (muscle tension).  Skin:  Negative for color change and rash.  Neurological:  Positive for headaches (stress, weather, sinus). Negative for light-headedness.  Psychiatric/Behavioral:  Negative for dysphoric mood. The patient is not nervous/anxious.       Objective:   Vitals:   09/10/21 0814  BP: 124/80  Pulse: 96  Temp: 98.4 F (36.9 C)  SpO2: 99%   Filed Weights   09/10/21 0814  Weight: 212 lb (96.2 kg)   Body mass index is 35.28 kg/m.  BP Readings from Last 3 Encounters:  09/10/21 124/80  09/14/19 132/80  02/08/19 129/81    Wt Readings from Last 3 Encounters:  09/10/21 212 lb (96.2 kg)  09/14/19 210 lb (95.3 kg)  02/08/19 207 lb 6.4 oz (94.1 kg)     Physical Exam Constitutional: She appears well-developed and well-nourished. No distress.  HENT:  Head: Normocephalic and atraumatic.  Right Ear: External ear normal. Normal ear canal and TM Left Ear: External ear normal.  Normal ear canal and TM Mouth/Throat: Oropharynx is clear and moist.  Eyes: Conjunctivae and EOM are normal.  Neck: Neck supple. No tracheal deviation present. No thyromegaly present.  No carotid bruit  Cardiovascular: Normal rate, regular rhythm and normal heart sounds.   No murmur heard.  No edema. Pulmonary/Chest: Effort normal and breath sounds normal. No respiratory distress. She has no wheezes. She has no rales.  Breast: deferred   Abdominal: Soft.  She exhibits no distension. There is no tenderness.  Lymphadenopathy: She has no cervical adenopathy.  Skin: Skin is warm and dry. She is not diaphoretic.  Psychiatric: She has a normal mood and affect. Her behavior is normal.     Lab Results  Component Value Date   WBC 6.3 09/14/2019   HGB 13.7 09/14/2019   HCT 41.5 09/14/2019   PLT 245.0 09/14/2019   GLUCOSE 95 09/14/2019   CHOL 222 (H) 09/14/2019   TRIG 131.0 09/14/2019   HDL 43.60 09/14/2019   LDLCALC 152 (H) 09/14/2019   ALT 16 09/14/2019   AST 12 09/14/2019   NA 140 09/14/2019   K 4.2 09/14/2019   CL 104 09/14/2019   CREATININE 0.88 09/14/2019  BUN 14 09/14/2019   CO2 28 09/14/2019   TSH 0.89 09/14/2019   INR 1.13 09/04/2013         Assessment & Plan:   Physical exam: Screening blood work  ordered Exercise  walking irregularly Weight  encouraged weight loss - she is working on it now Substance abuse  none   Reviewed recommended immunizations.   Health Maintenance  Topic Date Due   COVID-19 Vaccine (1) Never done   Hepatitis C Screening  Never done   MAMMOGRAM  05/22/2021   INFLUENZA VACCINE  07/15/2021   PAP SMEAR-Modifier  02/20/2023   TETANUS/TDAP  09/06/2023   HIV Screening  Completed   HPV VACCINES  Aged Out          See Problem List for Assessment and Plan of chronic medical problems.

## 2021-09-09 NOTE — Patient Instructions (Addendum)
Blood work was ordered.      Medications changes include :   none    Please followup in 1 year    Health Maintenance, Female Adopting a healthy lifestyle and getting preventive care are important in promoting health and wellness. Ask your health care provider about: The right schedule for you to have regular tests and exams. Things you can do on your own to prevent diseases and keep yourself healthy. What should I know about diet, weight, and exercise? Eat a healthy diet  Eat a diet that includes plenty of vegetables, fruits, low-fat dairy products, and lean protein. Do not eat a lot of foods that are high in solid fats, added sugars, or sodium. Maintain a healthy weight Body mass index (BMI) is used to identify weight problems. It estimates body fat based on height and weight. Your health care provider can help determine your BMI and help you achieve or maintain a healthy weight. Get regular exercise Get regular exercise. This is one of the most important things you can do for your health. Most adults should: Exercise for at least 150 minutes each week. The exercise should increase your heart rate and make you sweat (moderate-intensity exercise). Do strengthening exercises at least twice a week. This is in addition to the moderate-intensity exercise. Spend less time sitting. Even light physical activity can be beneficial. Watch cholesterol and blood lipids Have your blood tested for lipids and cholesterol at 45 years of age, then have this test every 5 years. Have your cholesterol levels checked more often if: Your lipid or cholesterol levels are high. You are older than 45 years of age. You are at high risk for heart disease. What should I know about cancer screening? Depending on your health history and family history, you may need to have cancer screening at various ages. This may include screening for: Breast cancer. Cervical cancer. Colorectal cancer. Skin cancer. Lung  cancer. What should I know about heart disease, diabetes, and high blood pressure? Blood pressure and heart disease High blood pressure causes heart disease and increases the risk of stroke. This is more likely to develop in people who have high blood pressure readings, are of African descent, or are overweight. Have your blood pressure checked: Every 3-5 years if you are 45-65 years of age. Every year if you are 45 years old or older. Diabetes Have regular diabetes screenings. This checks your fasting blood sugar level. Have the screening done: Once every three years after age 45 if you are at a normal weight and have a low risk for diabetes. More often and at a younger age if you are overweight or have a high risk for diabetes. What should I know about preventing infection? Hepatitis B If you have a higher risk for hepatitis B, you should be screened for this virus. Talk with your health care provider to find out if you are at risk for hepatitis B infection. Hepatitis C Testing is recommended for: Everyone born from [birth year] through 1965. Anyone with known risk factors for hepatitis C. Sexually transmitted infections (STIs) Get screened for STIs, including gonorrhea and chlamydia, if: You are sexually active and are younger than 45 years of age. You are older than 45 years of age and your health care provider tells you that you are at risk for this type of infection. Your sexual activity has changed since you were last screened, and you are at increased risk for chlamydia or gonorrhea. Ask your health care provider if  you are at risk. Ask your health care provider about whether you are at high risk for HIV. Your health care provider may recommend a prescription medicine to help prevent HIV infection. If you choose to take medicine to prevent HIV, you should first get tested for HIV. You should then be tested every 3 months for as long as you are taking the medicine. Pregnancy If you are about  to stop having your period (premenopausal) and you may become pregnant, seek counseling before you get pregnant. Take 400 to 800 micrograms (mcg) of folic acid every day if you become pregnant. Ask for birth control (contraception) if you want to prevent pregnancy. Osteoporosis and menopause Osteoporosis is a disease in which the bones lose minerals and strength with aging. This can result in bone fractures. If you are 45 years old or older, or if you are at risk for osteoporosis and fractures, ask your health care provider if you should: Be screened for bone loss. Take a calcium or vitamin D supplement to lower your risk of fractures. Be given hormone replacement therapy (HRT) to treat symptoms of menopause. Follow these instructions at home: Lifestyle Do not use any products that contain nicotine or tobacco, such as cigarettes, e-cigarettes, and chewing tobacco. If you need help quitting, ask your health care provider. Do not use street drugs. Do not share needles. Ask your health care provider for help if you need support or information about quitting drugs. Alcohol use Do not drink alcohol if: Your health care provider tells you not to drink. You are pregnant, may be pregnant, or are planning to become pregnant. If you drink alcohol: Limit how much you use to 0-1 drink a day. Limit intake if you are breastfeeding. Be aware of how much alcohol is in your drink. In the [place] one drink equals one 12 oz bottle of beer (355 mL), one 5 oz glass of wine (148 mL), or one 1 oz glass of hard liquor (44 mL). General instructions Schedule regular health, dental, and eye exams. Stay current with your vaccines. Tell your health care provider if: You often feel depressed. You have ever been abused or do not feel safe at home. Summary Adopting a healthy lifestyle and getting preventive care are important in promoting health and wellness. Follow your health care provider's instructions about  healthy diet, exercising, and getting tested or screened for diseases. Follow your health care provider's instructions on monitoring your cholesterol and blood pressure. This information is not intended to replace advice given to you by your health care provider. Make sure you discuss any questions you have with your health care provider. Document Revised: 02/08/2021 Document Reviewed: 11/24/2018 Elsevier Patient Education  2022 [employer].

## 2021-09-10 ENCOUNTER — Ambulatory Visit (INDEPENDENT_AMBULATORY_CARE_PROVIDER_SITE_OTHER): Payer: 59 | Admitting: Internal Medicine

## 2021-09-10 VITALS — BP 124/80 | HR 96 | Temp 98.4°F | Ht 65.0 in | Wt 212.0 lb

## 2021-09-10 DIAGNOSIS — E782 Mixed hyperlipidemia: Secondary | ICD-10-CM

## 2021-09-10 DIAGNOSIS — Z1211 Encounter for screening for malignant neoplasm of colon: Secondary | ICD-10-CM | POA: Diagnosis not present

## 2021-09-10 DIAGNOSIS — Z0000 Encounter for general adult medical examination without abnormal findings: Secondary | ICD-10-CM | POA: Diagnosis not present

## 2021-09-10 DIAGNOSIS — Z833 Family history of diabetes mellitus: Secondary | ICD-10-CM

## 2021-09-10 DIAGNOSIS — Z1159 Encounter for screening for other viral diseases: Secondary | ICD-10-CM

## 2021-09-10 LAB — COMPREHENSIVE METABOLIC PANEL
ALT: 16 U/L (ref 0–35)
AST: 14 U/L (ref 0–37)
Albumin: 4 g/dL (ref 3.5–5.2)
Alkaline Phosphatase: 70 U/L (ref 39–117)
BUN: 15 mg/dL (ref 6–23)
CO2: 29 mEq/L (ref 19–32)
Calcium: 9.2 mg/dL (ref 8.4–10.5)
Chloride: 104 mEq/L (ref 96–112)
Creatinine, Ser: 0.85 mg/dL (ref 0.40–1.20)
GFR: 83.09 mL/min (ref >60.00)
Glucose, Bld: 86 mg/dL (ref 70–99)
Potassium: 4 mEq/L (ref 3.5–5.1)
Sodium: 140 mEq/L (ref 135–145)
Total Bilirubin: 0.4 mg/dL (ref 0.2–1.2)
Total Protein: 7 g/dL (ref 6.0–8.3)

## 2021-09-10 LAB — HEMOGLOBIN A1C: Hgb A1c MFr Bld: 5.6 % (ref 4.6–6.5)

## 2021-09-10 LAB — CBC WITH DIFFERENTIAL/PLATELET
Basophils Absolute: 0 10*3/uL (ref 0.0–0.1)
Basophils Relative: 0.7 % (ref 0.0–3.0)
Eosinophils Absolute: 0.1 10*3/uL (ref 0.0–0.7)
Eosinophils Relative: 2.3 % (ref 0.0–5.0)
HCT: 39.7 % (ref 36.0–46.0)
Hemoglobin: 13.1 g/dL (ref 12.0–15.0)
Lymphocytes Relative: 27.7 % (ref 12.0–46.0)
Lymphs Abs: 1.3 10*3/uL (ref 0.7–4.0)
MCHC: 32.9 g/dL (ref 30.0–36.0)
MCV: 85.5 fl (ref 78.0–100.0)
Monocytes Absolute: 0.3 10*3/uL (ref 0.1–1.0)
Monocytes Relative: 6.1 % (ref 3.0–12.0)
Neutro Abs: 3 10*3/uL (ref 1.4–7.7)
Neutrophils Relative %: 63.2 % (ref 43.0–77.0)
Platelets: 233 10*3/uL (ref 150.0–400.0)
RBC: 4.64 Mil/uL (ref 3.87–5.11)
RDW: 13.4 % (ref 11.5–15.5)
WBC: 4.7 10*3/uL (ref 4.0–10.5)

## 2021-09-10 LAB — LIPID PANEL
Cholesterol: 228 mg/dL — ABNORMAL HIGH (ref 0–200)
HDL: 41.3 mg/dL (ref >39.00)
LDL Cholesterol: 159 mg/dL — ABNORMAL HIGH (ref 0–99)
NonHDL: 186.2
Total CHOL/HDL Ratio: 6
Triglycerides: 138 mg/dL (ref 0.0–149.0)
VLDL: 27.6 mg/dL (ref 0.0–40.0)

## 2021-09-10 LAB — TSH: TSH: 0.97 u[IU]/mL (ref 0.35–5.50)

## 2021-09-10 NOTE — Assessment & Plan Note (Signed)
Family history of DM Will check a1c Encouraged regular exercise, low sugar/carb diet

## 2021-09-10 NOTE — Assessment & Plan Note (Signed)
Chronic Check lipid panel  lifestyle controlled Regular exercise and healthy diet encouraged

## 2021-09-10 NOTE — Addendum Note (Signed)
Addended by: Jacobo Forest on: 09/10/2021 08:49 AM   Modules accepted: Orders

## 2021-09-11 LAB — HEPATITIS C ANTIBODY
Hepatitis C Ab: NONREACTIVE
SIGNAL TO CUT-OFF: 0.01 (ref <1.00)

## 2021-09-30 ENCOUNTER — Ambulatory Visit
Admission: RE | Admit: 2021-09-30 | Discharge: 2021-09-30 | Disposition: A | Discharge status: Home / Self Care | Payer: 59 | Source: Ambulatory Visit | Attending: Obstetrics & Gynecology | Admitting: Obstetrics & Gynecology

## 2021-09-30 ENCOUNTER — Other Ambulatory Visit: Payer: Self-pay

## 2021-09-30 DIAGNOSIS — Z1231 Encounter for screening mammogram for malignant neoplasm of breast: Secondary | ICD-10-CM

## 2021-10-01 ENCOUNTER — Encounter: Payer: Self-pay | Admitting: Obstetrics & Gynecology

## 2021-10-01 ENCOUNTER — Ambulatory Visit (INDEPENDENT_AMBULATORY_CARE_PROVIDER_SITE_OTHER): Payer: 59 | Admitting: Obstetrics & Gynecology

## 2021-10-01 ENCOUNTER — Other Ambulatory Visit (HOSPITAL_COMMUNITY)
Admission: RE | Admit: 2021-10-01 | Discharge: 2021-10-01 | Disposition: A | Discharge status: Home / Self Care | Payer: 59 | Source: Ambulatory Visit | Attending: Obstetrics & Gynecology | Admitting: Obstetrics & Gynecology

## 2021-10-01 VITALS — BP 128/82 | HR 91 | Ht 64.0 in | Wt 210.0 lb

## 2021-10-01 DIAGNOSIS — Z01419 Encounter for gynecological examination (general) (routine) without abnormal findings: Secondary | ICD-10-CM | POA: Insufficient documentation

## 2021-10-01 DIAGNOSIS — N920 Excessive and frequent menstruation with regular cycle: Secondary | ICD-10-CM

## 2021-10-01 NOTE — Progress Notes (Signed)
Patient ID: Rhonda Adams, female   DOB: [birth year], 45 y.o.   MRN: [id number]  Chief Complaint  Patient presents with   Gynecologic Exam    HPI Rhonda Adams is a 45 y.o. female.  [id number] Patient's last menstrual period was 09/27/2021 (exact date). Patient comes for annual exam, pap done 2019 normal. Menses last for 5 days and are regular with some heavy flow and clots HPI  Past Medical History:  Diagnosis Date   Amniotic fluid embolism during pregnancy    Broken ankle    DIC (disseminated intravascular coagulation) ([place]) 2012   Migraine    Ovarian cyst    Preterm labor 2012    Past Surgical History:  Procedure Laterality Date   ARTHROSCOPIC REPAIR ACL     left   DILATION AND CURETTAGE OF UTERUS     DILATION AND CURETTAGE OF UTERUS N/A 12/29/2018   Procedure: DILATATION AND CURETTAGE WITH ATTEMPTED ENDOMETRIAL MINERVA ABLATION;  Surgeon: Emily Filbert, MD;  Location: [facility];  Service: Gynecology;  Laterality: N/A;   DILATION AND EVACUATION N/A 09/04/2013   Procedure: exam under anesthesia placement Bakri balloon;  Surgeon: Woodroe Mode, MD;  Location: [facility];  Service: Gynecology;  Laterality: N/A;   LAPAROSCOPIC BILATERAL SALPINGECTOMY Bilateral 04/22/2016   Procedure: LAPAROSCOPIC BILATERAL SALPINGECTOMY;  Surgeon: Emily Filbert, MD;  Location: [facility];  Service: Gynecology;  Laterality: Bilateral;   MISCARRIAGE     ORIF ANKLE FRACTURE Right 10/07/2014   Procedure: OPEN REDUCTION INTERNAL FIXATION (ORIF) ANKLE FRACTURE;  Surgeon: Meredith Pel, MD;  Location: [facility];  Service: Orthopedics;  Laterality: Right;    Family History  Problem Relation Age of Onset   Cancer Mother 41       BREAST   Breast cancer Mother    Hypertension Father    Heart disease Maternal Grandmother    Diabetes Maternal Grandfather    Stroke Paternal Grandfather    Hearing loss Neg Hx     Social History Social History   Tobacco Use   Smoking status: Never   Smokeless  tobacco: Never  Substance Use Topics   Alcohol use: Yes    Comment: rare   Drug use: No    Allergies  Allergen Reactions   Bacon Flavor Nausea And Vomiting    Pt states she is allergic to bacon; not bacon flavor.    Current Outpatient Medications  Medication Sig Dispense Refill   Ascorbic Acid (VITAMIN C) 100 MG tablet Take 100 mg by mouth daily.     aspirin EC 81 MG tablet Take 81 mg by mouth daily.     cholecalciferol (VITAMIN D3) 25 MCG (1000 UT) tablet Take 1,000 Units by mouth daily.     ibuprofen (ADVIL,MOTRIN) 200 MG tablet Take 400 mg by mouth every 6 (six) hours as needed for headache or moderate pain.     Multiple Vitamins-Minerals (MULTIVITAMIN WITH MINERALS) tablet Take 1 tablet by mouth daily.     No current facility-administered medications for this visit.    Review of Systems Review of Systems  Constitutional: Negative.   Respiratory: Negative.    Cardiovascular: Negative.   Gastrointestinal: Negative.   Genitourinary:  Positive for menstrual problem (heavy).   Blood pressure 128/82, pulse 91, height 5\' 4"  (1.626 m), weight 210 lb (95.3 kg), last menstrual period 09/27/2021.  Physical Exam Physical Exam Vitals and nursing note reviewed. Exam conducted with a chaperone present.  Constitutional:      Appearance: She  is obese.  Cardiovascular:     Rate and Rhythm: Normal rate and regular rhythm.     Heart sounds: Normal heart sounds.  Pulmonary:     Effort: Pulmonary effort is normal.     Breath sounds: Normal breath sounds.  Chest:  Breasts:    Right: Normal.     Left: Normal.  Genitourinary:    General: Normal vulva.     Exam position: Lithotomy position.     Vagina: Normal.     Cervix: Normal.     Uterus: Normal.      Adnexa: Right adnexa normal and left adnexa normal.  Skin:    General: Skin is warm and dry.  Neurological:     General: No focal deficit present.     Mental Status: She is alert and oriented to person, place, and time.     Data Reviewed Pap 2019 Mammogram pending report 09/30/21  Assessment Well woman exam with routine gynecological exam  Menorrhagia with regular cycle S/p sterilization  Plan Yearly mammogram Pap in 3-5 years if normal No orders of the defined types were placed in this encounter.    Emeterio Reeve 10/01/2021, 8:50 AM

## 2021-10-01 NOTE — Progress Notes (Signed)
GYN presents for AEX/PAP.  C/o vaginal dryness. Last Mammogram 09/30/2021

## 2021-10-04 LAB — CYTOLOGY - PAP: Adequacy: ABNORMAL

## 2021-10-07 ENCOUNTER — Other Ambulatory Visit: Payer: Self-pay | Admitting: Obstetrics & Gynecology

## 2021-10-07 DIAGNOSIS — R928 Other abnormal and inconclusive findings on diagnostic imaging of breast: Secondary | ICD-10-CM

## 2021-10-23 ENCOUNTER — Ambulatory Visit
Admission: RE | Admit: 2021-10-23 | Discharge: 2021-10-23 | Disposition: A | Discharge status: Home / Self Care | Payer: 59 | Source: Ambulatory Visit | Attending: Obstetrics & Gynecology | Admitting: Obstetrics & Gynecology

## 2021-10-23 ENCOUNTER — Other Ambulatory Visit: Payer: Self-pay

## 2021-10-23 DIAGNOSIS — R928 Other abnormal and inconclusive findings on diagnostic imaging of breast: Secondary | ICD-10-CM

## 2021-11-05 ENCOUNTER — Other Ambulatory Visit: Payer: Self-pay

## 2021-11-05 ENCOUNTER — Other Ambulatory Visit (HOSPITAL_COMMUNITY)
Admission: RE | Admit: 2021-11-05 | Discharge: 2021-11-05 | Disposition: A | Discharge status: Home / Self Care | Payer: 59 | Source: Ambulatory Visit | Attending: Obstetrics & Gynecology | Admitting: Obstetrics & Gynecology

## 2021-11-05 ENCOUNTER — Ambulatory Visit (INDEPENDENT_AMBULATORY_CARE_PROVIDER_SITE_OTHER): Payer: 59 | Admitting: Obstetrics & Gynecology

## 2021-11-05 ENCOUNTER — Encounter: Payer: Self-pay | Admitting: Obstetrics & Gynecology

## 2021-11-05 VITALS — BP 126/85 | HR 78 | Ht 65.0 in | Wt 210.0 lb

## 2021-11-05 DIAGNOSIS — R87615 Unsatisfactory cytologic smear of cervix: Secondary | ICD-10-CM | POA: Diagnosis not present

## 2021-11-05 NOTE — Progress Notes (Signed)
Cc: repeat pap for insufficient cells Subjective:     Patient ID: Rhonda Adams, female   DOB: Dec 15, 1976, 45 y.o.   MRN: [id number]  HPI [id number] Patient's last menstrual period was 10/21/2021 (exact date). Pap last month could not be processed  Review of Systems  All other systems reviewed and are negative.     Objective:   Physical Exam Vitals and nursing note reviewed. Exam conducted with a chaperone present.  Constitutional:      Appearance: Normal appearance.  Pulmonary:     Effort: Pulmonary effort is normal.  Genitourinary:    General: Normal vulva.     Exam position: Lithotomy position.     Vagina: Normal.     Cervix: Normal.  Neurological:     Mental Status: She is alert.       Assessment:     Pap repeated   Encounter for repeat Pap smear due to previous insuff cervical cells - Plan: Cytology - PAP  Plan:     F/u pap result   Woodroe Mode, MD 11/05/2021

## 2021-11-05 NOTE — Progress Notes (Signed)
Pt is here for repeat pap due to insufficient cells. LMP: 10/21/21

## 2021-11-12 LAB — CYTOLOGY - PAP
Comment: NEGATIVE
Diagnosis: NEGATIVE
Diagnosis: REACTIVE
High risk HPV: NEGATIVE

## 2022-02-03 ENCOUNTER — Telehealth: Payer: Self-pay

## 2022-02-03 NOTE — Telephone Encounter (Signed)
Returned call and pt states that she has been having irregular cycles recently, she reports LMP 01/17/22 and has been bleeding light to heavy ever since. Pt acknowledges possible menopause, transferred to scheduler for appt.

## 2022-02-11 ENCOUNTER — Ambulatory Visit: Payer: 59 | Admitting: Obstetrics & Gynecology

## 2022-02-11 ENCOUNTER — Other Ambulatory Visit: Payer: Self-pay

## 2022-02-11 VITALS — BP 129/84 | HR 83 | Wt 213.0 lb

## 2022-02-11 DIAGNOSIS — N920 Excessive and frequent menstruation with regular cycle: Secondary | ICD-10-CM | POA: Diagnosis not present

## 2022-02-11 DIAGNOSIS — N924 Excessive bleeding in the premenopausal period: Secondary | ICD-10-CM | POA: Diagnosis not present

## 2022-02-11 MED ORDER — MEGESTROL ACETATE 40 MG PO TABS: 40.0000 mg | ORAL_TABLET | Freq: Two times a day (BID) | ORAL | 0 refills | Status: DC

## 2022-02-11 NOTE — Progress Notes (Signed)
Patient ID: Rhonda Adams, female   DOB: 30-Apr-1976, 46 y.o.   MRN: [id number]  Chief Complaint  Patient presents with   Gynecologic Exam   DUB HPI Rhonda Adams is a 46 y.o. female.  [id number] Patient's last menstrual period was 01/17/2022. She has had vaginal bleeding since the beginning of February which was heavy on occasion. H/O endometrial polyp and attempted Minerva ablation was reviewed. HSG done 2019 was negative. S/p BTS with salpingectomy HPI  Past Medical History:  Diagnosis Date   Amniotic fluid embolism during pregnancy    Broken ankle    DIC (disseminated intravascular coagulation) ([facility]) 2012   Migraine    Ovarian cyst    Preterm labor 2012    Past Surgical History:  Procedure Laterality Date   ARTHROSCOPIC REPAIR ACL     left   DILATION AND CURETTAGE OF UTERUS     DILATION AND CURETTAGE OF UTERUS N/A 12/29/2018   Procedure: DILATATION AND CURETTAGE WITH ATTEMPTED ENDOMETRIAL MINERVA ABLATION;  Surgeon: Emily Filbert, MD;  Location: [facility];  Service: Gynecology;  Laterality: N/A;   DILATION AND EVACUATION N/A 09/04/2013   Procedure: exam under anesthesia placement Bakri balloon;  Surgeon: Woodroe Mode, MD;  Location: [facility];  Service: Gynecology;  Laterality: N/A;   LAPAROSCOPIC BILATERAL SALPINGECTOMY Bilateral 04/22/2016   Procedure: LAPAROSCOPIC BILATERAL SALPINGECTOMY;  Surgeon: Emily Filbert, MD;  Location: [facility];  Service: Gynecology;  Laterality: Bilateral;   MISCARRIAGE     ORIF ANKLE FRACTURE Right 10/07/2014   Procedure: OPEN REDUCTION INTERNAL FIXATION (ORIF) ANKLE FRACTURE;  Surgeon: Meredith Pel, MD;  Location: [facility];  Service: Orthopedics;  Laterality: Right;    Family History  Problem Relation Age of Onset   Cancer Mother 39       BREAST   Breast cancer Mother    Hypertension Father    Heart disease Maternal Grandmother    Diabetes Maternal Grandfather    Stroke Paternal Grandfather    Hearing loss Neg Hx     Social  History Social History   Tobacco Use   Smoking status: Never   Smokeless tobacco: Never  Substance Use Topics   Alcohol use: Yes    Comment: rare   Drug use: No    Allergies  Allergen Reactions   Bacon Flavor Nausea And Vomiting    Pt states she is allergic to bacon; not bacon flavor.    Current Outpatient Medications  Medication Sig Dispense Refill   Ascorbic Acid (VITAMIN C) 100 MG tablet Take 100 mg by mouth daily.     aspirin EC 81 MG tablet Take 81 mg by mouth daily.     cholecalciferol (VITAMIN D3) 25 MCG (1000 UT) tablet Take 1,000 Units by mouth daily.     ibuprofen (ADVIL,MOTRIN) 200 MG tablet Take 400 mg by mouth every 6 (six) hours as needed for headache or moderate pain.     Multiple Vitamins-Minerals (MULTIVITAMIN WITH MINERALS) tablet Take 1 tablet by mouth daily.     No current facility-administered medications for this visit.    Review of Systems Review of Systems  Constitutional: Negative.   Respiratory: Negative.    Cardiovascular: Negative.   Gastrointestinal: Negative.   Genitourinary:  Positive for menstrual problem and vaginal bleeding. Negative for pelvic pain and vaginal discharge.   Blood pressure 129/84, pulse 83, weight 213 lb (96.6 kg), last menstrual period 01/17/2022.  Physical Exam Physical Exam Vitals and nursing note reviewed.  Constitutional:  Appearance: Normal appearance.  Pulmonary:     Effort: Pulmonary effort is normal.  Musculoskeletal:        General: Normal range of motion.  Neurological:     Mental Status: She is alert.  Psychiatric:        Mood and Affect: Mood normal.        Behavior: Behavior normal.    Data Reviewed eceived: 12/29/2018 Myra Dove DOB: Dec 29, 1975 Age: 32 Gender: F Reported: 12/30/2018 [address] Patient Ph: [phone] MRN #: [id number] [place], [place] [address] Visit #: [id number].[facility] Chart #: Phone:  Fax: CC: REPORT OF SURGICAL PATHOLOGY FINAL DIAGNOSIS Diagnosis Endometrium,  curettage - BENIGN ENDOMETRIAL POLYP. - NEGATIVE FOR HYPERPLASIA OR MALIGNANCY. Jaquita Folds MD Pathologist, Electronic Signature (Case signed 12/30/2018) Specimen Gross and Clinical Information Specimen(s) Obtained: Endometrium, curettage Narrative & Impression  CLINICAL DATA:  Menorrhagia with regular cycle. Possible endometrial polyp on prior ultrasound.   EXAM: [place] SONOHYSTEROGRAM   TECHNIQUE: Following cleansing of the cervix and vagina with Betadine, a hysterosalpingogram catheter was placed within the endocervical canal. Sonohysterogram was then performed with transvaginal sonography during infusion of sterile saline solution into the endometrial cavity.   COMPARISON:  03/04/2018   FINDINGS: No polyps, masses or focal areas of thickening are seen involving the endometrium. No submucosal fibroids or other abnormality of the endometrial cavity identified.   IMPRESSION: Negative.  No evidence of endometrial polyp or submucosal fibroid.     Electronically Signed   By: Earle Gell M.D.   On: 03/31/2018 12:23   Assessment Menorrhagia with regular cycle - Plan: US PELVIC COMPLETE WITH TRANSVAGINAL  Abnormal perimenopausal bleeding   Plan Repeat pelvic US Discussed possible ablation vs. LNGIUD Megace for 3 weeks RTC to discuss result and plan    Emeterio Reeve 02/11/2022, 10:21 AM

## 2022-02-11 NOTE — Progress Notes (Signed)
Pt states recent changes in cycles.  Pt is having episodes of no cycles and next will be worse.  Pt has been bleeding now since Feb 3.  Pt has had BTL in past, pt was failed on Ablation - could not get.

## 2022-02-19 ENCOUNTER — Ambulatory Visit: Payer: 59

## 2022-02-24 ENCOUNTER — Ambulatory Visit
Admission: RE | Admit: 2022-02-24 | Discharge: 2022-02-24 | Disposition: A | Discharge status: Home / Self Care | Payer: 59 | Source: Ambulatory Visit | Attending: Obstetrics & Gynecology | Admitting: Obstetrics & Gynecology

## 2022-02-24 ENCOUNTER — Other Ambulatory Visit: Payer: 59

## 2022-02-24 ENCOUNTER — Other Ambulatory Visit: Payer: Self-pay

## 2022-02-24 DIAGNOSIS — N920 Excessive and frequent menstruation with regular cycle: Secondary | ICD-10-CM | POA: Diagnosis present

## 2022-03-11 ENCOUNTER — Other Ambulatory Visit: Payer: Self-pay

## 2022-03-11 ENCOUNTER — Ambulatory Visit (INDEPENDENT_AMBULATORY_CARE_PROVIDER_SITE_OTHER): Payer: 59 | Admitting: Obstetrics & Gynecology

## 2022-03-11 VITALS — BP 126/85 | HR 80 | Wt 209.0 lb

## 2022-03-11 DIAGNOSIS — N924 Excessive bleeding in the premenopausal period: Secondary | ICD-10-CM | POA: Diagnosis not present

## 2022-03-11 DIAGNOSIS — N920 Excessive and frequent menstruation with regular cycle: Secondary | ICD-10-CM

## 2022-03-11 MED ORDER — MEGESTROL ACETATE 40 MG PO TABS: 40.0000 mg | ORAL_TABLET | Freq: Two times a day (BID) | ORAL | 0 refills | Status: DC

## 2022-03-11 NOTE — Progress Notes (Signed)
Patient ID: Rhonda Adams, female   DOB: 05-17-1976, 46 y.o.   MRN: [id number] ? ?

## 2022-03-11 NOTE — Progress Notes (Signed)
Patient ID: Rhonda Adams, female   DOB: Aug 15, 1976, 46 y.o.   MRN: [id number] ? ?Chief Complaint  ?Patient presents with  ? Follow-up  ?S/p pelvic US ? ?HPI ?Rhonda Adams is a 46 y.o. female.  [id number]  No LMP recorded. ?Patient had prolonged episode of DUB that stopped after taking Megace in February. She comes to discuss management after her [place]. No bleeding today ?HPI ? ?Past Medical History:  ?Diagnosis Date  ? Amniotic fluid embolism during pregnancy   ? Broken ankle   ? DIC (disseminated intravascular coagulation) ([facility]) 2012  ? Migraine   ? Ovarian cyst   ? Preterm labor 2012  ? ? ?Past Surgical History:  ?Procedure Laterality Date  ? ARTHROSCOPIC REPAIR ACL    ? left  ? DILATION AND CURETTAGE OF UTERUS    ? DILATION AND CURETTAGE OF UTERUS N/A 12/29/2018  ? Procedure: DILATATION AND CURETTAGE WITH ATTEMPTED ENDOMETRIAL MINERVA ABLATION;  Surgeon: Emily Filbert, MD;  Location: [facility];  Service: Gynecology;  Laterality: N/A;  ? DILATION AND EVACUATION N/A 09/04/2013  ? Procedure: exam under anesthesia placement Bakri balloon;  Surgeon: Woodroe Mode, MD;  Location: [facility];  Service: Gynecology;  Laterality: N/A;  ? LAPAROSCOPIC BILATERAL SALPINGECTOMY Bilateral 04/22/2016  ? Procedure: LAPAROSCOPIC BILATERAL SALPINGECTOMY;  Surgeon: Emily Filbert, MD;  Location: [facility];  Service: Gynecology;  Laterality: Bilateral;  ? MISCARRIAGE    ? ORIF ANKLE FRACTURE Right 10/07/2014  ? Procedure: OPEN REDUCTION INTERNAL FIXATION (ORIF) ANKLE FRACTURE;  Surgeon: Meredith Pel, MD;  Location: [facility];  Service: Orthopedics;  Laterality: Right;  ? ? ?Family History  ?Problem Relation Age of Onset  ? Cancer Mother 79  ?     BREAST  ? Breast cancer Mother   ? Hypertension Father   ? Heart disease Maternal Grandmother   ? Diabetes Maternal Grandfather   ? Stroke Paternal Grandfather   ? Hearing loss Neg Hx   ? ? ?Social History ?Social History  ? ?Tobacco Use  ? Smoking status: Never  ? Smokeless tobacco: Never   ?Substance Use Topics  ? Alcohol use: Yes  ?  Comment: rare  ? Drug use: No  ? ? ?Allergies  ?Allergen Reactions  ? Berniece Salines Flavor Nausea And Vomiting  ?  Pt states she is allergic to bacon; not bacon flavor.  ? ? ?Current Outpatient Medications  ?Medication Sig Dispense Refill  ? Ascorbic Acid (VITAMIN C) 100 MG tablet Take 100 mg by mouth daily.    ? aspirin EC 81 MG tablet Take 81 mg by mouth daily.    ? cholecalciferol (VITAMIN D3) 25 MCG (1000 UT) tablet Take 1,000 Units by mouth daily.    ? Multiple Vitamins-Minerals (MULTIVITAMIN WITH MINERALS) tablet Take 1 tablet by mouth daily.    ? ibuprofen (ADVIL,MOTRIN) 200 MG tablet Take 400 mg by mouth every 6 (six) hours as needed for headache or moderate pain.    ? megestrol (MEGACE) 40 MG tablet Take 1 tablet (40 mg total) by mouth 2 (two) times daily. 42 tablet 0  ? ?No current facility-administered medications for this visit.  ? ? ?Review of Systems ?Review of Systems  ?Respiratory: Negative.    ?Cardiovascular: Negative.   ?Gastrointestinal: Negative.   ?Genitourinary:  Positive for menstrual problem. Negative for pelvic pain and vaginal bleeding.  ? ?Blood pressure 126/85, pulse 80, weight 209 lb (94.8 kg). ? ?Physical Exam ?Physical Exam ?Vitals and nursing note reviewed.  ?Constitutional:   ?  Appearance: She is obese.  ?Cardiovascular:  ?   Rate and Rhythm: Normal rate.  ?Pulmonary:  ?   Effort: Pulmonary effort is normal.  ?Neurological:  ?   Mental Status: She is alert.  ?Psychiatric:     ?   Mood and Affect: Mood normal.     ?   Behavior: Behavior normal.  ? ? ?Data Reviewed ?Pathology 2020 ?Narrative & Impression  ?CLINICAL DATA:  Dysfunctional uterine bleeding, menorrhagia with ?regular cycle, LMP 01/17/2022; past history BILATERAL salpingectomy, ?D&C x2, D&E, G6P2A4, amniotic fluid embolism ?  ?EXAM: ?TRANSABDOMINAL AND TRANSVAGINAL ULTRASOUND OF PELVIS ?  ?TECHNIQUE: ?Both transabdominal and transvaginal ultrasound examinations of the ?pelvis were  performed. Transabdominal technique was performed for ?global imaging of the pelvis including uterus, ovaries, adnexal ?regions, and pelvic cul-de-sac. It was necessary to proceed with ?endovaginal exam following the transabdominal exam to visualize the ?uterus and endometrium. ?  ?COMPARISON:  03/31/2018 ?  ?FINDINGS: ?Uterus ?  ?Measurements: 7.4 x 4.8 x 4.8 cm = volume: 88 mL. Anteverted. Normal ?myometrial echogenicity without focal mass. ?  ?Endometrium ?  ?Thickness: 25 mm. Markedly thickened at mid/upper uterine segments. ?No endometrial fluid. ?  ?Right ovary ?  ?Measurements: 2.8 x 1.8 x 2.4 cm = volume: 6.3 mL. Normal morphology ?without mass ?  ?Left ovary ?  ?Measurements: 1.7 x 1.5 x 1.2 cm = volume: 1.6 mL. Normal morphology ?without mass ?  ?Other findings ?  ?No free pelvic fluid. 12 mm LEFT paraovarian cyst; no follow-up ?imaging recommended. No additional masses. ?  ?IMPRESSION: ?Markedly thickened endometrial complex 25 mm thick. ?  ?If bleeding remains unresponsive to hormonal or medical therapy, ?focal lesion work-up with sonohysterogram should be considered. ?Endometrial biopsy should also be considered in pre-menopausal ?patients at high risk for endometrial carcinoma. (Ref: Radiological ?Reasoning: Algorithmic Workup of Abnormal Vaginal Bleeding with ?Endovaginal Sonography and Sonohysterography. AJR 2008; 157:W62-03) ?  ?Remainder of exam unremarkable. ?  ?  ?Electronically Signed ?  By: Lavonia Dana M.D. ?  On: 02/24/2022 10:28 ?   ? ?Assessment ?DUB perimenopause ? ?Plan ?Discussed surgical vs. Medical options and LNIUD and she wishes to schedule Mirena insertion, instructions were given ? ? ? ?Emeterio Reeve ?03/11/2022, 2:02 PM ? ? ? ?

## 2022-03-11 NOTE — Progress Notes (Signed)
Follow up for u/s results ?Pt had good results with Megace use.  ? ?

## 2022-04-01 ENCOUNTER — Ambulatory Visit (INDEPENDENT_AMBULATORY_CARE_PROVIDER_SITE_OTHER): Payer: 59 | Admitting: Obstetrics & Gynecology

## 2022-04-01 VITALS — BP 130/83 | HR 80 | Wt 209.0 lb

## 2022-04-01 DIAGNOSIS — N920 Excessive and frequent menstruation with regular cycle: Secondary | ICD-10-CM

## 2022-04-01 DIAGNOSIS — Z30430 Encounter for insertion of intrauterine contraceptive device: Secondary | ICD-10-CM | POA: Diagnosis not present

## 2022-04-01 DIAGNOSIS — Z01812 Encounter for preprocedural laboratory examination: Secondary | ICD-10-CM | POA: Diagnosis not present

## 2022-04-01 LAB — POCT URINE PREGNANCY: Preg Test, Ur: NEGATIVE

## 2022-04-01 MED ORDER — LEVONORGESTREL 20 MCG/DAY IU IUD
1.0000 | INTRAUTERINE_SYSTEM | Freq: Once | INTRAUTERINE | Status: AC
  Administered 2022-04-01: 1 via INTRAUTERINE

## 2022-04-01 NOTE — Progress Notes (Signed)
Pt has not had recent cycle. ?Here today for IUD insertion, pt has no concerns today.  ? ? ?

## 2022-04-01 NOTE — Progress Notes (Signed)
? ? ?  GYNECOLOGY OFFICE PROCEDURE NOTE ? ?Rhonda Adams is a 46 y.o. [phone] here for Mirena IUD insertion. No GYN concerns.  Last pap smear was on 10/2021 and was normal. ? ?IUD Insertion Procedure Note ?Patient identified, informed consent performed, consent signed.   Discussed risks of irregular bleeding, cramping, infection, malpositioning or misplacement of the IUD outside the uterus which may require further procedure such as laparoscopy. Time out was performed.  Urine pregnancy test negative. ? ?Speculum placed in the vagina.  Cervix visualized.  Cleaned with Betadine x 2.  Grasped anteriorly with a single tooth tenaculum.  Uterus sounded to 9 cm.  IUD placed per manufacturer's recommendations.  Strings trimmed to 3 cm. Tenaculum was removed, good hemostasis noted.  Patient tolerated procedure well.  ? ?Patient was given post-procedure instructions.  She was advised to have backup contraception for one week.  Patient was also asked to check IUD strings periodically and follow up in 4 weeks for IUD check. ? ? ?Emeterio Reeve, MD, FACOG ?Attending [place], Faculty Practice ?[facility] for [employer], [facility] Patient ID: Rhonda Adams, female   DOB: March 06, 1976, 46 y.o.   MRN: [id number] ? ?

## 2022-04-17 ENCOUNTER — Other Ambulatory Visit (HOSPITAL_COMMUNITY)
Admission: RE | Admit: 2022-04-17 | Discharge: 2022-04-17 | Disposition: A | Discharge status: Home / Self Care | Payer: 59 | Source: Ambulatory Visit | Attending: Obstetrics and Gynecology | Admitting: Obstetrics and Gynecology

## 2022-04-17 ENCOUNTER — Encounter: Payer: Self-pay | Admitting: Obstetrics & Gynecology

## 2022-04-17 ENCOUNTER — Ambulatory Visit: Payer: 59 | Admitting: *Deleted

## 2022-04-17 DIAGNOSIS — T839XXA Unspecified complication of genitourinary prosthetic device, implant and graft, initial encounter: Secondary | ICD-10-CM | POA: Insufficient documentation

## 2022-04-17 DIAGNOSIS — N858 Other specified noninflammatory disorders of uterus: Secondary | ICD-10-CM | POA: Diagnosis present

## 2022-04-17 NOTE — Progress Notes (Signed)
Pt came to office to leave sample she passed yesterday afternoon along with her IUD.   ? ?Pt states she did have some cramping yesterday that worsened throughout the day. Pt went to restroom and passed tissue that included her IUD. Pt states she had mild cramping and some bleeding afterward. Pt states she is doing well today.  ? ?Per Dr Elgie Congo, may send contents to pathology for review.  ?Pt has follow up in office with Dr Roselie Awkward on 5/16.  Pt advised if bleeding/pain worsens to contact office or be seen. Otherwise, keep her scheduled appt.  ? ?Pt states understanding and has no further questions.  ?

## 2022-04-17 NOTE — Progress Notes (Signed)
Patient was assessed and managed by nursing staff during this encounter. I have reviewed the chart and agree with the documentation and plan. I have also made any necessary editorial changes. ? ?Griffin Basil, MD ?04/17/2022 3:21 PM   ?

## 2022-04-21 LAB — SURGICAL PATHOLOGY

## 2022-04-29 ENCOUNTER — Ambulatory Visit (INDEPENDENT_AMBULATORY_CARE_PROVIDER_SITE_OTHER): Payer: 59 | Admitting: Obstetrics & Gynecology

## 2022-04-29 ENCOUNTER — Encounter: Payer: Self-pay | Admitting: Obstetrics & Gynecology

## 2022-04-29 VITALS — BP 130/85 | HR 87 | Ht 65.0 in | Wt 214.0 lb

## 2022-04-29 DIAGNOSIS — Z30430 Encounter for insertion of intrauterine contraceptive device: Secondary | ICD-10-CM

## 2022-04-29 DIAGNOSIS — T839XXS Unspecified complication of genitourinary prosthetic device, implant and graft, sequela: Secondary | ICD-10-CM | POA: Diagnosis not present

## 2022-04-29 MED ORDER — LEVONORGESTREL 20 MCG/DAY IU IUD
1.0000 | INTRAUTERINE_SYSTEM | Freq: Once | INTRAUTERINE | Status: AC
  Administered 2022-04-29: 1 via INTRAUTERINE

## 2022-04-29 NOTE — Progress Notes (Addendum)
46 y.o GYN presents for FU of Contraception for AUB.  The IUD spontaneously expelled 04/16/2022.  She is not having any bleeding at this time.  ? ?Administrations This Visit   ? ? levonorgestrel (MIRENA) 20 MCG/DAY IUD 1 each   ? ? Admin Date ?04/29/2022 Action ?Given Dose ?1 each Route ?Intrauterine Administered By ?Tamela Oddi, RMA  ? ?  ?  ? ?  ?  ?

## 2022-04-29 NOTE — Progress Notes (Signed)
? ? ?  GYNECOLOGY OFFICE PROCEDURE NOTE ? ?Rhonda Adams is a 46 y.o. [phone] here for Mirena IUD insertion. Her MIrena expelled with benign endometrial tissue this month and she wants to have another placed ? ?IUD Insertion Procedure Note ?Patient identified, informed consent performed, consent signed.   Discussed risks of irregular bleeding, cramping, infection, malpositioning or misplacement of the IUD outside the uterus which may require further procedure such as laparoscopy. Time out was performed.   ? ?Speculum placed in the vagina.  Cervix visualized.  Cleaned with Betadine x 2.  Grasped anteriorly with a single tooth tenaculum.  Uterus sounded to 8 cm.  Mirena IUD placed per manufacturer's recommendations.  Strings trimmed to 3 cm. Tenaculum was removed, good hemostasis noted.  Patient tolerated procedure well.  ? ?Patient was given post-procedure instructions.  She was advised to have backup contraception for one week.  Patient was also asked to check IUD strings periodically and follow up in 4 weeks for IUD check. ? ? ?Emeterio Reeve, MD, FACOG ?Attending [place], Faculty Practice ?Center for [employer], [facility] Patient ID: MECHELE KITTLESON, female   DOB: 1976/06/27, 46 y.o.   MRN: [id number] ? ?

## 2022-05-23 ENCOUNTER — Encounter: Payer: Self-pay | Admitting: Obstetrics & Gynecology

## 2022-05-27 ENCOUNTER — Ambulatory Visit: Payer: 59 | Admitting: Obstetrics & Gynecology

## 2022-06-24 ENCOUNTER — Ambulatory Visit (INDEPENDENT_AMBULATORY_CARE_PROVIDER_SITE_OTHER): Payer: 59 | Admitting: Obstetrics & Gynecology

## 2022-06-24 DIAGNOSIS — N938 Other specified abnormal uterine and vaginal bleeding: Secondary | ICD-10-CM

## 2022-06-24 NOTE — Progress Notes (Signed)
Patient ID: Rhonda Adams, female   DOB: 05/06/1976, 46 y.o.   MRN: [id number]  Chief Complaint  Patient presents with   Follow-up    HPI Rhonda Adams is a 46 y.o. female.  [id number] No LMP recorded. Spotting now after IUD insertion HPI  Past Medical History:  Diagnosis Date   Amniotic fluid embolism during pregnancy    Broken ankle    DIC (disseminated intravascular coagulation) ([place]) 2012   Migraine    Ovarian cyst    Preterm labor 2012    Past Surgical History:  Procedure Laterality Date   ARTHROSCOPIC REPAIR ACL     left   DILATION AND CURETTAGE OF UTERUS     DILATION AND CURETTAGE OF UTERUS N/A 12/29/2018   Procedure: DILATATION AND CURETTAGE WITH ATTEMPTED ENDOMETRIAL MINERVA ABLATION;  Surgeon: Emily Filbert, MD;  Location: [facility];  Service: Gynecology;  Laterality: N/A;   DILATION AND EVACUATION N/A 09/04/2013   Procedure: exam under anesthesia placement Bakri balloon;  Surgeon: Woodroe Mode, MD;  Location: [facility];  Service: Gynecology;  Laterality: N/A;   LAPAROSCOPIC BILATERAL SALPINGECTOMY Bilateral 04/22/2016   Procedure: LAPAROSCOPIC BILATERAL SALPINGECTOMY;  Surgeon: Emily Filbert, MD;  Location: [facility];  Service: Gynecology;  Laterality: Bilateral;   MISCARRIAGE     ORIF ANKLE FRACTURE Right 10/07/2014   Procedure: OPEN REDUCTION INTERNAL FIXATION (ORIF) ANKLE FRACTURE;  Surgeon: Meredith Pel, MD;  Location: [facility];  Service: Orthopedics;  Laterality: Right;    Family History  Problem Relation Age of Onset   Cancer Mother 26       BREAST   Breast cancer Mother    Hypertension Father    Heart disease Maternal Grandmother    Diabetes Maternal Grandfather    Stroke Paternal Grandfather    Hearing loss Neg Hx     Social History Social History   Tobacco Use   Smoking status: Never   Smokeless tobacco: Never  Substance Use Topics   Alcohol use: Yes    Comment: rare   Drug use: No    Allergies  Allergen Reactions   Bacon Flavor  Nausea And Vomiting    Pt states she is allergic to bacon; not bacon flavor.    Current Outpatient Medications  Medication Sig Dispense Refill   Ascorbic Acid (VITAMIN C) 100 MG tablet Take 100 mg by mouth daily.     aspirin EC 81 MG tablet Take 81 mg by mouth daily.     cholecalciferol (VITAMIN D3) 25 MCG (1000 UT) tablet Take 1,000 Units by mouth daily.     ibuprofen (ADVIL,MOTRIN) 200 MG tablet Take 400 mg by mouth every 6 (six) hours as needed for headache or moderate pain.     Multiple Vitamins-Minerals (MULTIVITAMIN WITH MINERALS) tablet Take 1 tablet by mouth daily.     No current facility-administered medications for this visit.    Review of Systems Review of Systems  Constitutional: Negative.   Genitourinary:  Positive for pelvic pain (occasional cramps) and vaginal bleeding. Negative for vaginal discharge.    Blood pressure (!) 139/94, pulse 80, weight 225 lb (102.1 kg).  Physical Exam Physical Exam Vitals and nursing note reviewed. Exam conducted with a chaperone present.  Constitutional:      Appearance: Normal appearance.  Genitourinary:    General: Normal vulva.     Exam position: Lithotomy position.     Vagina: Bleeding present. No vaginal discharge.     Comments: String 2 cm at  the cervix Musculoskeletal:        General: Normal range of motion.  Neurological:     General: No focal deficit present.     Mental Status: She is alert and oriented to person, place, and time.     Data Reviewed   Assessment IUD in normal position DUB (dysfunctional uterine bleeding)  Plan Annual exam as indicated    Rhonda Adams 06/24/2022, 3:35 PM

## 2022-12-22 ENCOUNTER — Encounter: Payer: 59 | Admitting: Internal Medicine

## 2022-12-24 DIAGNOSIS — R739 Hyperglycemia, unspecified: Secondary | ICD-10-CM | POA: Insufficient documentation

## 2022-12-24 DIAGNOSIS — R7303 Prediabetes: Secondary | ICD-10-CM | POA: Insufficient documentation

## 2022-12-24 NOTE — Patient Instructions (Addendum)
Flu immunization administered today.      Blood work was ordered.   The lab is on the first floor.    Medications changes include :   fluoxetine 20 mg daily     Return in about 6 weeks (around 02/05/2023) for anxiety.   Health Maintenance, Female Adopting a healthy lifestyle and getting preventive care are important in promoting health and wellness. Ask your health care provider about: The right schedule for you to have regular tests and exams. Things you can do on your own to prevent diseases and keep yourself healthy. What should I know about diet, weight, and exercise? Eat a healthy diet  Eat a diet that includes plenty of vegetables, fruits, low-fat dairy products, and lean protein. Do not eat a lot of foods that are high in solid fats, added sugars, or sodium. Maintain a healthy weight Body mass index (BMI) is used to identify weight problems. It estimates body fat based on height and weight. Your health care provider can help determine your BMI and help you achieve or maintain a healthy weight. Get regular exercise Get regular exercise. This is one of the most important things you can do for your health. Most adults should: Exercise for at least 150 minutes each week. The exercise should increase your heart rate and make you sweat (moderate-intensity exercise). Do strengthening exercises at least twice a week. This is in addition to the moderate-intensity exercise. Spend less time sitting. Even light physical activity can be beneficial. Watch cholesterol and blood lipids Have your blood tested for lipids and cholesterol at 47 years of age, then have this test every 5 years. Have your cholesterol levels checked more often if: Your lipid or cholesterol levels are high. You are older than 47 years of age. You are at high risk for heart disease. What should I know about cancer screening? Depending on your health history and family history, you may need to have cancer  screening at various ages. This may include screening for: Breast cancer. Cervical cancer. Colorectal cancer. Skin cancer. Lung cancer. What should I know about heart disease, diabetes, and high blood pressure? Blood pressure and heart disease High blood pressure causes heart disease and increases the risk of stroke. This is more likely to develop in people who have high blood pressure readings or are overweight. Have your blood pressure checked: Every 3-5 years if you are 50-18 years of age. Every year if you are 36 years old or older. Diabetes Have regular diabetes screenings. This checks your fasting blood sugar level. Have the screening done: Once every three years after age 79 if you are at a normal weight and have a low risk for diabetes. More often and at a younger age if you are overweight or have a high risk for diabetes. What should I know about preventing infection? Hepatitis B If you have a higher risk for hepatitis B, you should be screened for this virus. Talk with your health care provider to find out if you are at risk for hepatitis B infection. Hepatitis C Testing is recommended for: Everyone born from [birth year] through 1965. Anyone with known risk factors for hepatitis C. Sexually transmitted infections (STIs) Get screened for STIs, including gonorrhea and chlamydia, if: You are sexually active and are younger than 47 years of age. You are older than 47 years of age and your health care provider tells you that you are at risk for this type of infection. Your sexual activity has  changed since you were last screened, and you are at increased risk for chlamydia or gonorrhea. Ask your health care provider if you are at risk. Ask your health care provider about whether you are at high risk for HIV. Your health care provider may recommend a prescription medicine to help prevent HIV infection. If you choose to take medicine to prevent HIV, you should first get tested for HIV. You  should then be tested every 3 months for as long as you are taking the medicine. Pregnancy If you are about to stop having your period (premenopausal) and you may become pregnant, seek counseling before you get pregnant. Take 400 to 800 micrograms (mcg) of folic acid every day if you become pregnant. Ask for birth control (contraception) if you want to prevent pregnancy. Osteoporosis and menopause Osteoporosis is a disease in which the bones lose minerals and strength with aging. This can result in bone fractures. If you are 9 years old or older, or if you are at risk for osteoporosis and fractures, ask your health care provider if you should: Be screened for bone loss. Take a calcium or vitamin D supplement to lower your risk of fractures. Be given hormone replacement therapy (HRT) to treat symptoms of menopause. Follow these instructions at home: Alcohol use Do not drink alcohol if: Your health care provider tells you not to drink. You are pregnant, may be pregnant, or are planning to become pregnant. If you drink alcohol: Limit how much you have to: 0-1 drink a day. Know how much alcohol is in your drink. In the [place] one drink equals one 12 oz bottle of beer (355 mL), one 5 oz glass of wine (148 mL), or one 1 oz glass of hard liquor (44 mL). Lifestyle Do not use any products that contain nicotine or tobacco. These products include cigarettes, chewing tobacco, and vaping devices, such as e-cigarettes. If you need help quitting, ask your health care provider. Do not use street drugs. Do not share needles. Ask your health care provider for help if you need support or information about quitting drugs. General instructions Schedule regular health, dental, and eye exams. Stay current with your vaccines. Tell your health care provider if: You often feel depressed. You have ever been abused or do not feel safe at home. Summary Adopting a healthy lifestyle and getting preventive care are  important in promoting health and wellness. Follow your health care provider's instructions about healthy diet, exercising, and getting tested or screened for diseases. Follow your health care provider's instructions on monitoring your cholesterol and blood pressure. This information is not intended to replace advice given to you by your health care provider. Make sure you discuss any questions you have with your health care provider. Document Revised: 04/22/2021 Document Reviewed: 04/22/2021 Elsevier Patient Education  [place].

## 2022-12-24 NOTE — Progress Notes (Unsigned)
Subjective:    Patient ID: Rhonda Adams, female    DOB: 1976-12-14, 47 y.o.   MRN: [id number]      HPI Rhonda Adams is here for a Physical exam.   Lots of stress.  Helping to care for father, father in law has cancer, has two young kids stress level a little better since last month.  Last month she was having palpitations frequently from stress.  She has difficulty sleeping at times.  She does feel anxious.      Medications and allergies reviewed with patient and updated if appropriate.  Current Outpatient Medications on File Prior to Visit  Medication Sig Dispense Refill   Ascorbic Acid (VITAMIN C) 100 MG tablet Take 100 mg by mouth daily.     aspirin EC 81 MG tablet Take 81 mg by mouth daily.     cholecalciferol (VITAMIN D3) 25 MCG (1000 UT) tablet Take 1,000 Units by mouth daily.     ibuprofen (ADVIL,MOTRIN) 200 MG tablet Take 400 mg by mouth every 6 (six) hours as needed for headache or moderate pain.     levonorgestrel (MIRENA, 52 MG,) 20 MCG/DAY IUD 1 each by Intrauterine route once.     Multiple Vitamins-Minerals (MULTIVITAMIN WITH MINERALS) tablet Take 1 tablet by mouth daily.     No current facility-administered medications on file prior to visit.    Review of Systems  Constitutional:  Negative for fever.  Eyes:  Negative for visual disturbance.  Respiratory:  Negative for cough, shortness of breath and wheezing.   Cardiovascular:  Positive for palpitations (occ). Negative for chest pain and leg swelling.  Gastrointestinal:  Positive for nausea (occ). Negative for abdominal pain, blood in stool, constipation and diarrhea.       Occ gerd  Genitourinary:  Negative for dysuria.  Musculoskeletal:  Positive for arthralgias (knees - intermittent), back pain (mild), neck pain and neck stiffness.  Skin:  Negative for rash.  Neurological:  Positive for headaches (from stress/neck stiffness and migraines). Negative for dizziness and light-headedness.  Psychiatric/Behavioral:   Positive for sleep disturbance (some days). Negative for dysphoric mood. The patient is nervous/anxious.        Objective:   Vitals:   12/25/22 0943  BP: 138/84  Pulse: 68  Temp: 98.6 F (37 C)  SpO2: 98%   Filed Weights   12/25/22 0943  Weight: 219 lb (99.3 kg)   Body mass index is 36.44 kg/m.  BP Readings from Last 3 Encounters:  12/25/22 138/84  06/24/22 (!) 139/94  04/29/22 130/85    Wt Readings from Last 3 Encounters:  12/25/22 219 lb (99.3 kg)  06/24/22 225 lb (102.1 kg)  04/29/22 214 lb (97.1 kg)       Physical Exam Constitutional: She appears well-developed and well-nourished. No distress.  HENT:  Head: Normocephalic and atraumatic.  Right Ear: External ear normal. Normal ear canal and TM Left Ear: External ear normal.  Normal ear canal and TM Mouth/Throat: Oropharynx is clear and moist.  Eyes: Conjunctivae normal.  Neck: Neck supple. No tracheal deviation present. No thyromegaly present.  No carotid bruit  Cardiovascular: Normal rate, regular rhythm and normal heart sounds.   No murmur heard.  No edema. Pulmonary/Chest: Effort normal and breath sounds normal. No respiratory distress. She has no wheezes. She has no rales.  Breast: deferred   Abdominal: Soft. She exhibits no distension. There is no tenderness.  Lymphadenopathy: She has no cervical adenopathy.  Skin: Skin is warm and dry. She is  not diaphoretic.  Psychiatric: She has a normal mood and affect. Her behavior is normal.     Lab Results  Component Value Date   WBC 4.7 09/10/2021   HGB 13.1 09/10/2021   HCT 39.7 09/10/2021   PLT 233.0 09/10/2021   GLUCOSE 86 09/10/2021   CHOL 228 (H) 09/10/2021   TRIG 138.0 09/10/2021   HDL 41.30 09/10/2021   LDLCALC 159 (H) 09/10/2021   ALT 16 09/10/2021   AST 14 09/10/2021   NA 140 09/10/2021   K 4.0 09/10/2021   CL 104 09/10/2021   CREATININE 0.85 09/10/2021   BUN 15 09/10/2021   CO2 29 09/10/2021   TSH 0.97 09/10/2021   INR 1.13  09/04/2013   HGBA1C 5.6 09/10/2021         Assessment & Plan:   Physical exam: Screening blood work  ordered Exercise  not regular Weight  encouraged weight loss Substance abuse  none   Reviewed recommended immunizations.   Health Maintenance  Topic Date Due   COLONOSCOPY (Pts 45-10yr Insurance coverage will need to be confirmed)  Never done   INFLUENZA VACCINE  07/15/2022   COVID-19 Vaccine (1) 01/10/2023 (Originally 11/17/1981)   DTaP/Tdap/Td (3 - Td or Tdap) 09/06/2023   MAMMOGRAM  10/24/2023   PAP SMEAR-Modifier  11/05/2026   Hepatitis C Screening  Completed   HIV Screening  Completed   HPV VACCINES  Aged Out      Referred for colonoscopy    See Problem List for Assessment and Plan of chronic medical problems.

## 2022-12-25 ENCOUNTER — Encounter: Payer: Self-pay | Admitting: Internal Medicine

## 2022-12-25 ENCOUNTER — Ambulatory Visit (INDEPENDENT_AMBULATORY_CARE_PROVIDER_SITE_OTHER): Payer: 59 | Admitting: Internal Medicine

## 2022-12-25 VITALS — BP 138/84 | HR 68 | Temp 98.6°F | Ht 65.0 in | Wt 219.0 lb

## 2022-12-25 DIAGNOSIS — E782 Mixed hyperlipidemia: Secondary | ICD-10-CM | POA: Diagnosis not present

## 2022-12-25 DIAGNOSIS — Z23 Encounter for immunization: Secondary | ICD-10-CM

## 2022-12-25 DIAGNOSIS — Z0000 Encounter for general adult medical examination without abnormal findings: Secondary | ICD-10-CM

## 2022-12-25 DIAGNOSIS — F419 Anxiety disorder, unspecified: Secondary | ICD-10-CM | POA: Diagnosis not present

## 2022-12-25 DIAGNOSIS — R739 Hyperglycemia, unspecified: Secondary | ICD-10-CM | POA: Diagnosis not present

## 2022-12-25 LAB — LIPID PANEL
Cholesterol: 204 mg/dL — ABNORMAL HIGH (ref 0–200)
HDL: 40.7 mg/dL (ref >39.00)
LDL Cholesterol: 139 mg/dL — ABNORMAL HIGH (ref 0–99)
NonHDL: 163.39
Total CHOL/HDL Ratio: 5
Triglycerides: 123 mg/dL (ref 0.0–149.0)
VLDL: 24.6 mg/dL (ref 0.0–40.0)

## 2022-12-25 LAB — CBC WITH DIFFERENTIAL/PLATELET
Basophils Absolute: 0 10*3/uL (ref 0.0–0.1)
Basophils Relative: 0.5 % (ref 0.0–3.0)
Eosinophils Absolute: 0.1 10*3/uL (ref 0.0–0.7)
Eosinophils Relative: 0.9 % (ref 0.0–5.0)
HCT: 43.4 % (ref 36.0–46.0)
Hemoglobin: 14.6 g/dL (ref 12.0–15.0)
Lymphocytes Relative: 19.9 % (ref 12.0–46.0)
Lymphs Abs: 1.6 10*3/uL (ref 0.7–4.0)
MCHC: 33.5 g/dL (ref 30.0–36.0)
MCV: 87.9 fl (ref 78.0–100.0)
Monocytes Absolute: 0.4 10*3/uL (ref 0.1–1.0)
Monocytes Relative: 5.3 % (ref 3.0–12.0)
Neutro Abs: 5.9 10*3/uL (ref 1.4–7.7)
Neutrophils Relative %: 73.4 % (ref 43.0–77.0)
Platelets: 232 10*3/uL (ref 150.0–400.0)
RBC: 4.94 Mil/uL (ref 3.87–5.11)
RDW: 13.4 % (ref 11.5–15.5)
WBC: 8.1 10*3/uL (ref 4.0–10.5)

## 2022-12-25 LAB — COMPREHENSIVE METABOLIC PANEL
ALT: 16 U/L (ref 0–35)
AST: 15 U/L (ref 0–37)
Albumin: 4 g/dL (ref 3.5–5.2)
Alkaline Phosphatase: 71 U/L (ref 39–117)
BUN: 22 mg/dL (ref 6–23)
CO2: 27 mEq/L (ref 19–32)
Calcium: 9.1 mg/dL (ref 8.4–10.5)
Chloride: 105 mEq/L (ref 96–112)
Creatinine, Ser: 0.79 mg/dL (ref 0.40–1.20)
GFR: 89.9 mL/min (ref >60.00)
Glucose, Bld: 94 mg/dL (ref 70–99)
Potassium: 4.2 mEq/L (ref 3.5–5.1)
Sodium: 140 mEq/L (ref 135–145)
Total Bilirubin: 0.4 mg/dL (ref 0.2–1.2)
Total Protein: 7 g/dL (ref 6.0–8.3)

## 2022-12-25 LAB — TSH: TSH: 1.18 u[IU]/mL (ref 0.35–5.50)

## 2022-12-25 LAB — HEMOGLOBIN A1C: Hgb A1c MFr Bld: 5.8 % (ref 4.6–6.5)

## 2022-12-25 MED ORDER — FLUOXETINE HCL 20 MG PO CAPS: 20.0000 mg | ORAL_CAPSULE | Freq: Every day | ORAL | 3 refills | Status: DC

## 2022-12-25 NOTE — Assessment & Plan Note (Signed)
Chronic Check a1c Low sugar / carb diet Stressed regular exercise  

## 2022-12-25 NOTE — Assessment & Plan Note (Signed)
Acute on chronic Worse over several months due to family and life stresses Advised regular exercise, taking time for herself Discussed medication and she would like to try something Start fluoxetine 20 mg daily - discussed possible side effects F/u in 6 weeks

## 2022-12-25 NOTE — Assessment & Plan Note (Signed)
Chronic Regular exercise and healthy diet encouraged Check lipid panel, CMP, TSH Continue lifestyle control

## 2023-02-04 ENCOUNTER — Encounter: Payer: Self-pay | Admitting: Internal Medicine

## 2023-02-04 NOTE — Patient Instructions (Signed)
       Medications changes include :   none      Return in about 6 months (around 08/06/2023) for follow up.

## 2023-02-04 NOTE — Progress Notes (Unsigned)
Subjective:    Patient ID: Rhonda Adams, female    DOB: [birth year], 47 y.o.   MRN: [id number]     HPI [name] is here for follow up of her chronic medical problems, including anxiety  6 weeks ago we started fluoxetine 20 mg daily.  Less stress headaches.  Maybe a  little fatigue esp in the evening.  Ok during day and at night crashes.  No other side effects.  She does feel it has helped.      Medications and allergies reviewed with patient and updated if appropriate.  Current Outpatient Medications on File Prior to Visit  Medication Sig Dispense Refill   Ascorbic Acid (VITAMIN C) 100 MG tablet Take 100 mg by mouth daily.     aspirin EC 81 MG tablet Take 81 mg by mouth daily.     cholecalciferol (VITAMIN D3) 25 MCG (1000 UT) tablet Take 1,000 Units by mouth daily.     FLUoxetine (PROZAC) 20 MG capsule Take 1 capsule (20 mg total) by mouth daily. 30 capsule 3   ibuprofen (ADVIL,MOTRIN) 200 MG tablet Take 400 mg by mouth every 6 (six) hours as needed for headache or moderate pain.     levonorgestrel (MIRENA, 52 MG,) 20 MCG/DAY IUD 1 each by Intrauterine route once.     Multiple Vitamins-Minerals (MULTIVITAMIN WITH MINERALS) tablet Take 1 tablet by mouth daily.     No current facility-administered medications on file prior to visit.     Review of Systems  Respiratory:  Negative for shortness of breath.   Cardiovascular:  Positive for palpitations (dec, rare). Negative for chest pain.  Gastrointestinal:  Negative for constipation, diarrhea and nausea.  Neurological:  Positive for headaches (occ stress headaches).       Objective:   Vitals:   02/05/23 1150  BP: 120/76  Pulse: 73  Temp: 99.1 F (37.3 C)  SpO2: 98%   BP Readings from Last 3 Encounters:  02/05/23 120/76  12/25/22 138/84  06/24/22 (!) 139/94   Wt Readings from Last 3 Encounters:  02/05/23 214 lb (97.1 kg)  12/25/22 219 lb (99.3 kg)  06/24/22 225 lb (102.1 kg)   Body mass index is 35.61 kg/m.     Physical Exam Constitutional:      General: She is not in acute distress.    Appearance: Normal appearance. She is not ill-appearing.  HENT:     Head: Normocephalic and atraumatic.  Skin:    General: Skin is warm and dry.     Findings: No rash.  Neurological:     Mental Status: She is alert. Mental status is at baseline.  Psychiatric:        Mood and Affect: Mood normal.        Behavior: Behavior normal.        Thought Content: Thought content normal.        Judgment: Judgment normal.        Lab Results  Component Value Date   WBC 8.1 12/25/2022   HGB 14.6 12/25/2022   HCT 43.4 12/25/2022   PLT 232.0 12/25/2022   GLUCOSE 94 12/25/2022   CHOL 204 (H) 12/25/2022   TRIG 123.0 12/25/2022   HDL 40.70 12/25/2022   LDLCALC 139 (H) 12/25/2022   ALT 16 12/25/2022   AST 15 12/25/2022   NA 140 12/25/2022   K 4.2 12/25/2022   CL 105 12/25/2022   CREATININE 0.79 12/25/2022   BUN 22 12/25/2022   CO2 27 12/25/2022  TSH 1.18 12/25/2022   INR 1.13 09/04/2013   HGBA1C 5.8 12/25/2022     Assessment & Plan:    See Problem List for Assessment and Plan of chronic medical problems.

## 2023-02-05 ENCOUNTER — Ambulatory Visit: Payer: 59 | Admitting: Internal Medicine

## 2023-02-05 ENCOUNTER — Encounter: Payer: Self-pay | Admitting: Internal Medicine

## 2023-02-05 VITALS — BP 120/76 | HR 73 | Temp 99.1°F | Ht 65.0 in | Wt 214.0 lb

## 2023-02-05 DIAGNOSIS — F419 Anxiety disorder, unspecified: Secondary | ICD-10-CM

## 2023-02-05 NOTE — Assessment & Plan Note (Signed)
Chronic Improved, controlled Continue fluoxetine 20 mg daily

## 2023-03-23 ENCOUNTER — Other Ambulatory Visit: Payer: Self-pay | Admitting: Internal Medicine

## 2023-07-15 ENCOUNTER — Encounter (INDEPENDENT_AMBULATORY_CARE_PROVIDER_SITE_OTHER): Payer: Self-pay

## 2023-07-22 ENCOUNTER — Other Ambulatory Visit: Payer: Self-pay | Admitting: Internal Medicine

## 2023-07-27 NOTE — Progress Notes (Unsigned)
Subjective:    Patient ID: Rhonda Adams, female    DOB: March 31, 1976, 47 y.o.   MRN: [id number]     HPI Daryle is here for follow up of her chronic medical problems.  Overall doing well-no concerns  Medications and allergies reviewed with patient and updated if appropriate.  Current Outpatient Medications on File Prior to Visit  Medication Sig Dispense Refill   Ascorbic Acid (VITAMIN C) 100 MG tablet Take 100 mg by mouth daily.     aspirin EC 81 MG tablet Take 81 mg by mouth daily.     cholecalciferol (VITAMIN D3) 25 MCG (1000 UT) tablet Take 1,000 Units by mouth daily.     FLUoxetine (PROZAC) 20 MG capsule TAKE 1 CAPSULE(20 MG) BY MOUTH DAILY 30 capsule 3   ibuprofen (ADVIL,MOTRIN) 200 MG tablet Take 400 mg by mouth every 6 (six) hours as needed for headache or moderate pain.     levonorgestrel (MIRENA, 52 MG,) 20 MCG/DAY IUD 1 each by Intrauterine route once.     Multiple Vitamins-Minerals (MULTIVITAMIN WITH MINERALS) tablet Take 1 tablet by mouth daily.     No current facility-administered medications on file prior to visit.     Review of Systems  Constitutional:  Negative for fever.  Respiratory:  Negative for cough, shortness of breath and wheezing.   Cardiovascular:  Negative for chest pain, palpitations and leg swelling.  Neurological:  Positive for headaches. Negative for light-headedness.  Psychiatric/Behavioral:  Positive for sleep disturbance (some days worse than others).        Objective:   Vitals:   07/28/23 1523  BP: 124/76  Pulse: 73  Temp: 98.6 F (37 C)  SpO2: 99%   BP Readings from Last 3 Encounters:  07/28/23 124/76  02/05/23 120/76  12/25/22 138/84   Wt Readings from Last 3 Encounters:  07/28/23 217 lb (98.4 kg)  02/05/23 214 lb (97.1 kg)  12/25/22 219 lb (99.3 kg)   Body mass index is 36.11 kg/m.    Physical Exam Constitutional:      General: She is not in acute distress.    Appearance: Normal appearance.  HENT:     Head:  Normocephalic and atraumatic.  Eyes:     Conjunctiva/sclera: Conjunctivae normal.  Cardiovascular:     Rate and Rhythm: Normal rate and regular rhythm.     Heart sounds: Normal heart sounds.  Pulmonary:     Effort: Pulmonary effort is normal. No respiratory distress.     Breath sounds: Normal breath sounds. No wheezing.  Musculoskeletal:     Cervical back: Neck supple.     Right lower leg: No edema.     Left lower leg: No edema.  Lymphadenopathy:     Cervical: No cervical adenopathy.  Skin:    General: Skin is warm and dry.     Findings: No rash.  Neurological:     Mental Status: She is alert. Mental status is at baseline.  Psychiatric:        Mood and Affect: Mood normal.        Behavior: Behavior normal.        Lab Results  Component Value Date   WBC 8.1 12/25/2022   HGB 14.6 12/25/2022   HCT 43.4 12/25/2022   PLT 232.0 12/25/2022   GLUCOSE 94 12/25/2022   CHOL 204 (H) 12/25/2022   TRIG 123.0 12/25/2022   HDL 40.70 12/25/2022   LDLCALC 139 (H) 12/25/2022   ALT 16 12/25/2022  AST 15 12/25/2022   NA 140 12/25/2022   K 4.2 12/25/2022   CL 105 12/25/2022   CREATININE 0.79 12/25/2022   BUN 22 12/25/2022   CO2 27 12/25/2022   TSH 1.18 12/25/2022   INR 1.13 09/04/2013   HGBA1C 5.8 12/25/2022     Assessment & Plan:    See Problem List for Assessment and Plan of chronic medical problems.

## 2023-07-27 NOTE — Patient Instructions (Addendum)
      Blood work was ordered.   The lab is on the first floor.    Medications changes include :   None     Return in about 6 months (around 01/28/2024) for Physical Exam.

## 2023-07-28 ENCOUNTER — Ambulatory Visit: Payer: 59 | Admitting: Internal Medicine

## 2023-07-28 VITALS — BP 124/76 | HR 73 | Temp 98.6°F | Ht 65.0 in | Wt 217.0 lb

## 2023-07-28 DIAGNOSIS — F419 Anxiety disorder, unspecified: Secondary | ICD-10-CM

## 2023-07-28 DIAGNOSIS — R7303 Prediabetes: Secondary | ICD-10-CM | POA: Diagnosis not present

## 2023-07-28 LAB — POCT GLYCOSYLATED HEMOGLOBIN (HGB A1C)
HbA1c POC (<> result, manual entry): 5.4 % (ref 4.0–5.6)
HbA1c, POC (controlled diabetic range): 5.4 % (ref 0.0–7.0)
HbA1c, POC (prediabetic range): 5.4 % (ref 5.7–6.4)
Hemoglobin A1C: 5.4 % (ref 4.0–5.6)

## 2023-07-28 NOTE — Assessment & Plan Note (Addendum)
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

## 2023-07-28 NOTE — Assessment & Plan Note (Signed)
Chronic Improved, controlled Continue fluoxetine 20 mg daily

## 2023-08-05 ENCOUNTER — Ambulatory Visit: Payer: 59 | Admitting: Internal Medicine

## 2023-11-07 ENCOUNTER — Other Ambulatory Visit: Payer: Self-pay | Admitting: Internal Medicine

## 2024-01-25 ENCOUNTER — Encounter: Payer: Self-pay | Admitting: Internal Medicine

## 2024-01-25 NOTE — Progress Notes (Signed)
Subjective:    Patient ID: Rhonda Adams, female    DOB: [birth year], 48 y.o.   MRN: [id number]      HPI Rhonda Adams is here for a Physical exam and her chronic medical problems.    Dad passed away in Dec 09, 2024.   Working full time now Retail banker.  Has a puppy now.    Has gained weight with the stress while she was caring for her dad.  She think she has started to lose it with lifestyle changes, but she is concerned and frustrated that she has not seen much of a change.    Medications and allergies reviewed with patient and updated if appropriate.  Current Outpatient Medications on File Prior to Visit  Medication Sig Dispense Refill   Ascorbic Acid (VITAMIN C) 100 MG tablet Take 100 mg by mouth daily.     aspirin EC 81 MG tablet Take 81 mg by mouth daily.     cholecalciferol (VITAMIN D3) 25 MCG (1000 UT) tablet Take 1,000 Units by mouth daily.     FLUoxetine (PROZAC) 20 MG capsule TAKE 1 CAPSULE BY MOUTH DAILY 90 capsule 1   ibuprofen (ADVIL,MOTRIN) 200 MG tablet Take 400 mg by mouth every 6 (six) hours as needed for headache or moderate pain.     levonorgestrel (MIRENA, 52 MG,) 20 MCG/DAY IUD 1 each by Intrauterine route once.     Multiple Vitamins-Minerals (MULTIVITAMIN WITH MINERALS) tablet Take 1 tablet by mouth daily.     No current facility-administered medications on file prior to visit.    Review of Systems  Constitutional:  Negative for fever.  Eyes:  Negative for visual disturbance.  Respiratory:  Negative for cough, shortness of breath and wheezing.   Cardiovascular:  Negative for chest pain, palpitations and leg swelling.  Gastrointestinal:  Negative for abdominal pain, blood in stool, constipation and diarrhea.       No gerd  Genitourinary:  Negative for dysuria.  Musculoskeletal:  Positive for back pain (lower back at times). Negative for arthralgias.  Skin:  Negative for rash.  Neurological:  Positive for headaches (posterior from neck). Negative for  light-headedness.  Psychiatric/Behavioral:  Negative for dysphoric mood. The patient is not nervous/anxious.        Objective:   Vitals:   01/26/24 1537  BP: 122/74  Pulse: 73  Temp: 98.3 F (36.8 C)  SpO2: 98%   Filed Weights   01/26/24 1537  Weight: 229 lb (103.9 kg)   Body mass index is 38.11 kg/m.  BP Readings from Last 3 Encounters:  01/26/24 122/74  07/28/23 124/76  02/05/23 120/76    Wt Readings from Last 3 Encounters:  01/26/24 229 lb (103.9 kg)  07/28/23 217 lb (98.4 kg)  02/05/23 214 lb (97.1 kg)       Physical Exam Constitutional: She appears well-developed and well-nourished. No distress.  HENT:  Head: Normocephalic and atraumatic.  Right Ear: External ear normal. Normal ear canal and TM Left Ear: External ear normal.  Normal ear canal and TM Mouth/Throat: Oropharynx is clear and moist.  Eyes: Conjunctivae normal.  Neck: Neck supple. No tracheal deviation present. No thyromegaly present.  No carotid bruit  Cardiovascular: Normal rate, regular rhythm and normal heart sounds.   No murmur heard.  No edema. Pulmonary/Chest: Effort normal and breath sounds normal. No respiratory distress. She has no wheezes. She has no rales.  Breast: deferred   Abdominal: Soft. She exhibits no distension. There is no tenderness.  Lymphadenopathy:  She has no cervical adenopathy.  Skin: Skin is warm and dry. She is not diaphoretic.  Psychiatric: She has a normal mood and affect. Her behavior is normal.     Lab Results  Component Value Date   WBC 8.1 12/25/2022   HGB 14.6 12/25/2022   HCT 43.4 12/25/2022   PLT 232.0 12/25/2022   GLUCOSE 94 12/25/2022   CHOL 204 (H) 12/25/2022   TRIG 123.0 12/25/2022   HDL 40.70 12/25/2022   LDLCALC 139 (H) 12/25/2022   ALT 16 12/25/2022   AST 15 12/25/2022   NA 140 12/25/2022   K 4.2 12/25/2022   CL 105 12/25/2022   CREATININE 0.79 12/25/2022   BUN 22 12/25/2022   CO2 27 12/25/2022   TSH 1.18 12/25/2022   INR 1.13  09/04/2013   HGBA1C 5.4 07/28/2023   HGBA1C 5.4 07/28/2023   HGBA1C 5.4 07/28/2023   HGBA1C 5.4 07/28/2023         Assessment & Plan:   Physical exam: Screening blood work  ordered Exercise  not in past few months - getting more steps in daily Weight  obese - discussed weight loss Substance abuse  none   Reviewed recommended immunizations.  Tdap today   Health Maintenance  Topic Date Due   COVID-19 Vaccine (1) Never done   Colonoscopy  Never done   DTaP/Tdap/Td (3 - Td or Tdap) 09/06/2023   MAMMOGRAM  10/24/2023   INFLUENZA VACCINE  03/14/2024 (Originally 07/16/2023)   Cervical Cancer Screening (HPV/Pap Cotest)  11/05/2026   Hepatitis C Screening  Completed   HIV Screening  Completed   HPV VACCINES  Aged Out     Referred for colonoscopy     See Problem List for Assessment and Plan of chronic medical problems.

## 2024-01-25 NOTE — Patient Instructions (Addendum)
Blood work was ordered.       Medications changes include :   None     Return in about 6 months (around 07/25/2024) for follow up.    Health Maintenance, Female Adopting a healthy lifestyle and getting preventive care are important in promoting health and wellness. Ask your health care provider about: The right schedule for you to have regular tests and exams. Things you can do on your own to prevent diseases and keep yourself healthy. What should I know about diet, weight, and exercise? Eat a healthy diet  Eat a diet that includes plenty of vegetables, fruits, low-fat dairy products, and lean protein. Do not eat a lot of foods that are high in solid fats, added sugars, or sodium. Maintain a healthy weight Body mass index (BMI) is used to identify weight problems. It estimates body fat based on height and weight. Your health care provider can help determine your BMI and help you achieve or maintain a healthy weight. Get regular exercise Get regular exercise. This is one of the most important things you can do for your health. Most adults should: Exercise for at least 150 minutes each week. The exercise should increase your heart rate and make you sweat (moderate-intensity exercise). Do strengthening exercises at least twice a week. This is in addition to the moderate-intensity exercise. Spend less time sitting. Even light physical activity can be beneficial. Watch cholesterol and blood lipids Have your blood tested for lipids and cholesterol at 48 years of age, then have this test every 5 years. Have your cholesterol levels checked more often if: Your lipid or cholesterol levels are high. You are older than 48 years of age. You are at high risk for heart disease. What should I know about cancer screening? Depending on your health history and family history, you may need to have cancer screening at various ages. This may include screening for: Breast cancer. Cervical  cancer. Colorectal cancer. Skin cancer. Lung cancer. What should I know about heart disease, diabetes, and high blood pressure? Blood pressure and heart disease High blood pressure causes heart disease and increases the risk of stroke. This is more likely to develop in people who have high blood pressure readings or are overweight. Have your blood pressure checked: Every 3-5 years if you are 58-105 years of age. Every year if you are 75 years old or older. Diabetes Have regular diabetes screenings. This checks your fasting blood sugar level. Have the screening done: Once every three years after age 53 if you are at a normal weight and have a low risk for diabetes. More often and at a younger age if you are overweight or have a high risk for diabetes. What should I know about preventing infection? Hepatitis B If you have a higher risk for hepatitis B, you should be screened for this virus. Talk with your health care provider to find out if you are at risk for hepatitis B infection. Hepatitis C Testing is recommended for: Everyone born from [birth year] through 1965. Anyone with known risk factors for hepatitis C. Sexually transmitted infections (STIs) Get screened for STIs, including gonorrhea and chlamydia, if: You are sexually active and are younger than 48 years of age. You are older than 48 years of age and your health care provider tells you that you are at risk for this type of infection. Your sexual activity has changed since you were last screened, and you are at increased risk for chlamydia or  gonorrhea. Ask your health care provider if you are at risk. Ask your health care provider about whether you are at high risk for HIV. Your health care provider may recommend a prescription medicine to help prevent HIV infection. If you choose to take medicine to prevent HIV, you should first get tested for HIV. You should then be tested every 3 months for as long as you are taking the  medicine. Pregnancy If you are about to stop having your period (premenopausal) and you may become pregnant, seek counseling before you get pregnant. Take 400 to 800 micrograms (mcg) of folic acid every day if you become pregnant. Ask for birth control (contraception) if you want to prevent pregnancy. Osteoporosis and menopause Osteoporosis is a disease in which the bones lose minerals and strength with aging. This can result in bone fractures. If you are 56 years old or older, or if you are at risk for osteoporosis and fractures, ask your health care provider if you should: Be screened for bone loss. Take a calcium or vitamin D supplement to lower your risk of fractures. Be given hormone replacement therapy (HRT) to treat symptoms of menopause. Follow these instructions at home: Alcohol use Do not drink alcohol if: Your health care provider tells you not to drink. You are pregnant, may be pregnant, or are planning to become pregnant. If you drink alcohol: Limit how much you have to: 0-1 drink a day. Know how much alcohol is in your drink. In the [place] one drink equals one 12 oz bottle of beer (355 mL), one 5 oz glass of wine (148 mL), or one 1 oz glass of hard liquor (44 mL). Lifestyle Do not use any products that contain nicotine or tobacco. These products include cigarettes, chewing tobacco, and vaping devices, such as e-cigarettes. If you need help quitting, ask your health care provider. Do not use street drugs. Do not share needles. Ask your health care provider for help if you need support or information about quitting drugs. General instructions Schedule regular health, dental, and eye exams. Stay current with your vaccines. Tell your health care provider if: You often feel depressed. You have ever been abused or do not feel safe at home. Summary Adopting a healthy lifestyle and getting preventive care are important in promoting health and wellness. Follow your health care  provider's instructions about healthy diet, exercising, and getting tested or screened for diseases. Follow your health care provider's instructions on monitoring your cholesterol and blood pressure. This information is not intended to replace advice given to you by your health care provider. Make sure you discuss any questions you have with your health care provider. Document Revised: 04/22/2021 Document Reviewed: 04/22/2021 Elsevier Patient Education  2024 [employer].

## 2024-01-26 ENCOUNTER — Encounter: Payer: Self-pay | Admitting: Internal Medicine

## 2024-01-26 ENCOUNTER — Ambulatory Visit (INDEPENDENT_AMBULATORY_CARE_PROVIDER_SITE_OTHER): Payer: Commercial Managed Care - PPO | Admitting: Internal Medicine

## 2024-01-26 VITALS — BP 122/74 | HR 73 | Temp 98.3°F | Ht 65.0 in | Wt 229.0 lb

## 2024-01-26 DIAGNOSIS — E782 Mixed hyperlipidemia: Secondary | ICD-10-CM

## 2024-01-26 DIAGNOSIS — R7303 Prediabetes: Secondary | ICD-10-CM

## 2024-01-26 DIAGNOSIS — Z0000 Encounter for general adult medical examination without abnormal findings: Secondary | ICD-10-CM | POA: Diagnosis not present

## 2024-01-26 DIAGNOSIS — Z23 Encounter for immunization: Secondary | ICD-10-CM

## 2024-01-26 DIAGNOSIS — Z1211 Encounter for screening for malignant neoplasm of colon: Secondary | ICD-10-CM

## 2024-01-26 DIAGNOSIS — F419 Anxiety disorder, unspecified: Secondary | ICD-10-CM | POA: Diagnosis not present

## 2024-01-26 DIAGNOSIS — E669 Obesity, unspecified: Secondary | ICD-10-CM | POA: Insufficient documentation

## 2024-01-26 DIAGNOSIS — R0683 Snoring: Secondary | ICD-10-CM

## 2024-01-26 NOTE — Assessment & Plan Note (Signed)
Chronic Check a1c Low sugar / carb diet Encouraged increasing her exercise is much as possible  Lab Results  Component Value Date   HGBA1C 5.4 07/28/2023   HGBA1C 5.4 07/28/2023   HGBA1C 5.4 07/28/2023   HGBA1C 5.4 07/28/2023

## 2024-01-26 NOTE — Assessment & Plan Note (Signed)
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

## 2024-01-26 NOTE — Assessment & Plan Note (Signed)
New Her husband presents with her weight gain she has started snoring and she was concerned about the possibility of sleep apnea At this point she would like to work on her weight loss before considering evaluation or treatment

## 2024-01-27 LAB — LIPID PANEL
Cholesterol: 216 mg/dL — ABNORMAL HIGH (ref 0–200)
HDL: 43.3 mg/dL (ref >39.00)
LDL Cholesterol: 133 mg/dL — ABNORMAL HIGH (ref 0–99)
NonHDL: 172.39
Total CHOL/HDL Ratio: 5
Triglycerides: 197 mg/dL — ABNORMAL HIGH (ref 0.0–149.0)
VLDL: 39.4 mg/dL (ref 0.0–40.0)

## 2024-01-27 LAB — COMPREHENSIVE METABOLIC PANEL
ALT: 19 U/L (ref 0–35)
AST: 19 U/L (ref 0–37)
Albumin: 4 g/dL (ref 3.5–5.2)
Alkaline Phosphatase: 74 U/L (ref 39–117)
BUN: 23 mg/dL (ref 6–23)
CO2: 25 meq/L (ref 19–32)
Calcium: 8.8 mg/dL (ref 8.4–10.5)
Chloride: 104 meq/L (ref 96–112)
Creatinine, Ser: 0.8 mg/dL (ref 0.40–1.20)
GFR: 87.88 mL/min (ref >60.00)
Glucose, Bld: 86 mg/dL (ref 70–99)
Potassium: 3.8 meq/L (ref 3.5–5.1)
Sodium: 138 meq/L (ref 135–145)
Total Bilirubin: 0.3 mg/dL (ref 0.2–1.2)
Total Protein: 7 g/dL (ref 6.0–8.3)

## 2024-01-27 LAB — CBC WITH DIFFERENTIAL/PLATELET
Basophils Absolute: 0 10*3/uL (ref 0.0–0.1)
Basophils Relative: 0.4 % (ref 0.0–3.0)
Eosinophils Absolute: 0.2 10*3/uL (ref 0.0–0.7)
Eosinophils Relative: 2.4 % (ref 0.0–5.0)
HCT: 43.1 % (ref 36.0–46.0)
Hemoglobin: 14.4 g/dL (ref 12.0–15.0)
Lymphocytes Relative: 21.4 % (ref 12.0–46.0)
Lymphs Abs: 1.5 10*3/uL (ref 0.7–4.0)
MCHC: 33.4 g/dL (ref 30.0–36.0)
MCV: 88 fL (ref 78.0–100.0)
Monocytes Absolute: 0.5 10*3/uL (ref 0.1–1.0)
Monocytes Relative: 6.9 % (ref 3.0–12.0)
Neutro Abs: 4.8 10*3/uL (ref 1.4–7.7)
Neutrophils Relative %: 68.9 % (ref 43.0–77.0)
Platelets: 224 10*3/uL (ref 150.0–400.0)
RBC: 4.9 Mil/uL (ref 3.87–5.11)
RDW: 13.7 % (ref 11.5–15.5)
WBC: 6.9 10*3/uL (ref 4.0–10.5)

## 2024-01-27 LAB — HEMOGLOBIN A1C: Hgb A1c MFr Bld: 5.7 % (ref 4.6–6.5)

## 2024-01-27 LAB — TSH: TSH: 1.3 u[IU]/mL (ref 0.35–5.50)

## 2024-01-28 ENCOUNTER — Encounter: Payer: Self-pay | Admitting: Internal Medicine

## 2024-03-02 ENCOUNTER — Other Ambulatory Visit: Payer: Self-pay | Admitting: Internal Medicine

## 2024-03-09 ENCOUNTER — Other Ambulatory Visit: Payer: Self-pay | Admitting: Internal Medicine

## 2024-03-09 DIAGNOSIS — Z0000 Encounter for general adult medical examination without abnormal findings: Secondary | ICD-10-CM

## 2024-03-29 ENCOUNTER — Ambulatory Visit
Admission: RE | Admit: 2024-03-29 | Discharge: 2024-03-29 | Disposition: A | Discharge status: Home / Self Care | Source: Ambulatory Visit | Attending: Internal Medicine | Admitting: Internal Medicine

## 2024-03-29 DIAGNOSIS — Z0000 Encounter for general adult medical examination without abnormal findings: Secondary | ICD-10-CM

## 2024-04-01 ENCOUNTER — Other Ambulatory Visit: Payer: Self-pay | Admitting: Internal Medicine

## 2024-04-01 DIAGNOSIS — R928 Other abnormal and inconclusive findings on diagnostic imaging of breast: Secondary | ICD-10-CM

## 2024-04-19 ENCOUNTER — Ambulatory Visit
Admission: RE | Admit: 2024-04-19 | Discharge: 2024-04-19 | Disposition: A | Discharge status: Home / Self Care | Source: Ambulatory Visit | Attending: Internal Medicine | Admitting: Internal Medicine

## 2024-04-19 DIAGNOSIS — R928 Other abnormal and inconclusive findings on diagnostic imaging of breast: Secondary | ICD-10-CM

## 2024-07-24 ENCOUNTER — Encounter: Payer: Self-pay | Admitting: Internal Medicine

## 2024-07-24 NOTE — Progress Notes (Signed)
 Subjective:    Patient ID: Rhonda Adams, female    DOB: 12-17-1975, 48 y.o.   MRN: [id number]     HPI Rhonda Adams is here for follow up of her chronic medical problems.  Has been eating more carbs this summer.      Medications and allergies reviewed with patient and updated if appropriate.  Current Outpatient Medications on File Prior to Visit  Medication Sig Dispense Refill   Ascorbic Acid (VITAMIN C) 100 MG tablet Take 100 mg by mouth daily.     aspirin  EC 81 MG tablet Take 81 mg by mouth daily.     cholecalciferol (VITAMIN D3) 25 MCG (1000 UT) tablet Take 1,000 Units by mouth daily.     FLUoxetine  (PROZAC ) 20 MG capsule TAKE 1 CAPSULE BY MOUTH DAILY 90 capsule 3   ibuprofen  (ADVIL ,MOTRIN ) 200 MG tablet Take 400 mg by mouth every 6 (six) hours as needed for headache or moderate pain.     levonorgestrel  (MIRENA , 52 MG,) 20 MCG/DAY IUD 1 each by Intrauterine route once.     Multiple Vitamins-Minerals (MULTIVITAMIN WITH MINERALS) tablet Take 1 tablet by mouth daily.     No current facility-administered medications on file prior to visit.     Review of Systems  Respiratory:  Positive for shortness of breath (with strenuous acitivity).   Cardiovascular:  Negative for chest pain, palpitations and leg swelling.  Neurological:  Negative for light-headedness and headaches.       Objective:   Vitals:   07/25/24 0903  BP: 124/76  Pulse: 82  Temp: 98.2 F (36.8 C)  SpO2: 96%   BP Readings from Last 3 Encounters:  07/25/24 124/76  01/26/24 122/74  07/28/23 124/76   Wt Readings from Last 3 Encounters:  07/25/24 236 lb (107 kg)  01/26/24 229 lb (103.9 kg)  07/28/23 217 lb (98.4 kg)   Body mass index is 39.27 kg/m.    Physical Exam Constitutional:      General: She is not in acute distress.    Appearance: Normal appearance.  HENT:     Head: Normocephalic and atraumatic.  Eyes:     Conjunctiva/sclera: Conjunctivae normal.  Cardiovascular:     Rate and Rhythm:  Normal rate and regular rhythm.     Heart sounds: Normal heart sounds.  Pulmonary:     Effort: Pulmonary effort is normal. No respiratory distress.     Breath sounds: Normal breath sounds. No wheezing.  Musculoskeletal:     Cervical back: Neck supple.     Right lower leg: No edema.     Left lower leg: No edema.  Lymphadenopathy:     Cervical: No cervical adenopathy.  Skin:    General: Skin is warm and dry.     Findings: No rash.  Neurological:     Mental Status: She is alert. Mental status is at baseline.  Psychiatric:        Mood and Affect: Mood normal.        Behavior: Behavior normal.        Lab Results  Component Value Date   WBC 6.9 01/26/2024   HGB 14.4 01/26/2024   HCT 43.1 01/26/2024   PLT 224.0 01/26/2024   GLUCOSE 86 01/26/2024   CHOL 216 (H) 01/26/2024   TRIG 197.0 (H) 01/26/2024   HDL 43.30 01/26/2024   LDLCALC 133 (H) 01/26/2024   ALT 19 01/26/2024   AST 19 01/26/2024   NA 138 01/26/2024   K 3.8 01/26/2024  CL 104 01/26/2024   CREATININE 0.80 01/26/2024   BUN 23 01/26/2024   CO2 25 01/26/2024   TSH 1.30 01/26/2024   INR 1.13 09/04/2013   HGBA1C 5.7 01/26/2024     Assessment & Plan:    See Problem List for Assessment and Plan of chronic medical problems.

## 2024-07-24 NOTE — Patient Instructions (Addendum)
      Blood work was ordered.       Medications changes include :   increase mounjaro  to 5 mg weekly      Return in about 6 months (around 01/25/2025) for Physical Exam.

## 2024-07-25 ENCOUNTER — Ambulatory Visit: Payer: Commercial Managed Care - PPO | Admitting: Internal Medicine

## 2024-07-25 VITALS — BP 124/76 | HR 82 | Temp 98.2°F | Ht 65.0 in | Wt 236.0 lb

## 2024-07-25 DIAGNOSIS — F419 Anxiety disorder, unspecified: Secondary | ICD-10-CM | POA: Diagnosis not present

## 2024-07-25 DIAGNOSIS — R7303 Prediabetes: Secondary | ICD-10-CM | POA: Diagnosis not present

## 2024-07-25 LAB — POCT GLYCOSYLATED HEMOGLOBIN (HGB A1C)
HbA1c POC (<> result, manual entry): 5.7 % (ref 4.0–5.6)
HbA1c, POC (controlled diabetic range): 5.7 % (ref 0.0–7.0)
HbA1c, POC (prediabetic range): 5.7 % (ref 5.7–6.4)
Hemoglobin A1C: 5.7 % — AB (ref 4.0–5.6)

## 2024-07-25 NOTE — Assessment & Plan Note (Signed)
 Chronic Improved, controlled Continue fluoxetine  20 mg daily

## 2024-07-25 NOTE — Assessment & Plan Note (Addendum)
 Chronic Check A1c - 5.7% Low sugar / carb diet Encouraged increasing her exercise is much as possible  Lab Results  Component Value Date   HGBA1C 5.7 01/26/2024

## 2025-01-30 ENCOUNTER — Encounter: Admitting: Internal Medicine

## 2025-04-04 ENCOUNTER — Encounter: Admitting: Internal Medicine
